# Patient Record
Sex: Female | Born: 1975 | Race: Black or African American | Hispanic: No | Marital: Single | Smoking: Current every day smoker
Health system: Southern US, Community
[De-identification: ages and names within clinical notes are randomized; demographics above are authoritative.]

## PROBLEM LIST (undated history)

## (undated) DIAGNOSIS — C801 Malignant (primary) neoplasm, unspecified: Secondary | ICD-10-CM

## (undated) DIAGNOSIS — G479 Sleep disorder, unspecified: Secondary | ICD-10-CM

## (undated) DIAGNOSIS — K259 Gastric ulcer, unspecified as acute or chronic, without hemorrhage or perforation: Secondary | ICD-10-CM

## (undated) DIAGNOSIS — Z9289 Personal history of other medical treatment: Secondary | ICD-10-CM

## (undated) DIAGNOSIS — J449 Chronic obstructive pulmonary disease, unspecified: Secondary | ICD-10-CM

## (undated) DIAGNOSIS — R200 Anesthesia of skin: Secondary | ICD-10-CM

## (undated) DIAGNOSIS — F32A Depression, unspecified: Secondary | ICD-10-CM

## (undated) DIAGNOSIS — IMO0001 Reserved for inherently not codable concepts without codable children: Secondary | ICD-10-CM

## (undated) DIAGNOSIS — Z8489 Family history of other specified conditions: Secondary | ICD-10-CM

## (undated) DIAGNOSIS — D649 Anemia, unspecified: Secondary | ICD-10-CM

## (undated) DIAGNOSIS — F329 Major depressive disorder, single episode, unspecified: Secondary | ICD-10-CM

## (undated) DIAGNOSIS — J45909 Unspecified asthma, uncomplicated: Secondary | ICD-10-CM

## (undated) DIAGNOSIS — Z9889 Other specified postprocedural states: Secondary | ICD-10-CM

## (undated) DIAGNOSIS — R112 Nausea with vomiting, unspecified: Secondary | ICD-10-CM

## (undated) DIAGNOSIS — R202 Paresthesia of skin: Secondary | ICD-10-CM

## (undated) HISTORY — PX: DILATION AND CURETTAGE OF UTERUS: SHX78

## (undated) HISTORY — PX: TONSILLECTOMY: SUR1361

## (undated) HISTORY — PX: CERVICAL CONE BIOPSY: SUR198

## (undated) HISTORY — PX: LYMPHADENECTOMY: SHX5960

## (undated) HISTORY — PX: SHOULDER SURGERY: SHX246

---
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
## 1999-05-29 ENCOUNTER — Inpatient Hospital Stay (HOSPITAL_COMMUNITY): Admission: AD | Admit: 1999-05-29 | Discharge: 1999-05-29 | Payer: Self-pay | Admitting: *Deleted

## 1999-09-12 ENCOUNTER — Emergency Department (HOSPITAL_COMMUNITY): Admission: EM | Admit: 1999-09-12 | Discharge: 1999-09-12 | Payer: Self-pay | Admitting: Emergency Medicine

## 1999-09-14 ENCOUNTER — Emergency Department (HOSPITAL_COMMUNITY): Admission: EM | Admit: 1999-09-14 | Discharge: 1999-09-14 | Payer: Self-pay | Admitting: Emergency Medicine

## 1999-09-15 ENCOUNTER — Encounter: Payer: Self-pay | Admitting: Emergency Medicine

## 1999-09-28 ENCOUNTER — Emergency Department (HOSPITAL_COMMUNITY): Admission: EM | Admit: 1999-09-28 | Discharge: 1999-09-28 | Payer: Self-pay | Admitting: Emergency Medicine

## 2000-06-07 ENCOUNTER — Emergency Department (HOSPITAL_COMMUNITY): Admission: EM | Admit: 2000-06-07 | Discharge: 2000-06-07 | Payer: Self-pay | Admitting: Emergency Medicine

## 2005-07-30 ENCOUNTER — Emergency Department (HOSPITAL_COMMUNITY): Admission: EM | Admit: 2005-07-30 | Discharge: 2005-07-31 | Payer: Self-pay | Admitting: Emergency Medicine

## 2007-07-04 HISTORY — PX: CHOLECYSTECTOMY: SHX55

## 2011-07-11 DIAGNOSIS — J45909 Unspecified asthma, uncomplicated: Secondary | ICD-10-CM | POA: Diagnosis not present

## 2011-07-11 DIAGNOSIS — R1110 Vomiting, unspecified: Secondary | ICD-10-CM | POA: Diagnosis not present

## 2012-04-26 DIAGNOSIS — F17200 Nicotine dependence, unspecified, uncomplicated: Secondary | ICD-10-CM | POA: Diagnosis not present

## 2012-04-26 DIAGNOSIS — R51 Headache: Secondary | ICD-10-CM | POA: Diagnosis not present

## 2012-06-27 DIAGNOSIS — M546 Pain in thoracic spine: Secondary | ICD-10-CM | POA: Diagnosis not present

## 2012-06-27 DIAGNOSIS — J45909 Unspecified asthma, uncomplicated: Secondary | ICD-10-CM | POA: Diagnosis not present

## 2012-06-27 DIAGNOSIS — M549 Dorsalgia, unspecified: Secondary | ICD-10-CM | POA: Diagnosis not present

## 2012-07-15 DIAGNOSIS — J45909 Unspecified asthma, uncomplicated: Secondary | ICD-10-CM | POA: Diagnosis not present

## 2012-07-15 DIAGNOSIS — F17200 Nicotine dependence, unspecified, uncomplicated: Secondary | ICD-10-CM | POA: Diagnosis not present

## 2012-07-15 DIAGNOSIS — J441 Chronic obstructive pulmonary disease with (acute) exacerbation: Secondary | ICD-10-CM | POA: Diagnosis not present

## 2012-09-26 DIAGNOSIS — K5730 Diverticulosis of large intestine without perforation or abscess without bleeding: Secondary | ICD-10-CM | POA: Diagnosis not present

## 2012-09-26 DIAGNOSIS — K5732 Diverticulitis of large intestine without perforation or abscess without bleeding: Secondary | ICD-10-CM | POA: Diagnosis not present

## 2012-09-26 DIAGNOSIS — J45909 Unspecified asthma, uncomplicated: Secondary | ICD-10-CM | POA: Diagnosis not present

## 2012-10-01 DIAGNOSIS — D649 Anemia, unspecified: Secondary | ICD-10-CM | POA: Diagnosis not present

## 2012-10-01 DIAGNOSIS — Z888 Allergy status to other drugs, medicaments and biological substances status: Secondary | ICD-10-CM | POA: Diagnosis not present

## 2012-10-01 DIAGNOSIS — J45909 Unspecified asthma, uncomplicated: Secondary | ICD-10-CM | POA: Diagnosis not present

## 2012-10-01 DIAGNOSIS — R112 Nausea with vomiting, unspecified: Secondary | ICD-10-CM | POA: Diagnosis not present

## 2012-10-01 DIAGNOSIS — R143 Flatulence: Secondary | ICD-10-CM | POA: Diagnosis not present

## 2012-10-01 DIAGNOSIS — Z8541 Personal history of malignant neoplasm of cervix uteri: Secondary | ICD-10-CM | POA: Diagnosis not present

## 2012-10-01 DIAGNOSIS — F17200 Nicotine dependence, unspecified, uncomplicated: Secondary | ICD-10-CM | POA: Diagnosis not present

## 2012-10-01 DIAGNOSIS — N83209 Unspecified ovarian cyst, unspecified side: Secondary | ICD-10-CM | POA: Diagnosis not present

## 2012-10-01 DIAGNOSIS — R141 Gas pain: Secondary | ICD-10-CM | POA: Diagnosis not present

## 2012-10-01 DIAGNOSIS — K5730 Diverticulosis of large intestine without perforation or abscess without bleeding: Secondary | ICD-10-CM | POA: Diagnosis not present

## 2012-12-05 DIAGNOSIS — Z0489 Encounter for examination and observation for other specified reasons: Secondary | ICD-10-CM | POA: Diagnosis not present

## 2012-12-05 DIAGNOSIS — J45909 Unspecified asthma, uncomplicated: Secondary | ICD-10-CM | POA: Diagnosis not present

## 2012-12-05 DIAGNOSIS — S139XXA Sprain of joints and ligaments of unspecified parts of neck, initial encounter: Secondary | ICD-10-CM | POA: Diagnosis not present

## 2012-12-05 DIAGNOSIS — M542 Cervicalgia: Secondary | ICD-10-CM | POA: Diagnosis not present

## 2012-12-06 DIAGNOSIS — Z0489 Encounter for examination and observation for other specified reasons: Secondary | ICD-10-CM | POA: Diagnosis not present

## 2012-12-16 DIAGNOSIS — M79609 Pain in unspecified limb: Secondary | ICD-10-CM | POA: Diagnosis not present

## 2012-12-16 DIAGNOSIS — F17200 Nicotine dependence, unspecified, uncomplicated: Secondary | ICD-10-CM | POA: Diagnosis not present

## 2012-12-16 DIAGNOSIS — Z79899 Other long term (current) drug therapy: Secondary | ICD-10-CM | POA: Diagnosis not present

## 2012-12-16 DIAGNOSIS — R0609 Other forms of dyspnea: Secondary | ICD-10-CM | POA: Diagnosis not present

## 2012-12-16 DIAGNOSIS — R062 Wheezing: Secondary | ICD-10-CM | POA: Diagnosis not present

## 2012-12-16 DIAGNOSIS — T7411XA Adult physical abuse, confirmed, initial encounter: Secondary | ICD-10-CM | POA: Diagnosis not present

## 2012-12-16 DIAGNOSIS — R0989 Other specified symptoms and signs involving the circulatory and respiratory systems: Secondary | ICD-10-CM | POA: Diagnosis not present

## 2012-12-16 DIAGNOSIS — Z8541 Personal history of malignant neoplasm of cervix uteri: Secondary | ICD-10-CM | POA: Diagnosis not present

## 2012-12-16 DIAGNOSIS — M25539 Pain in unspecified wrist: Secondary | ICD-10-CM | POA: Diagnosis not present

## 2012-12-16 DIAGNOSIS — J45901 Unspecified asthma with (acute) exacerbation: Secondary | ICD-10-CM | POA: Diagnosis not present

## 2012-12-24 DIAGNOSIS — J45909 Unspecified asthma, uncomplicated: Secondary | ICD-10-CM | POA: Diagnosis not present

## 2012-12-24 DIAGNOSIS — D509 Iron deficiency anemia, unspecified: Secondary | ICD-10-CM | POA: Diagnosis not present

## 2012-12-24 DIAGNOSIS — D649 Anemia, unspecified: Secondary | ICD-10-CM | POA: Diagnosis not present

## 2013-02-26 ENCOUNTER — Emergency Department (HOSPITAL_COMMUNITY)
Admission: EM | Admit: 2013-02-26 | Discharge: 2013-02-26 | Disposition: A | Discharge status: Home / Self Care | Payer: Medicare Other | Attending: Emergency Medicine | Admitting: Emergency Medicine

## 2013-02-26 ENCOUNTER — Encounter (HOSPITAL_COMMUNITY): Payer: Self-pay | Admitting: Emergency Medicine

## 2013-02-26 DIAGNOSIS — F17200 Nicotine dependence, unspecified, uncomplicated: Secondary | ICD-10-CM | POA: Insufficient documentation

## 2013-02-26 DIAGNOSIS — R209 Unspecified disturbances of skin sensation: Secondary | ICD-10-CM | POA: Diagnosis not present

## 2013-02-26 DIAGNOSIS — L819 Disorder of pigmentation, unspecified: Secondary | ICD-10-CM | POA: Insufficient documentation

## 2013-02-26 DIAGNOSIS — Z79899 Other long term (current) drug therapy: Secondary | ICD-10-CM | POA: Insufficient documentation

## 2013-02-26 DIAGNOSIS — J45909 Unspecified asthma, uncomplicated: Secondary | ICD-10-CM | POA: Insufficient documentation

## 2013-02-26 DIAGNOSIS — M79609 Pain in unspecified limb: Secondary | ICD-10-CM | POA: Insufficient documentation

## 2013-02-26 DIAGNOSIS — Z880 Allergy status to penicillin: Secondary | ICD-10-CM | POA: Insufficient documentation

## 2013-02-26 DIAGNOSIS — M79606 Pain in leg, unspecified: Secondary | ICD-10-CM

## 2013-02-26 HISTORY — DX: Personal history of other medical treatment: Z92.89

## 2013-02-26 HISTORY — DX: Malignant (primary) neoplasm, unspecified: C80.1

## 2013-02-26 HISTORY — DX: Chronic obstructive pulmonary disease, unspecified: J44.9

## 2013-02-26 HISTORY — DX: Anemia, unspecified: D64.9

## 2013-02-26 HISTORY — DX: Unspecified asthma, uncomplicated: J45.909

## 2013-02-26 HISTORY — DX: Gastric ulcer, unspecified as acute or chronic, without hemorrhage or perforation: K25.9

## 2013-02-26 LAB — CBC WITH DIFFERENTIAL/PLATELET
Eosinophils Absolute: 0.2 10*3/uL (ref 0.0–0.7)
Eosinophils Relative: 3 % (ref 0–5)
HCT: 28.6 % — ABNORMAL LOW (ref 36.0–46.0)
Hemoglobin: 8.5 g/dL — ABNORMAL LOW (ref 12.0–15.0)
Lymphs Abs: 2.7 10*3/uL (ref 0.7–4.0)
MCHC: 29.7 g/dL — ABNORMAL LOW (ref 30.0–36.0)
Monocytes Relative: 8 % (ref 3–12)
Neutro Abs: 3.3 10*3/uL (ref 1.7–7.7)

## 2013-02-26 LAB — BASIC METABOLIC PANEL
BUN: 7 mg/dL (ref 6–23)
CO2: 25 mEq/L (ref 19–32)
Calcium: 8.6 mg/dL (ref 8.4–10.5)
GFR calc Af Amer: >90 mL/min (ref >90)
Glucose, Bld: 94 mg/dL (ref 70–99)

## 2013-02-26 MED ORDER — OXYCODONE HCL 5 MG PO TABS: 5.0000 mg | ORAL_TABLET | ORAL | Status: DC | PRN

## 2013-02-26 MED ORDER — OXYCODONE HCL 5 MG PO TABS
5.0000 mg | ORAL_TABLET | Freq: Once | ORAL | Status: AC
  Administered 2013-02-26: 5 mg via ORAL
  Filled 2013-02-26: qty 1

## 2013-02-26 NOTE — ED Notes (Signed)
PA @ bedside.

## 2013-02-26 NOTE — ED Notes (Signed)
Pt c/o L upper leg pain and bruising x 2 weeks with numbness and tingling. Pt denies injury to extremity. Pt states R thigh now has discolored area. A & O, NAD

## 2013-02-26 NOTE — ED Provider Notes (Signed)
CSN: [id number]     Arrival date & time 02/26/13  2019 History   First MD Initiated Contact with Patient 02/26/13 2117     Chief Complaint  Patient presents with  . Leg Pain   (Consider location/radiation/quality/duration/timing/severity/associated sxs/prior Treatment) The history is provided by medical records and the patient.   Pt presents to the ED for left thigh skin darkening and intermittent, bilateral LE paresthesias x 2 days.  Pt denies any injury, trauma, or falls.  Pt states she noticed the color change when putting on her pants and thinks it is now spreading to her right leg.  Notes only her left anterior thigh is somewhat TTP.  No LE swelling, states leg actually looks "smaller" than normal.  No difficulty weight bearing or ambulating.  No rashes, fevers, sweats, or chills.  No back pain or loss of bowel/bladder function.  Denies any chest pain, SOB, N/V/D.  Pt states she has a hx of anemia requiring blood transfusion.  Past Medical History  Diagnosis Date  . Asthma   . Cancer     cervical  . Anemia   . History of blood transfusion   . Heart valve disorder   . COPD (chronic obstructive pulmonary disease)   . Gastric ulcer     x 5   Past Surgical History  Procedure Laterality Date  . Appendectomy    . Cholecystectomy    . Tonsillectomy    . Cervical cone biopsy      x 6  . Cesarean section      x 2  . Shoulder surgery Left    No family history on file. History  Substance Use Topics  . Smoking status: Current Every Day Smoker  . Smokeless tobacco: Not on file  . Alcohol Use: Yes     Comment: occasional   OB History   Grav Para Term Preterm Abortions TAB SAB Ect Mult Living                 Review of Systems  Musculoskeletal: Positive for myalgias.  Skin: Positive for color change.  All other systems reviewed and are negative.    Allergies  Acetaminophen; Doxycycline; Erythromycin; Motrin; Other; and Penicillins  Home Medications   Current  Outpatient Rx  Name  Route  Sig  Dispense  Refill  . albuterol (PROVENTIL HFA;VENTOLIN HFA) 108 (90 BASE) MCG/ACT inhaler   Inhalation   Inhale 2 puffs into the lungs every 6 (six) hours as needed for wheezing.         Marland Kitchen albuterol (PROVENTIL) (2.5 MG/3ML) 0.083% nebulizer solution   Nebulization   Take 2.5 mg by nebulization every 6 (six) hours as needed for wheezing.          BP 126/73  Pulse 93  Temp(Src) 98.7 F (37.1 C) (Oral)  Resp 16  Ht 5\' 6"  (1.676 m)  Wt 171 lb 4 oz (77.678 kg)  BMI 27.65 kg/m2  SpO2 99%  LMP 02/22/2013  Physical Exam  Nursing note and vitals reviewed. Constitutional: She is oriented to person, place, and time. She appears well-developed and well-nourished.  HENT:  Head: Normocephalic and atraumatic.  Eyes: Conjunctivae and EOM are normal. Pupils are equal, round, and reactive to light.  Neck: Normal range of motion. Neck supple.  Cardiovascular: Normal rate, regular rhythm, normal heart sounds, intact distal pulses and normal pulses.   Pulmonary/Chest: Effort normal and breath sounds normal.  Musculoskeletal: Normal range of motion.  Hyperpigmentation of left anterior thigh, mild  TTP; strong distal pulses bilaterally; no asymmetry or palpable cord; negative Homan's sign; normal pre-patellar reflexes bilaterally; distal sensation intact  Neurological: She is alert and oriented to person, place, and time. She has normal strength and normal reflexes. She displays no tremor. No cranial nerve deficit or sensory deficit. She displays no seizure activity. Gait normal.  CN grossly intact, ambulating appropriately, normal prepatellar reflexes bilaterally, no focal deficits appreciated  Skin: Skin is warm and dry. No rash noted.  No rashes, lesions, petechia, or abscesses  Psychiatric: She has a normal mood and affect. Her speech is normal.    ED Course  Procedures (including critical care time) Labs Review Labs Reviewed  CBC WITH DIFFERENTIAL -  Abnormal; Notable for the following:    Hemoglobin 8.5 (*)    HCT 28.6 (*)    MCV 65.0 (*)    MCH 19.3 (*)    MCHC 29.7 (*)    RDW 19.9 (*)    All other components within normal limits  BASIC METABOLIC PANEL - Abnormal; Notable for the following:    Potassium 3.3 (*)    All other components within normal limits   Imaging Review No results found.  MDM   1. Hyperpigmentation of skin   2. Leg pain, anterior, unspecified laterality    Labs as above, H/H stable.  No back pain, LE neurovascularly intact, reflexes normal.  Doubt acute fx or dislocations.  Sx unlikely for DVT but will schedule for OP doppler in the morning as it is unavailable at this hour.  Rx oxycodone.  Pt does not have current PCP, will [facility] if no improvement in the next few days.  Given strict return precautions should sx change or worsen including numbness, gait disturbance, loss of bowel/bladder function, etc-- pt acknowledged understanding and agreed with plan.  Discussed with Dr. Blinda Leatherwood who agrees with plan.  Garlon Hatchet, PA-C 02/27/13 0006

## 2013-02-28 NOTE — ED Provider Notes (Signed)
Medical screening examination/treatment/procedure(s) were performed by non-physician practitioner and as supervising physician I was immediately available for consultation/collaboration.    Gilda Crease, MD 02/28/13 [phone]

## 2013-04-13 ENCOUNTER — Emergency Department (HOSPITAL_COMMUNITY)
Admission: EM | Admit: 2013-04-13 | Discharge: 2013-04-13 | Disposition: A | Discharge status: Home / Self Care | Payer: Medicare Other | Attending: Emergency Medicine | Admitting: Emergency Medicine

## 2013-04-13 ENCOUNTER — Emergency Department (HOSPITAL_COMMUNITY): Payer: Medicare Other

## 2013-04-13 ENCOUNTER — Encounter (HOSPITAL_COMMUNITY): Payer: Self-pay | Admitting: Emergency Medicine

## 2013-04-13 DIAGNOSIS — Z8719 Personal history of other diseases of the digestive system: Secondary | ICD-10-CM | POA: Insufficient documentation

## 2013-04-13 DIAGNOSIS — J4489 Other specified chronic obstructive pulmonary disease: Secondary | ICD-10-CM | POA: Insufficient documentation

## 2013-04-13 DIAGNOSIS — F17200 Nicotine dependence, unspecified, uncomplicated: Secondary | ICD-10-CM | POA: Diagnosis not present

## 2013-04-13 DIAGNOSIS — Z79899 Other long term (current) drug therapy: Secondary | ICD-10-CM | POA: Diagnosis not present

## 2013-04-13 DIAGNOSIS — Z880 Allergy status to penicillin: Secondary | ICD-10-CM | POA: Diagnosis not present

## 2013-04-13 DIAGNOSIS — G479 Sleep disorder, unspecified: Secondary | ICD-10-CM | POA: Insufficient documentation

## 2013-04-13 DIAGNOSIS — Z8541 Personal history of malignant neoplasm of cervix uteri: Secondary | ICD-10-CM | POA: Insufficient documentation

## 2013-04-13 DIAGNOSIS — Z862 Personal history of diseases of the blood and blood-forming organs and certain disorders involving the immune mechanism: Secondary | ICD-10-CM | POA: Diagnosis not present

## 2013-04-13 DIAGNOSIS — R5381 Other malaise: Secondary | ICD-10-CM | POA: Insufficient documentation

## 2013-04-13 DIAGNOSIS — J449 Chronic obstructive pulmonary disease, unspecified: Secondary | ICD-10-CM | POA: Insufficient documentation

## 2013-04-13 DIAGNOSIS — M542 Cervicalgia: Secondary | ICD-10-CM | POA: Insufficient documentation

## 2013-04-13 DIAGNOSIS — R202 Paresthesia of skin: Secondary | ICD-10-CM

## 2013-04-13 DIAGNOSIS — Z8679 Personal history of other diseases of the circulatory system: Secondary | ICD-10-CM | POA: Diagnosis not present

## 2013-04-13 DIAGNOSIS — R209 Unspecified disturbances of skin sensation: Secondary | ICD-10-CM | POA: Diagnosis not present

## 2013-04-13 DIAGNOSIS — R918 Other nonspecific abnormal finding of lung field: Secondary | ICD-10-CM | POA: Diagnosis not present

## 2013-04-13 LAB — CBC WITH DIFFERENTIAL/PLATELET
Basophils Absolute: 0 10*3/uL (ref 0.0–0.1)
Eosinophils Relative: 2 % (ref 0–5)
MCH: 19.3 pg — ABNORMAL LOW (ref 26.0–34.0)
Monocytes Relative: 9 % (ref 3–12)
Neutrophils Relative %: 49 % (ref 43–77)
Platelets: 138 10*3/uL — ABNORMAL LOW (ref 150–400)
WBC: 6.8 10*3/uL (ref 4.0–10.5)

## 2013-04-13 LAB — BASIC METABOLIC PANEL
CO2: 22 mEq/L (ref 19–32)
Calcium: 7.9 mg/dL — ABNORMAL LOW (ref 8.4–10.5)
Chloride: 105 mEq/L (ref 96–112)
Creatinine, Ser: 0.62 mg/dL (ref 0.50–1.10)

## 2013-04-13 MED ORDER — OXYCODONE HCL 5 MG PO TABS
5.0000 mg | ORAL_TABLET | Freq: Once | ORAL | Status: AC
  Administered 2013-04-13: 5 mg via ORAL
  Filled 2013-04-13: qty 1

## 2013-04-13 MED ORDER — OXYCODONE HCL 5 MG PO TABS: 5.0000 mg | ORAL_TABLET | ORAL | Status: DC | PRN

## 2013-04-13 NOTE — ED Notes (Addendum)
Pt c/o pain in left wrist and thumb area.  No known injury no swelling present.  + strong radial pulse.

## 2013-04-13 NOTE — ED Notes (Signed)
C/o L arm pain, no known injury, onset ~ 1 week ago, radiates down arm, reports fingers are tingling and fingers turn blue when the pain comes, pain is constant, but fluctuates. Radial ulnar and interphalangeal pusles present, hands equally cool bilaterally, mentions neck pain when she turns neck, some intermitent nausea.

## 2013-04-13 NOTE — Progress Notes (Signed)
Orthopedic Tech Progress Note Patient Details:  Melissa Alvarez 1975-12-29 [id number]  Ortho Devices Type of Ortho Device: Arm sling;Velcro wrist splint Ortho Device/Splint Location: lue Ortho Device/Splint Interventions: Application   Keil Pickering 04/13/2013, 10:52 PM

## 2013-04-13 NOTE — ED Provider Notes (Signed)
CSN: [id number]     Arrival date & time 04/13/13  1937 History  This chart was scribed for Rhea Bleacher, PA, working with Geoffery Lyons, MD by Blanchard Kelch, ED Scribe. This patient was seen in room TR10C/TR10C and the patient's care was started at 7:58 PM.    Chief Complaint  Patient presents with  . Arm Pain    Patient is a 37 y.o. female presenting with arm pain. The history is provided by the patient. No language interpreter was used.  Arm Pain Pertinent negatives include no chest pain, no headaches and no shortness of breath.     HPI Comments: Melissa Alvarez is a 37 y.o. female who presents to the Emergency Department complaining of gradual onset, waxing and waning left arm pain that began about a week ago. She states the pain started in her upper left arm but is now in the lower left arm and hand. She complains of mild neck pain and numbness and tingling in her left hand. She states that her fingers turn blue during episodes of pain. She has been using Tylenol, Motrin and Advil for the pain without relief. She has been unable to sleep due to the pain. She denies any injury to the affected area. She has a pertinent past medical history of five gastric ulcers. She denies a history of blood clots.   She is also complaining of spreading left thigh darkening with pain. She states that she gets intermittent weakness and numbness in her left leg when she walks. Her husband states she has difficulty lifting up or feeling her leg occasionally. These symptoms first began about a month ago. She was seen in the ED on 8/27 for the same symptoms but was discharged. She was also seen in an ED in [place] but was told the discoloration was just a bruise.  Pt denies having a PCP currently.  Past Medical History  Diagnosis Date  . Asthma   . Cancer     cervical  . Anemia   . History of blood transfusion   . Heart valve disorder   . COPD (chronic obstructive pulmonary disease)   . Gastric ulcer      x 5   Past Surgical History  Procedure Laterality Date  . Appendectomy    . Cholecystectomy    . Tonsillectomy    . Cervical cone biopsy      x 6  . Cesarean section      x 2  . Shoulder surgery Left    No family history on file. History  Substance Use Topics  . Smoking status: Current Every Day Smoker  . Smokeless tobacco: Not on file  . Alcohol Use: Yes     Comment: occasional   OB History   Grav Para Term Preterm Abortions TAB SAB Ect Mult Living                 Review of Systems  Constitutional: Negative for fever.  HENT: Negative for congestion, dental problem, rhinorrhea and sinus pressure.   Eyes: Negative for photophobia, discharge, redness and visual disturbance.  Respiratory: Negative for shortness of breath.   Cardiovascular: Negative for chest pain.  Gastrointestinal: Negative for nausea and vomiting.  Musculoskeletal: Positive for myalgias (left arm pain) and neck pain. Negative for gait problem and neck stiffness.  Skin: Negative for rash.  Neurological: Positive for weakness (left leg) and numbness (left hand, left leg). Negative for syncope, speech difficulty, light-headedness and headaches.  Psychiatric/Behavioral: Positive  for sleep disturbance. Negative for confusion.    Allergies  Acetaminophen; Doxycycline; Erythromycin; Motrin; Other; and Penicillins  Home Medications   Current Outpatient Rx  Name  Route  Sig  Dispense  Refill  . albuterol (PROVENTIL HFA;VENTOLIN HFA) 108 (90 BASE) MCG/ACT inhaler   Inhalation   Inhale 2 puffs into the lungs every 6 (six) hours as needed for wheezing.         Marland Kitchen albuterol (PROVENTIL) (2.5 MG/3ML) 0.083% nebulizer solution   Nebulization   Take 2.5 mg by nebulization every 6 (six) hours as needed for wheezing.         Marland Kitchen oxyCODONE (OXY IR/ROXICODONE) 5 MG immediate release tablet   Oral   Take 1 tablet (5 mg total) by mouth every 4 (four) hours as needed for pain.   10 tablet   0    Triage Vitals:  BP 131/64  Pulse 81  Temp(Src) 98 F (36.7 C) (Oral)  Resp 18  Ht 5\' 6"  (1.676 m)  Wt 170 lb (77.111 kg)  BMI 27.45 kg/m2  SpO2 98%  LMP 03/17/2013  Physical Exam  Nursing note and vitals reviewed. Constitutional: She is oriented to person, place, and time. She appears well-developed and well-nourished.  HENT:  Head: Normocephalic and atraumatic.  Right Ear: Tympanic membrane, external ear and ear canal normal.  Left Ear: Tympanic membrane, external ear and ear canal normal.  Nose: Nose normal.  Mouth/Throat: Uvula is midline, oropharynx is clear and moist and mucous membranes are normal.  Eyes: Conjunctivae, EOM and lids are normal. Pupils are equal, round, and reactive to light. Right eye exhibits no nystagmus. Left eye exhibits no nystagmus.  Neck: Normal range of motion. Neck supple.  There is a right cervical lymph node that is firm, mobile. Patient states this has been present for 2 weeks.  Cardiovascular: Normal rate and regular rhythm.   Pulmonary/Chest: Effort normal and breath sounds normal.  Abdominal: Soft. There is no tenderness.  Musculoskeletal:       Cervical back: She exhibits tenderness. She exhibits normal range of motion and no bony tenderness.  Positive Tinel's sign of left wrist. Patient very tender with any movement of left hand, especially thenar eminence. Her left hand and forearm are cool to the touch when compared to the right, however 2+ radial pulse noted in left wrist with normal capillary refill. No swelling. Hyperesthesia of left forearm. Patient complains of generalized tenderness of left shoulder. Slightly weak grip strength.  Lymphadenopathy:    She has cervical adenopathy.  Neurological: She is alert and oriented to person, place, and time. She has normal strength and normal reflexes. No cranial nerve deficit or sensory deficit. She displays a negative Romberg sign. Coordination and gait normal. GCS eye subscore is 4. GCS verbal subscore is 5. GCS  motor subscore is 6.  Skin: Skin is warm and dry.  Poorly defined area on anterior lateral left thigh of mild hyperpigmentation.  Psychiatric: She has a normal mood and affect.    ED Course  Procedures (including critical care time)  DIAGNOSTIC STUDIES: Oxygen Saturation is 98% on room air, normal by my interpretation.    COORDINATION OF CARE: 8:01 PM -Will consult with Dr. Judd Lien for treatment plan. Will order CBC, BMP and Cervical spine MRI without contrast. Patient requested for wrist to be splinted and a sling for the arm to decrease pain from motion. Patient verbalizes understanding and agrees with treatment plan.  Labs Review Labs Reviewed  CBC WITH DIFFERENTIAL -  Abnormal; Notable for the following:    Hemoglobin 9.3 (*)    HCT 30.3 (*)    MCV 63.0 (*)    MCH 19.3 (*)    RDW 19.5 (*)    Platelets 138 (*)    All other components within normal limits  BASIC METABOLIC PANEL - Abnormal; Notable for the following:    Potassium 3.3 (*)    Glucose, Bld 102 (*)    Calcium 7.9 (*)    All other components within normal limits   Imaging Review Dg Chest 2 View  04/13/2013   CLINICAL DATA:  Gradual onset of waxing and waning left arm pain that began 1 week ago, beginning in upper arm, But now in lower arm and hand. Mild neck pain. No chest complaints.  EXAM: CHEST  2 VIEW  COMPARISON:  Chest x-ray from [facility] 07/13/2007.  FINDINGS: The heart size and mediastinal contours are within normal limits. Both lungs are clear. The visualized skeletal structures are unremarkable. Prior cholecystectomy with right upper quadrant clips.  IMPRESSION: No active cardiopulmonary disease. No change from priors.   Electronically Signed   By: Davonna Belling M.D.   On: 04/13/2013 22:28    EKG Interpretation   None      Patient discussed with Dr. Judd Lien.   Basic labs and chest x-ray ordered. Unfortunately MRI cervical spine unable to be obtained tonight.  Patient seen by Dr. Judd Lien.  Will give pain medicine for home and orthopedic followup. Patient counseled to return with worsening symptoms, worsening weakness, inability to walk, severe neck pain, or she has any other concerns. Patient verbalizes understanding and agrees with plan.  Patient counseled on use of narcotic pain medications. Counseled not to combine these medications with others containing tylenol. Urged not to drink alcohol, drive, or perform any other activities that requires focus while taking these medications. The patient verbalizes understanding and agrees with the plan.     MDM   1. Left leg paresthesias   2. Paresthesias in left hand    Patient with unusual constellation of symptoms. Left leg numbness has been intermittent for several months. Over the past week she has developed left arm paresthesias and possible vasomotor symptoms. Thoracic outlet syndrome is a possibility. Chest x-ray does not show any rib deformities. Cervical radiculopathy is also on the differential. If it was possible, I would've obtained cervical spine MRI tonight to rule out cord etiology. However this is unable to be obtained and I do not feel that this needs to be done emergently. My suspicion for central cord etiology requiring emergent intervention is very very low. However this would need to be entertained especially since patient has lower extremity symptoms as well. At this point, I feel the patient can be discharged home with orthopedic followup. Referral given. Patient given sling and splint for pain control. Appropriate return instructions given.  Primary care referral also given. Uncertain significance of right cervical lymph node. This would ideally be followed by the patient's primary care physician. CBC with normal WBC count.  I personally performed the services described in this documentation, which was scribed in my presence. The recorded information has been reviewed and is accurate.    Renne Crigler,  PA-C 04/13/13 2330

## 2013-04-16 NOTE — ED Provider Notes (Signed)
Medical screening examination/treatment/procedure(s) were conducted as a shared visit with non-physician practitioner(s) and myself.  I personally evaluated the patient during the encounter.  Patient is a 37 year old female with past medical history significant for anemia and COPD. She presents today with complaints of intermittent pain in her left arm that started about a week ago. Radiates into her left arm and hand. She is also complaining of pain in her neck and pain in her left thigh. She reports there is a swollen knot on the right side of her neck that when pushed reproduces her pain. She denies any urinary complaints. She denies any weakness or numbness. Patient recently relocated here from [place]. She tells me she had this complaints evaluated while she was there however no cause was found.  On exam the patient is afebrile vitals are stable. She is awake alert and appropriate. Heart is regular rate and rhythm lungs are clear. The left upper extremity appears grossly normal without deformity. She is tenderness to palpation over the left wrist, however distal pulses are palpable capillary refill is brisk. Strength is 5 out of 5 bilaterally. She is noted to have a 1 cm cervical lymph node that is tender to palpation.  Complaints and physical examination are difficult to attribute to any one specific disease process. We will treat with anti-inflammatory and pain medications she is not improving in the next several days we have recommended followup with orthopedics to discuss further testing. I am uncertain if she has possibly a pinched nerve in her neck or other process, however nothing appears emergent. She will be discharged to home with the above followup as needed.    Geoffery Lyons, MD 04/16/13 [phone]

## 2013-05-14 ENCOUNTER — Encounter (HOSPITAL_COMMUNITY): Payer: Self-pay | Admitting: Emergency Medicine

## 2013-05-14 ENCOUNTER — Emergency Department (HOSPITAL_COMMUNITY): Payer: Medicare Other

## 2013-05-14 ENCOUNTER — Emergency Department (HOSPITAL_COMMUNITY)
Admission: EM | Admit: 2013-05-14 | Discharge: 2013-05-14 | Disposition: A | Discharge status: Home / Self Care | Payer: Medicare Other | Attending: Emergency Medicine | Admitting: Emergency Medicine

## 2013-05-14 DIAGNOSIS — M79609 Pain in unspecified limb: Secondary | ICD-10-CM | POA: Diagnosis not present

## 2013-05-14 DIAGNOSIS — R0609 Other forms of dyspnea: Secondary | ICD-10-CM | POA: Insufficient documentation

## 2013-05-14 DIAGNOSIS — F17200 Nicotine dependence, unspecified, uncomplicated: Secondary | ICD-10-CM | POA: Diagnosis not present

## 2013-05-14 DIAGNOSIS — R079 Chest pain, unspecified: Secondary | ICD-10-CM | POA: Insufficient documentation

## 2013-05-14 DIAGNOSIS — R5381 Other malaise: Secondary | ICD-10-CM | POA: Insufficient documentation

## 2013-05-14 DIAGNOSIS — R531 Weakness: Secondary | ICD-10-CM

## 2013-05-14 DIAGNOSIS — J45909 Unspecified asthma, uncomplicated: Secondary | ICD-10-CM | POA: Insufficient documentation

## 2013-05-14 DIAGNOSIS — R209 Unspecified disturbances of skin sensation: Secondary | ICD-10-CM | POA: Diagnosis not present

## 2013-05-14 DIAGNOSIS — I38 Endocarditis, valve unspecified: Secondary | ICD-10-CM | POA: Diagnosis not present

## 2013-05-14 DIAGNOSIS — R0989 Other specified symptoms and signs involving the circulatory and respiratory systems: Secondary | ICD-10-CM | POA: Insufficient documentation

## 2013-05-14 LAB — URINALYSIS, ROUTINE W REFLEX MICROSCOPIC
Glucose, UA: NEGATIVE mg/dL
Protein, ur: NEGATIVE mg/dL
pH: 6.5 (ref 5.0–8.0)

## 2013-05-14 LAB — CBC WITH DIFFERENTIAL/PLATELET
Basophils Absolute: 0 10*3/uL (ref 0.0–0.1)
Basophils Relative: 0 % (ref 0–1)
Eosinophils Relative: 3 % (ref 0–5)
Hemoglobin: 8.1 g/dL — ABNORMAL LOW (ref 12.0–15.0)
Lymphocytes Relative: 36 % (ref 12–46)
MCHC: 30.2 g/dL (ref 30.0–36.0)
Neutro Abs: 4.3 10*3/uL (ref 1.7–7.7)
Neutrophils Relative %: 53 % (ref 43–77)
RBC: 4.19 MIL/uL (ref 3.87–5.11)
RDW: 20.6 % — ABNORMAL HIGH (ref 11.5–15.5)

## 2013-05-14 LAB — COMPREHENSIVE METABOLIC PANEL
Albumin: 3.1 g/dL — ABNORMAL LOW (ref 3.5–5.2)
BUN: 9 mg/dL (ref 6–23)
CO2: 22 mEq/L (ref 19–32)
Chloride: 105 mEq/L (ref 96–112)
Glucose, Bld: 70 mg/dL (ref 70–99)
Potassium: 3.5 mEq/L (ref 3.5–5.1)
Total Bilirubin: 0.2 mg/dL — ABNORMAL LOW (ref 0.3–1.2)

## 2013-05-14 LAB — URINE MICROSCOPIC-ADD ON

## 2013-05-14 LAB — POCT I-STAT TROPONIN I: Troponin i, poc: 0 ng/mL (ref 0.00–0.08)

## 2013-05-14 MED ORDER — ALBUTEROL SULFATE (5 MG/ML) 0.5% IN NEBU
2.5000 mg | INHALATION_SOLUTION | RESPIRATORY_TRACT | Status: DC
  Administered 2013-05-14: 2.5 mg via RESPIRATORY_TRACT
  Filled 2013-05-14: qty 0.5

## 2013-05-14 MED ORDER — IPRATROPIUM BROMIDE 0.02 % IN SOLN
0.5000 mg | RESPIRATORY_TRACT | Status: DC
  Administered 2013-05-14: 0.5 mg via RESPIRATORY_TRACT
  Filled 2013-05-14: qty 2.5

## 2013-05-14 MED ORDER — PREDNISONE 20 MG PO TABS
60.0000 mg | ORAL_TABLET | Freq: Once | ORAL | Status: DC
  Filled 2013-05-14: qty 3

## 2013-05-14 MED ORDER — MORPHINE SULFATE 4 MG/ML IJ SOLN: 4.0000 mg | Freq: Once | INTRAMUSCULAR | Status: DC

## 2013-05-14 MED ORDER — OXYCODONE HCL 5 MG PO TABS: 5.0000 mg | ORAL_TABLET | Freq: Four times a day (QID) | ORAL | Status: DC | PRN

## 2013-05-14 MED ORDER — OXYCODONE HCL 5 MG PO TABS
5.0000 mg | ORAL_TABLET | Freq: Once | ORAL | Status: AC
  Administered 2013-05-14: 5 mg via ORAL
  Filled 2013-05-14: qty 1

## 2013-05-14 NOTE — ED Provider Notes (Signed)
CSN: [id number]     Arrival date & time 05/14/13  1545 History   First MD Initiated Contact with Patient 05/14/13 1855     Chief Complaint  Patient presents with  . numbness lt side of body 2 months    (Consider location/radiation/quality/duration/timing/severity/associated sxs/prior Treatment) HPI  37 year old female here with left-sided weakness and numbness for the last 2 months. She states that her symptoms came on gradually about 2 months ago and got worse since then. She was seen in the ED about a month ago and was told to have an MRI outpatient which she could not get. She also notes left thigh pain that radiates upward into her left groin. She also notes exertional dyspnea and chest pain for the last 2 months. She describes her chest pain is left-sided chest pressure worsening with exertion and alleviated with rest. She has a history of asthma but does not have an inhaler at home. She had a fever of 102, two days ago.  She also notes heavy periods lasting 4-7 days every 4 weeks requiring 20 tampons and pads daily.   Past Medical History  Diagnosis Date  . Asthma   . Cancer     cervical  . Anemia   . History of blood transfusion   . Heart valve disorder   . COPD (chronic obstructive pulmonary disease)   . Gastric ulcer     x 5   Past Surgical History  Procedure Laterality Date  . Appendectomy    . Cholecystectomy    . Tonsillectomy    . Cervical cone biopsy      x 6  . Cesarean section      x 2  . Shoulder surgery Left    No family history on file. History  Substance Use Topics  . Smoking status: Current Every Day Smoker  . Smokeless tobacco: Not on file  . Alcohol Use: Yes     Comment: occasional   OB History   Grav Para Term Preterm Abortions TAB SAB Ect Mult Living                 Review of Systems  Constitutional: Positive for fever and chills. Negative for appetite change.  HENT: Negative for congestion and sore throat.   Respiratory: Positive for  cough and shortness of breath.   Cardiovascular: Positive for chest pain.  Gastrointestinal: Negative for abdominal pain and diarrhea.  Genitourinary: Negative for dysuria.  Musculoskeletal: Negative for back pain.  Neurological: Positive for dizziness, weakness and numbness. Negative for speech difficulty and headaches.  All other systems reviewed and are negative.    Allergies  Acetaminophen; Doxycycline; Erythromycin; Motrin; Other; and Penicillins  Home Medications   Current Outpatient Rx  Name  Route  Sig  Dispense  Refill  . acetaminophen (TYLENOL) 325 MG tablet   Oral   Take 325 mg by mouth every 6 (six) hours as needed for pain.         Marland Kitchen albuterol (PROVENTIL HFA;VENTOLIN HFA) 108 (90 BASE) MCG/ACT inhaler   Inhalation   Inhale 2 puffs into the lungs every 6 (six) hours as needed for wheezing.         Marland Kitchen albuterol (PROVENTIL) (2.5 MG/3ML) 0.083% nebulizer solution   Nebulization   Take 2.5 mg by nebulization every 6 (six) hours as needed for wheezing.         . diphenhydrAMINE (SOMINEX) 25 MG tablet   Oral   Take 25 mg by mouth at bedtime as  needed for allergies or sleep.         Marland Kitchen ibuprofen (ADVIL,MOTRIN) 200 MG tablet   Oral   Take 400 mg by mouth every 6 (six) hours as needed for pain.         Marland Kitchen oxyCODONE (OXY IR/ROXICODONE) 5 MG immediate release tablet   Oral   Take 1 tablet (5 mg total) by mouth every 4 (four) hours as needed for pain.   10 tablet   0    BP 116/70  Pulse 79  Temp(Src) 98.8 F (37.1 C) (Oral)  Resp 18  Wt 178 lb 11.2 oz (81.058 kg)  SpO2 99%  LMP 05/14/2013 Physical Exam  Constitutional: She is oriented to person, place, and time. She appears well-developed and well-nourished. No distress.  HENT:  Head: Normocephalic and atraumatic.  Eyes: EOM are normal. Pupils are equal, round, and reactive to light.  Neck: Neck supple.  Cardiovascular: Normal rate, regular rhythm and normal heart sounds.   No murmur  heard. Pulmonary/Chest: Effort normal and breath sounds normal.  Abdominal: Soft. Bowel sounds are normal. There is no tenderness. There is no guarding.  Musculoskeletal: She exhibits no edema.  Neurological: She is alert and oriented to person, place, and time.  Strength 5/5 and sesnation intact on RU &RL extremity, 4/5 strength on L upper and lower extremity Different sensation on L upper adn lower extremity compared to the R.   Skin: Skin is warm and dry. She is not diaphoretic.  Psychiatric: She has a normal mood and affect.    ED Course  Procedures (including critical care time) Labs Review Labs Reviewed  CBC WITH DIFFERENTIAL - Abnormal; Notable for the following:    Hemoglobin 8.1 (*)    HCT 26.8 (*)    MCV 64.0 (*)    MCH 19.3 (*)    RDW 20.6 (*)    All other components within normal limits  COMPREHENSIVE METABOLIC PANEL - Abnormal; Notable for the following:    Calcium 8.2 (*)    Albumin 3.1 (*)    Total Bilirubin 0.2 (*)    All other components within normal limits  URINALYSIS, ROUTINE W REFLEX MICROSCOPIC - Abnormal; Notable for the following:    Hgb urine dipstick LARGE (*)    All other components within normal limits  URINE MICROSCOPIC-ADD ON - Abnormal; Notable for the following:    Squamous Epithelial / LPF FEW (*)    All other components within normal limits  PREGNANCY, URINE   Imaging Review No results found.  EKG Interpretation   None       MDM  No diagnosis found.  49 y//o female with multiple complaints here with 2 months of L sided weakness and numbness. Also notes dyspnea, chest pain, and L thigh pain.   With weakness on L side on neuro exam will CT head to eval for CVA, f/u with PCP for MRI head Duoneb, will Rx albuterol MDI and 4 days of pred if improves with Tx Troponin neg X 1, rules out cardiac chest pain given the chronicity of the pain.  Dyspnea most consistent with asthma exacerbation.   Will sign out to following provider who will  follow up on troponin, CT head, and dyspnea.   Murtis Sink, MD [facility] Resident, PGY-2 05/14/2013, 8:14 PM         Elenora Gamma, MD 05/14/13 2014

## 2013-05-14 NOTE — ED Notes (Signed)
PA at bedside.

## 2013-05-14 NOTE — ED Notes (Signed)
The pt cannot void at present 

## 2013-05-14 NOTE — ED Provider Notes (Signed)
Medical screening examination/treatment/procedure(s) were performed by non-physician practitioner and as supervising physician I was immediately available for consultation/collaboration.  EKG Interpretation   None         Joya Gaskins, MD 05/14/13 2353

## 2013-05-14 NOTE — ED Notes (Signed)
Pt lungs clear after breathing tx. Pt reports relief.

## 2013-05-14 NOTE — ED Provider Notes (Signed)
Patient care assumed from Dr. Elwin Mocha and Dr. Kevin Fenton at shift change with CT pending. Patient presenting for 2 months of mild L sided numbness. Labs today have been unremarkable. Plan, per Dr. Gwendolyn Grant, is to d/c patient with outpatient follow up and neurology referral if CT negative.  CT head negative for intracranial abnormalities today. Have reviewed results with the patient who verbalizes understanding. Have discussed outpatient follow up with neurology and PCP to which patient verbalizes understanding and is agreeable to plan. She is hemodynamically stable and appropriate for d/c with no unaddressed concerns.   Filed Vitals:   05/14/13 1549 05/14/13 1840 05/14/13 2045 05/14/13 2100  BP: 113/80 116/70 109/61 113/58  Pulse: 85 79 83 82  Temp: 98.4 F (36.9 C) 98.8 F (37.1 C)    TempSrc: Oral Oral    Resp: 20 18    Weight: 178 lb 11.2 oz (81.058 kg)     SpO2: 100% 99% 99% 99%    Results for orders placed during the hospital encounter of 05/14/13  CBC WITH DIFFERENTIAL      Result Value Range   WBC 8.0  4.0 - 10.5 K/uL   RBC 4.19  3.87 - 5.11 MIL/uL   Hemoglobin 8.1 (*) 12.0 - 15.0 g/dL   HCT 40.9 (*) 81.1 - 91.4 %   MCV 64.0 (*) 78.0 - 100.0 fL   MCH 19.3 (*) 26.0 - 34.0 pg   MCHC 30.2  30.0 - 36.0 g/dL   RDW 78.2 (*) 95.6 - 21.3 %   Platelets 255  150 - 400 K/uL   Neutrophils Relative % 53  43 - 77 %   Lymphocytes Relative 36  12 - 46 %   Monocytes Relative 8  3 - 12 %   Eosinophils Relative 3  0 - 5 %   Basophils Relative 0  0 - 1 %   Neutro Abs 4.3  1.7 - 7.7 K/uL   Lymphs Abs 2.9  0.7 - 4.0 K/uL   Monocytes Absolute 0.6  0.1 - 1.0 K/uL   Eosinophils Absolute 0.2  0.0 - 0.7 K/uL   Basophils Absolute 0.0  0.0 - 0.1 K/uL   RBC Morphology TARGET CELLS    COMPREHENSIVE METABOLIC PANEL      Result Value Range   Sodium 137  135 - 145 mEq/L   Potassium 3.5  3.5 - 5.1 mEq/L   Chloride 105  96 - 112 mEq/L   CO2 22  19 - 32 mEq/L   Glucose, Bld 70  70 - 99 mg/dL    BUN 9  6 - 23 mg/dL   Creatinine, Ser 0.86  0.50 - 1.10 mg/dL   Calcium 8.2 (*) 8.4 - 10.5 mg/dL   Total Protein 6.3  6.0 - 8.3 g/dL   Albumin 3.1 (*) 3.5 - 5.2 g/dL   AST 17  0 - 37 U/L   ALT 13  0 - 35 U/L   Alkaline Phosphatase 53  39 - 117 U/L   Total Bilirubin 0.2 (*) 0.3 - 1.2 mg/dL   GFR calc non Af Amer >90  >90 mL/min   GFR calc Af Amer >90  >90 mL/min  URINALYSIS, ROUTINE W REFLEX MICROSCOPIC      Result Value Range   Color, Urine YELLOW  YELLOW   APPearance CLEAR  CLEAR   Specific Gravity, Urine 1.021  1.005 - 1.030   pH 6.5  5.0 - 8.0   Glucose, UA NEGATIVE  NEGATIVE mg/dL  Hgb urine dipstick LARGE (*) NEGATIVE   Bilirubin Urine NEGATIVE  NEGATIVE   Ketones, ur NEGATIVE  NEGATIVE mg/dL   Protein, ur NEGATIVE  NEGATIVE mg/dL   Urobilinogen, UA 0.2  0.0 - 1.0 mg/dL   Nitrite NEGATIVE  NEGATIVE   Leukocytes, UA NEGATIVE  NEGATIVE  PREGNANCY, URINE      Result Value Range   Preg Test, Ur NEGATIVE  NEGATIVE  URINE MICROSCOPIC-ADD ON      Result Value Range   Squamous Epithelial / LPF FEW (*) RARE   RBC / HPF TOO NUMEROUS TO COUNT  <3 RBC/hpf   Ct Head Wo Contrast  05/14/2013   CLINICAL DATA:  Weakness.  Left arm pain.  EXAM: CT HEAD WITHOUT CONTRAST  TECHNIQUE: Contiguous axial images were obtained from the base of the skull through the vertex without intravenous contrast.  COMPARISON:  None.  FINDINGS: No acute cortical infarct, hemorrhage, or mass lesion is present. The ventricles are of normal size. No significant extra-axial fluid collection is evident. The paranasal sinuses and mastoid air cells are clear. The osseous skull is intact.  IMPRESSION: Negative CT of the head.   Electronically Signed   By: Gennette Pac M.D.   On: 05/14/2013 20:33      Antony Madura, PA-C 05/14/13 2129

## 2013-05-14 NOTE — ED Notes (Signed)
The pt is c/o  Numbness on the entire lt side of her body for 2 months.  She has a cold.  She was seen here one month ago for the same and she refused admission.  lmp now

## 2013-05-15 NOTE — ED Provider Notes (Signed)
I saw and evaluated the patient, reviewed the resident's note and I agree with the findings and plan.  EKG Interpretation   None       Patient here with 2 months of constant numbness in RUE and RLE. Patient also with some chest pain, SOB, wheezing. Wheezing in lungs, no respiratory distress. 4+/5 in RUE and RLE. Given albuterol, will check CXR and Head CT.   Dagmar Hait, MD 05/15/13 [phone]

## 2013-05-21 ENCOUNTER — Ambulatory Visit: Payer: Medicare Other | Admitting: Neurology

## 2013-05-21 ENCOUNTER — Encounter: Payer: Self-pay | Admitting: Neurology

## 2013-06-11 ENCOUNTER — Ambulatory Visit: Payer: Medicare Other | Admitting: Internal Medicine

## 2013-09-22 ENCOUNTER — Emergency Department (HOSPITAL_COMMUNITY)
Admission: EM | Admit: 2013-09-22 | Discharge: 2013-09-23 | Disposition: A | Discharge status: Home / Self Care | Payer: Medicare Other | Attending: Emergency Medicine | Admitting: Emergency Medicine

## 2013-09-22 ENCOUNTER — Encounter (HOSPITAL_COMMUNITY): Payer: Self-pay | Admitting: Emergency Medicine

## 2013-09-22 ENCOUNTER — Emergency Department (HOSPITAL_COMMUNITY): Payer: Medicare Other

## 2013-09-22 DIAGNOSIS — F17200 Nicotine dependence, unspecified, uncomplicated: Secondary | ICD-10-CM | POA: Insufficient documentation

## 2013-09-22 DIAGNOSIS — R059 Cough, unspecified: Secondary | ICD-10-CM | POA: Diagnosis not present

## 2013-09-22 DIAGNOSIS — Z79899 Other long term (current) drug therapy: Secondary | ICD-10-CM | POA: Diagnosis not present

## 2013-09-22 DIAGNOSIS — R0602 Shortness of breath: Secondary | ICD-10-CM | POA: Diagnosis not present

## 2013-09-22 DIAGNOSIS — R05 Cough: Secondary | ICD-10-CM | POA: Diagnosis not present

## 2013-09-22 DIAGNOSIS — Z862 Personal history of diseases of the blood and blood-forming organs and certain disorders involving the immune mechanism: Secondary | ICD-10-CM | POA: Diagnosis not present

## 2013-09-22 DIAGNOSIS — J45901 Unspecified asthma with (acute) exacerbation: Principal | ICD-10-CM

## 2013-09-22 DIAGNOSIS — Z8541 Personal history of malignant neoplasm of cervix uteri: Secondary | ICD-10-CM | POA: Insufficient documentation

## 2013-09-22 DIAGNOSIS — Z880 Allergy status to penicillin: Secondary | ICD-10-CM | POA: Insufficient documentation

## 2013-09-22 DIAGNOSIS — J441 Chronic obstructive pulmonary disease with (acute) exacerbation: Secondary | ICD-10-CM | POA: Diagnosis not present

## 2013-09-22 DIAGNOSIS — J449 Chronic obstructive pulmonary disease, unspecified: Secondary | ICD-10-CM | POA: Diagnosis not present

## 2013-09-22 DIAGNOSIS — R062 Wheezing: Secondary | ICD-10-CM | POA: Diagnosis not present

## 2013-09-22 DIAGNOSIS — Z8719 Personal history of other diseases of the digestive system: Secondary | ICD-10-CM | POA: Diagnosis not present

## 2013-09-22 DIAGNOSIS — Z8679 Personal history of other diseases of the circulatory system: Secondary | ICD-10-CM | POA: Insufficient documentation

## 2013-09-22 LAB — CBC
HEMATOCRIT: 31.1 % — AB (ref 36.0–46.0)
Hemoglobin: 9.3 g/dL — ABNORMAL LOW (ref 12.0–15.0)
MCH: 19.1 pg — ABNORMAL LOW (ref 26.0–34.0)
MCHC: 29.9 g/dL — ABNORMAL LOW (ref 30.0–36.0)
MCV: 64 fL — AB (ref 78.0–100.0)
Platelets: 227 10*3/uL (ref 150–400)
RBC: 4.86 MIL/uL (ref 3.87–5.11)
RDW: 19.8 % — ABNORMAL HIGH (ref 11.5–15.5)
WBC: 8 10*3/uL (ref 4.0–10.5)

## 2013-09-22 LAB — BASIC METABOLIC PANEL
BUN: 6 mg/dL (ref 6–23)
CO2: 21 meq/L (ref 19–32)
CREATININE: 0.63 mg/dL (ref 0.50–1.10)
Calcium: 9.3 mg/dL (ref 8.4–10.5)
Chloride: 101 mEq/L (ref 96–112)
GFR calc non Af Amer: >90 mL/min (ref >90)
Glucose, Bld: 89 mg/dL (ref 70–99)
POTASSIUM: 3.5 meq/L — AB (ref 3.7–5.3)
Sodium: 138 mEq/L (ref 137–147)

## 2013-09-22 LAB — I-STAT TROPONIN, ED: TROPONIN I, POC: 0 ng/mL (ref 0.00–0.08)

## 2013-09-22 MED ORDER — DIPHENHYDRAMINE HCL 50 MG/ML IJ SOLN
12.5000 mg | Freq: Once | INTRAMUSCULAR | Status: AC
  Administered 2013-09-22: 12.5 mg via INTRAVENOUS
  Filled 2013-09-22: qty 1

## 2013-09-22 MED ORDER — ALBUTEROL (5 MG/ML) CONTINUOUS INHALATION SOLN
INHALATION_SOLUTION | RESPIRATORY_TRACT | Status: AC
  Administered 2013-09-22: 21:00:00 15 mg/h via RESPIRATORY_TRACT
  Filled 2013-09-22: qty 20

## 2013-09-22 MED ORDER — ALBUTEROL SULFATE (2.5 MG/3ML) 0.083% IN NEBU
5.0000 mg | INHALATION_SOLUTION | Freq: Once | RESPIRATORY_TRACT | Status: AC
Start: 2013-09-22 — End: 2013-09-22
  Administered 2013-09-22: 5 mg via RESPIRATORY_TRACT
  Filled 2013-09-22: qty 6

## 2013-09-22 MED ORDER — FENTANYL CITRATE 0.05 MG/ML IJ SOLN
50.0000 ug | Freq: Once | INTRAMUSCULAR | Status: AC
  Administered 2013-09-22: 50 ug via INTRAVENOUS
  Filled 2013-09-22: qty 2

## 2013-09-22 MED ORDER — ALBUTEROL (5 MG/ML) CONTINUOUS INHALATION SOLN
15.0000 mg/h | INHALATION_SOLUTION | RESPIRATORY_TRACT | Status: DC
  Administered 2013-09-22: 15 mg/h via RESPIRATORY_TRACT

## 2013-09-22 MED ORDER — ALBUTEROL SULFATE HFA 108 (90 BASE) MCG/ACT IN AERS
2.0000 | INHALATION_SPRAY | Freq: Once | RESPIRATORY_TRACT | Status: AC
  Administered 2013-09-22: 2 via RESPIRATORY_TRACT
  Filled 2013-09-22: qty 6.7

## 2013-09-22 MED ORDER — METHYLPREDNISOLONE SODIUM SUCC 125 MG IJ SOLR
125.0000 mg | Freq: Once | INTRAMUSCULAR | Status: AC
  Administered 2013-09-22: 125 mg via INTRAVENOUS
  Filled 2013-09-22: qty 2

## 2013-09-22 MED ORDER — ALBUTEROL SULFATE HFA 108 (90 BASE) MCG/ACT IN AERS: 2.0000 | INHALATION_SPRAY | Freq: Once | RESPIRATORY_TRACT | Status: DC

## 2013-09-22 NOTE — Discharge Instructions (Signed)
Chronic Obstructive Pulmonary Disease  Chronic obstructive pulmonary disease (COPD) is a common lung condition in which airflow from the lungs is limited. COPD is a general term that can be used to describe many different lung problems that limit airflow, including both chronic bronchitis and emphysema.  If you have COPD, your lung function will probably never return to normal, but there are measures you can take to improve lung function and make yourself feel better.   CAUSES   · Smoking (common).    · Exposure to secondhand smoke.    · Genetic problems.  · Chronic inflammatory lung diseases or recurrent infections.  SYMPTOMS   · Shortness of breath, especially with physical activity.    · Deep, persistent (chronic) cough with a large amount of thick mucus.    · Wheezing.    · Rapid breaths (tachypnea).    · Gray or bluish discoloration (cyanosis) of the skin, especially in fingers, toes, or lips.    · Fatigue.    · Weight loss.    · Frequent infections or episodes when breathing symptoms become much worse (exacerbations).    · Chest tightness.  DIAGNOSIS   Your healthcare provider will take a medical history and perform a physical examination to make the initial diagnosis.  Additional tests for COPD may include:   · Lung (pulmonary) function tests.  · Chest X-ray.  · CT scan.  · Blood tests.  TREATMENT   Treatment available to help you feel better when you have COPD include:   · Inhaler and nebulizer medicines. These help manage the symptoms of COPD and make your breathing more comfortable  · Supplemental oxygen. Supplemental oxygen is only helpful if you have a low oxygen level in your blood.    · Exercise and physical activity. These are beneficial for nearly all people with COPD. Some people may also benefit from a pulmonary rehabilitation program.  HOME CARE INSTRUCTIONS   · Take all medicines (inhaled or pills) as directed by your health care provider.  · Only take over-the-counter or prescription medicines  for pain, fever, or discomfort as directed by your health care provider.    · Avoid over-the-counter medicines or cough syrups that dry up your airway (such as antihistamines) and slow down the elimination of secretions unless instructed otherwise by your healthcare provider.    · If you are a smoker, the most important thing that you can do is stop smoking. Continuing to smoke will cause further lung damage and breathing trouble. Ask your health care provider for help with quitting smoking. He or she can direct you to community resources or hospitals that provide support.  · Avoid exposure to irritants such as smoke, chemicals, and fumes that aggravate your breathing.  · Use oxygen therapy and pulmonary rehabilitation if directed by your health care provider. If you require home oxygen therapy, ask your healthcare provider whether you should purchase a pulse oximeter to measure your oxygen level at home.    · Avoid contact with individuals who have a contagious illness.  · Avoid extreme temperature and humidity changes.  · Eat healthy foods. Eating smaller, more frequent meals and resting before meals may help you maintain your strength.  · Stay active, but balance activity with periods of rest. Exercise and physical activity will help you maintain your ability to do things you want to do.  · Preventing infection and hospitalization is very important when you have COPD. Make sure to receive all the vaccines your health care provider recommends, especially the pneumococcal and influenza vaccines. Ask your healthcare provider whether you   need a pneumonia vaccine.  · Learn and use relaxation techniques to manage stress.  · Learn and use controlled breathing techniques as directed by your health care provider. Controlled breathing techniques include:    · Pursed lip breathing. Start by breathing in (inhaling) through your nose for 1 second. Then, purse your lips as if you were going to whistle and breathe out (exhale)  through the pursed lips for 2 seconds.    · Diaphragmatic breathing. Start by putting one hand on your abdomen just above your waist. Inhale slowly through your nose. The hand on your abdomen should move out. Then purse your lips and exhale slowly. You should be able to feel the hand on your abdomen moving in as you exhale.    · Learn and use controlled coughing to clear mucus from your lungs. Controlled coughing is a series of short, progressive coughs. The steps of controlled coughing are:    1. Lean your head slightly forward.    2. Breathe in deeply using diaphragmatic breathing.    3. Try to hold your breath for 3 seconds.    4. Keep your mouth slightly open while coughing twice.    5. Spit any mucus out into a tissue.    6. Rest and repeat the steps once or twice as needed.  SEEK MEDICAL CARE IF:   · You are coughing up more mucus than usual.    · There is a change in the color or thickness of your mucus.    · Your breathing is more labored than usual.    · Your breathing is faster than usual.    SEEK IMMEDIATE MEDICAL CARE IF:   · You have shortness of breath while you are resting.    · You have shortness of breath that prevents you from:  · Being able to talk.    · Performing your usual physical activities.    · You have chest pain lasting longer than 5 minutes.    · Your skin color is more cyanotic than usual.  · You measure low oxygen saturations for longer than 5 minutes with a pulse oximeter.  MAKE SURE YOU:   · Understand these instructions.  · Will watch your condition.  · Will get help right away if you are not doing well or get worse.  Document Released: 03/29/2005 Document Revised: 04/09/2013 Document Reviewed: 02/13/2013  [employer]® Patient Information ©2014 [employer].

## 2013-09-22 NOTE — ED Provider Notes (Signed)
CSN: [id number]     Arrival date & time 09/22/13  1856 History   First MD Initiated Contact with Patient 09/22/13 2129     Chief Complaint  Patient presents with  . Shortness of Breath     HPI Pt states she is having shortness of breath that started a couple of days ago Pt states it has progressively gotten worse patient has long history of COPD and takes chronic albuterol but is out of both her inhaler and her nebulizer at home.  Patient can take IV steroids Solu-Medrol but cannot take by mouth steroids.  Patient is new the area.  Past Medical History  Diagnosis Date  . Asthma   . Cancer     cervical  . Anemia   . History of blood transfusion   . Heart valve disorder   . COPD (chronic obstructive pulmonary disease)   . Gastric ulcer     x 5   Past Surgical History  Procedure Laterality Date  . Appendectomy    . Cholecystectomy    . Tonsillectomy    . Cervical cone biopsy      x 6  . Cesarean section      x 2  . Shoulder surgery Left    Family History  Problem Relation Age of Onset  . Cancer Mother   . Hypertension Mother   . Diabetes Mother   . CAD Mother   . Diabetes Other   . Hypertension Other   . Cancer Other   . CAD Other    History  Substance Use Topics  . Smoking status: Current Every Day Smoker  . Smokeless tobacco: Not on file  . Alcohol Use: Yes     Comment: occasional   OB History   Grav Para Term Preterm Abortions TAB SAB Ect Mult Living                 Review of Systems  All other systems reviewed and are negative  Allergies  Acetaminophen; Doxycycline; Erythromycin; Motrin; Other; and Penicillins  Home Medications   Current Outpatient Rx  Name  Route  Sig  Dispense  Refill  . albuterol (PROVENTIL HFA;VENTOLIN HFA) 108 (90 BASE) MCG/ACT inhaler   Inhalation   Inhale 2 puffs into the lungs every 6 (six) hours as needed for wheezing.         Marland Kitchen albuterol (PROVENTIL) (2.5 MG/3ML) 0.083% nebulizer solution   Nebulization   Take 2.5  mg by nebulization every 6 (six) hours as needed for wheezing.         Marland Kitchen albuterol (PROVENTIL HFA;VENTOLIN HFA) 108 (90 BASE) MCG/ACT inhaler   Inhalation   Inhale 2 puffs into the lungs once.   1 Inhaler   1    BP 134/101  Pulse 97  Temp(Src) 98.5 F (36.9 C) (Oral)  Resp 24  Ht 5\' 6"  (1.676 m)  Wt 180 lb 6 oz (81.818 kg)  BMI 29.13 kg/m2  SpO2 100%  LMP 09/21/2013 Physical Exam  Nursing note and vitals reviewed. Constitutional: She is oriented to person, place, and time. She appears well-developed and well-nourished. No distress.  HENT:  Head: Normocephalic and atraumatic.  Eyes: Pupils are equal, round, and reactive to light.  Neck: Normal range of motion.  Cardiovascular: Normal rate and intact distal pulses.   Pulmonary/Chest: No accessory muscle usage. Not tachypneic. She is in respiratory distress (Mild). She has wheezes.  Abdominal: Normal appearance. She exhibits no distension.  Musculoskeletal: Normal range of motion.  Neurological: She is alert and oriented to person, place, and time. No cranial nerve deficit.  Skin: Skin is warm and dry. No rash noted.  Psychiatric: She has a normal mood and affect. Her behavior is normal.    ED Course  Procedures (including critical care time)  Medications  albuterol (PROVENTIL,VENTOLIN) solution continuous neb (15 mg/hr Nebulization New Bag/Given 09/22/13 2116)  albuterol (PROVENTIL HFA;VENTOLIN HFA) 108 (90 BASE) MCG/ACT inhaler 2 puff (not administered)  albuterol (PROVENTIL) (2.5 MG/3ML) 0.083% nebulizer solution 5 mg (5 mg Nebulization Given 09/22/13 2106)  diphenhydrAMINE (BENADRYL) injection 12.5 mg (12.5 mg Intravenous Given 09/22/13 2212)  methylPREDNISolone sodium succinate (SOLU-MEDROL) 125 mg/2 mL injection 125 mg (125 mg Intravenous Given 09/22/13 2209)  fentaNYL (SUBLIMAZE) injection 50 mcg (50 mcg Intravenous Given 09/22/13 2215)   After treatment in the ED the patient feels back to baseline and wants to go  home. Labs Review Labs Reviewed  CBC - Abnormal; Notable for the following:    Hemoglobin 9.3 (*)    HCT 31.1 (*)    MCV 64.0 (*)    MCH 19.1 (*)    MCHC 29.9 (*)    RDW 19.8 (*)    All other components within normal limits  BASIC METABOLIC PANEL - Abnormal; Notable for the following:    Potassium 3.5 (*)    All other components within normal limits  I-STAT TROPOININ, ED   Imaging Review Dg Chest 2 View  09/22/2013   CLINICAL DATA:  Shortness of breath and cough.  EXAM: CHEST  2 VIEW  COMPARISON:  04/13/2013  FINDINGS: The cardiomediastinal silhouette is within normal limits. Mild eventration of the right hemidiaphragm is unchanged. The lungs are well inflated and clear. No pleural effusion or pneumothorax is seen. No acute osseous abnormality is identified.  IMPRESSION: No active cardiopulmonary disease.   Electronically Signed   By: Logan Bores   On: 09/22/2013 21:29      MDM   Final diagnoses:  COPD (chronic obstructive pulmonary disease)  Wheezing        Dot Lanes, MD 09/22/13 2317

## 2013-09-22 NOTE — ED Notes (Signed)
Pt states she is having shortness of breath that started a couple of days ago  Pt states it has progressively gotten worse  Pt states she is allergic to clinical steroids and has been intubated many times for same

## 2013-09-22 NOTE — Progress Notes (Signed)
   CARE MANAGEMENT ED NOTE 09/22/2013  Patient:  REHAM, SLABAUGH   Account Number:  [id number]  Date Initiated:  09/22/2013  Documentation initiated by:  Livia Snellen  Subjective/Objective Assessment:   Patient presents to Ed with shortness of breath     Subjective/Objective Assessment Detail:   Patient has pmhx of COPD and asthma.  Has been intubated in the past.     Action/Plan:   Action/Plan Detail:   Anticipated DC Date:       Status Recommendation to Physician:   Result of Recommendation:    Other ED [place]  Other  PCP issues    Choice offered to / List presented to:            Status of service:  Completed, signed off  ED Comments:   ED Comments Detail:  Patient reports she doesnot have a pcp.  Patient has [employer] and has applied for Medicaid.  [facility] provided patient with a list of physicians who accept Medicare insurance within a five mile radius of patient's zip code.  Patient thankful for esources.  No further EDCM needs at this time.

## 2014-04-29 ENCOUNTER — Inpatient Hospital Stay (HOSPITAL_COMMUNITY)
Admission: AD | Admit: 2014-04-29 | Discharge: 2014-04-30 | Disposition: A | Discharge status: Home / Self Care | Payer: Medicare Other | Source: Ambulatory Visit | Attending: Obstetrics & Gynecology | Admitting: Obstetrics & Gynecology

## 2014-04-29 DIAGNOSIS — N939 Abnormal uterine and vaginal bleeding, unspecified: Secondary | ICD-10-CM | POA: Insufficient documentation

## 2014-04-29 DIAGNOSIS — D259 Leiomyoma of uterus, unspecified: Secondary | ICD-10-CM

## 2014-04-29 DIAGNOSIS — R102 Pelvic and perineal pain: Secondary | ICD-10-CM

## 2014-04-29 DIAGNOSIS — D509 Iron deficiency anemia, unspecified: Secondary | ICD-10-CM | POA: Insufficient documentation

## 2014-04-29 DIAGNOSIS — R52 Pain, unspecified: Secondary | ICD-10-CM | POA: Diagnosis not present

## 2014-04-29 NOTE — MAU Note (Signed)
Pt c/o lower abdominal pain that started around 10pm. States it felt like "gas bubbles" Tried to have a bowel movement but couldn't have one. States that pain starts in her upper stomach l;ike a "rubber band wrapping around her and then eases up." States she then started having some vaginal bleeding. Has a history of cervical cancer-diagnosed 4 years ago-noncompliant with medications. LMP: 04/20/2014 was only 3 days

## 2014-04-30 ENCOUNTER — Encounter (HOSPITAL_COMMUNITY): Payer: Self-pay

## 2014-04-30 ENCOUNTER — Inpatient Hospital Stay (HOSPITAL_COMMUNITY): Payer: Medicare Other

## 2014-04-30 DIAGNOSIS — N939 Abnormal uterine and vaginal bleeding, unspecified: Secondary | ICD-10-CM | POA: Diagnosis not present

## 2014-04-30 DIAGNOSIS — Z8541 Personal history of malignant neoplasm of cervix uteri: Secondary | ICD-10-CM | POA: Diagnosis not present

## 2014-04-30 DIAGNOSIS — R102 Pelvic and perineal pain: Secondary | ICD-10-CM | POA: Diagnosis not present

## 2014-04-30 DIAGNOSIS — D509 Iron deficiency anemia, unspecified: Secondary | ICD-10-CM | POA: Diagnosis not present

## 2014-04-30 DIAGNOSIS — D259 Leiomyoma of uterus, unspecified: Secondary | ICD-10-CM | POA: Diagnosis not present

## 2014-04-30 LAB — COMPREHENSIVE METABOLIC PANEL
ALK PHOS: 56 U/L (ref 39–117)
ALT: 16 U/L (ref 0–35)
AST: 21 U/L (ref 0–37)
Albumin: 3.9 g/dL (ref 3.5–5.2)
Anion gap: 13 (ref 5–15)
BILIRUBIN TOTAL: 0.3 mg/dL (ref 0.3–1.2)
BUN: 5 mg/dL — AB (ref 6–23)
CHLORIDE: 100 meq/L (ref 96–112)
CO2: 23 mEq/L (ref 19–32)
Calcium: 9.3 mg/dL (ref 8.4–10.5)
Creatinine, Ser: 0.7 mg/dL (ref 0.50–1.10)
GFR calc Af Amer: >90 mL/min (ref >90)
GFR calc non Af Amer: >90 mL/min (ref >90)
Glucose, Bld: 99 mg/dL (ref 70–99)
POTASSIUM: 3.4 meq/L — AB (ref 3.7–5.3)
Sodium: 136 mEq/L — ABNORMAL LOW (ref 137–147)
TOTAL PROTEIN: 7.5 g/dL (ref 6.0–8.3)

## 2014-04-30 LAB — URINALYSIS, ROUTINE W REFLEX MICROSCOPIC
Bilirubin Urine: NEGATIVE
GLUCOSE, UA: NEGATIVE mg/dL
Ketones, ur: NEGATIVE mg/dL
LEUKOCYTES UA: NEGATIVE
Nitrite: NEGATIVE
Protein, ur: NEGATIVE mg/dL
SPECIFIC GRAVITY, URINE: 1.015 (ref 1.005–1.030)
UROBILINOGEN UA: 0.2 mg/dL (ref 0.0–1.0)
pH: 7.5 (ref 5.0–8.0)

## 2014-04-30 LAB — HIV ANTIBODY (ROUTINE TESTING W REFLEX): HIV 1&2 Ab, 4th Generation: NONREACTIVE

## 2014-04-30 LAB — CBC
HEMATOCRIT: 28.3 % — AB (ref 36.0–46.0)
Hemoglobin: 8.1 g/dL — ABNORMAL LOW (ref 12.0–15.0)
MCH: 18 pg — ABNORMAL LOW (ref 26.0–34.0)
MCHC: 28.6 g/dL — ABNORMAL LOW (ref 30.0–36.0)
MCV: 62.7 fL — ABNORMAL LOW (ref 78.0–100.0)
Platelets: 251 10*3/uL (ref 150–400)
RBC: 4.51 MIL/uL (ref 3.87–5.11)
RDW: 19.8 % — ABNORMAL HIGH (ref 11.5–15.5)
WBC: 17.6 10*3/uL — AB (ref 4.0–10.5)

## 2014-04-30 LAB — POCT PREGNANCY, URINE: PREG TEST UR: NEGATIVE

## 2014-04-30 LAB — WET PREP, GENITAL
Trich, Wet Prep: NONE SEEN
Yeast Wet Prep HPF POC: NONE SEEN

## 2014-04-30 LAB — GC/CHLAMYDIA PROBE AMP
CT PROBE, AMP APTIMA: NEGATIVE
GC PROBE AMP APTIMA: NEGATIVE

## 2014-04-30 LAB — URINE MICROSCOPIC-ADD ON

## 2014-04-30 MED ORDER — HYDROMORPHONE HCL 1 MG/ML IJ SOLN
1.0000 mg | INTRAMUSCULAR | Status: AC
  Administered 2014-04-30: 1 mg via INTRAVENOUS
  Filled 2014-04-30: qty 1

## 2014-04-30 MED ORDER — OXYCODONE-ACETAMINOPHEN 5-325 MG PO TABS
2.0000 | ORAL_TABLET | ORAL | Status: AC
  Administered 2014-04-30: 2 via ORAL
  Filled 2014-04-30: qty 2

## 2014-04-30 MED ORDER — FERROUS SULFATE 325 (65 FE) MG PO TABS: 325.0000 mg | ORAL_TABLET | Freq: Two times a day (BID) | ORAL | Status: DC

## 2014-04-30 MED ORDER — OXYCODONE-ACETAMINOPHEN 5-325 MG PO TABS: 1.0000 | ORAL_TABLET | Freq: Four times a day (QID) | ORAL | Status: DC | PRN

## 2014-04-30 NOTE — MAU Provider Note (Signed)
Chief Complaint: Abdominal Pain   None    SUBJECTIVE HPI: Melissa Alvarez is a 38 y.o. G2P2000 who presents to maternity admissions via EMS reporting severe abdominal pain and vaginal bleeding.  She reports onset of sharp pain in mid abdomen "like a gas pain" and she tried to have a bowel movement but could not.  She showered, then the pain became more severe causing her to vomit.  She describes the pain as sharp and cramping pain in her lower abdomen mostly, but also radiating to her right lower abdomen and around to her upper abdomen as well.  She has hx of cholecystectomy and appendectomy.  She also has hx of cervical cancer with cone biopsy x 6.  She reports her periods are usually regular but last month she had bleeding three different times and today's bleeding is not at the expected time.  The bleeding is moderate, and she has worn 1 pad since bleeding started a couple of hours ago.  She does not have a regular Gyn provider since moving back to [place].  She denies vaginal itching/burning, urinary symptoms, h/a, dizziness, or fever/chills.     Past Medical History  Diagnosis Date  . Asthma   . Cancer     cervical  . Anemia   . History of blood transfusion   . Heart valve disorder   . COPD (chronic obstructive pulmonary disease)   . Gastric ulcer     x 5   Past Surgical History  Procedure Laterality Date  . Appendectomy    . Cholecystectomy    . Tonsillectomy    . Cervical cone biopsy      x 6  . Cesarean section      x 2  . Shoulder surgery Left    History   Social History  . Marital Status: Single    Spouse Name: N/A    Number of Children: N/A  . Years of Education: N/A   Occupational History  . Not on file.   Social History Main Topics  . Smoking status: Current Every Day Smoker  . Smokeless tobacco: Not on file  . Alcohol Use: Yes     Comment: occasional  . Drug Use: Yes    Special: Marijuana  . Sexual Activity: Not on file   Other Topics Concern  .  Not on file   Social History Narrative  . No narrative on file   No current facility-administered medications on file prior to encounter.   Current Outpatient Prescriptions on File Prior to Encounter  Medication Sig Dispense Refill  . albuterol (PROVENTIL HFA;VENTOLIN HFA) 108 (90 BASE) MCG/ACT inhaler Inhale 2 puffs into the lungs every 6 (six) hours as needed for wheezing.      Marland Kitchen albuterol (PROVENTIL HFA;VENTOLIN HFA) 108 (90 BASE) MCG/ACT inhaler Inhale 2 puffs into the lungs once.  1 Inhaler  1  . albuterol (PROVENTIL) (2.5 MG/3ML) 0.083% nebulizer solution Take 2.5 mg by nebulization every 6 (six) hours as needed for wheezing.       Allergies  Allergen Reactions  . Acetaminophen     With codeine   . Doxycycline   . Erythromycin   . Motrin [Ibuprofen]   . Other Hives    Clinical steroids except for solu medrol  . Penicillins     ROS: Pertinent items in HPI  OBJECTIVE Blood pressure 141/76, pulse 101, temperature 99.4 F (37.4 C), temperature source Oral, resp. rate 18, last menstrual period 04/20/2014, SpO2 100.00%. GENERAL: Well-developed, well-nourished  female in no acute distress.  HEENT: Normocephalic HEART: normal rate RESP: normal effort ABDOMEN: Soft, non-tender EXTREMITIES: Nontender, no edema NEURO: Alert and oriented Pelvic exam: Cervix pink, visually closed, without lesion, moderate amount dark red bleeding with some pink frothy discharge, vaginal walls and external genitalia normal Bimanual exam: Cervix 0/long/high, firm, anterior, neg CMT, uterus tender, nonenlarged, significant right adnexal tenderness with guarding, difficult to palpate for enlargement or mass related to pt guarding  LAB RESULTS Results for orders placed during the hospital encounter of 04/29/14 (from the past 24 hour(s))  URINALYSIS, ROUTINE W REFLEX MICROSCOPIC     Status: Abnormal   Collection Time    04/30/14 12:09 AM      Result Value Ref Range   Color, Urine YELLOW  YELLOW    APPearance CLEAR  CLEAR   Specific Gravity, Urine 1.015  1.005 - 1.030   pH 7.5  5.0 - 8.0   Glucose, UA NEGATIVE  NEGATIVE mg/dL   Hgb urine dipstick MODERATE (*) NEGATIVE   Bilirubin Urine NEGATIVE  NEGATIVE   Ketones, ur NEGATIVE  NEGATIVE mg/dL   Protein, ur NEGATIVE  NEGATIVE mg/dL   Urobilinogen, UA 0.2  0.0 - 1.0 mg/dL   Nitrite NEGATIVE  NEGATIVE   Leukocytes, UA NEGATIVE  NEGATIVE  URINE MICROSCOPIC-ADD ON     Status: Abnormal   Collection Time    04/30/14 12:09 AM      Result Value Ref Range   Squamous Epithelial / LPF RARE  RARE   WBC, UA 0-2  <3 WBC/hpf   RBC / HPF 7-10  <3 RBC/hpf   Bacteria, UA FEW (*) RARE  POCT PREGNANCY, URINE     Status: None   Collection Time    04/30/14 12:22 AM      Result Value Ref Range   Preg Test, Ur NEGATIVE  NEGATIVE  CBC     Status: Abnormal   Collection Time    04/30/14 12:37 AM      Result Value Ref Range   WBC 17.6 (*) 4.0 - 10.5 K/uL   RBC 4.51  3.87 - 5.11 MIL/uL   Hemoglobin 8.1 (*) 12.0 - 15.0 g/dL   HCT 28.3 (*) 36.0 - 46.0 %   MCV 62.7 (*) 78.0 - 100.0 fL   MCH 18.0 (*) 26.0 - 34.0 pg   MCHC 28.6 (*) 30.0 - 36.0 g/dL   RDW 19.8 (*) 11.5 - 15.5 %   Platelets 251  150 - 400 K/uL  COMPREHENSIVE METABOLIC PANEL     Status: Abnormal   Collection Time    04/30/14 12:37 AM      Result Value Ref Range   Sodium 136 (*) 137 - 147 mEq/L   Potassium 3.4 (*) 3.7 - 5.3 mEq/L   Chloride 100  96 - 112 mEq/L   CO2 23  19 - 32 mEq/L   Glucose, Bld 99  70 - 99 mg/dL   BUN 5 (*) 6 - 23 mg/dL   Creatinine, Ser 0.70  0.50 - 1.10 mg/dL   Calcium 9.3  8.4 - 10.5 mg/dL   Total Protein 7.5  6.0 - 8.3 g/dL   Albumin 3.9  3.5 - 5.2 g/dL   AST 21  0 - 37 U/L   ALT 16  0 - 35 U/L   Alkaline Phosphatase 56  39 - 117 U/L   Total Bilirubin 0.3  0.3 - 1.2 mg/dL   GFR calc non Af Amer >90  >90 mL/min  GFR calc Af Amer >90  >90 mL/min   Anion gap 13  5 - 15  WET PREP, GENITAL     Status: Abnormal   Collection Time    04/30/14 12:55 AM       Result Value Ref Range   Yeast Wet Prep HPF POC NONE SEEN  NONE SEEN   Trich, Wet Prep NONE SEEN  NONE SEEN   Clue Cells Wet Prep HPF POC FEW (*) NONE SEEN   WBC, Wet Prep HPF POC FEW (*) NONE SEEN    IMAGING No results found.  ASSESSMENT 1. Adnexal pain   2. Abnormal vaginal bleeding   3. Uterine leiomyoma, unspecified location   4. Anemia, iron deficiency     PLAN Dilaudid 1 mg IV prior to pelvic exam/ultrasound Consult Dr Elonda Husky. Reviewed labs/ultrasound findings Discharge home Percocet 5/325, take 1-2 Q 6 hours PRN x 15 tabs, first dose given in MAU prior to D/C Ferrous sulfate 325 mg PO BID Return to MAU or ED if symptoms persist or worsen F/U with routine Gyn care, pt given list of local providers    Medication List         albuterol (2.5 MG/3ML) 0.083% nebulizer solution  Commonly known as:  PROVENTIL  Take 2.5 mg by nebulization every 6 (six) hours as needed for wheezing.     albuterol 108 (90 BASE) MCG/ACT inhaler  Commonly known as:  PROVENTIL HFA;VENTOLIN HFA  Inhale 2 puffs into the lungs every 6 (six) hours as needed for wheezing.     albuterol 108 (90 BASE) MCG/ACT inhaler  Commonly known as:  PROVENTIL HFA;VENTOLIN HFA  Inhale 2 puffs into the lungs once.     oxyCODONE-acetaminophen 5-325 MG per tablet  Commonly known as:  PERCOCET/ROXICET  Take 1-2 tablets by mouth every 6 (six) hours as needed for severe pain.       Follow-up Information   Follow up with [facility]. (Or Emergency Room as needed for emergencies)    Contact information:   [address] [id number] [place] [address] [phone]      Please follow up. (With Gyn provider of your choice)       Fatima Blank Certified Nurse-Midwife 04/30/2014  2:51 AM

## 2014-04-30 NOTE — Discharge Instructions (Signed)
Uterine Fibroid A uterine fibroid is a growth (tumor) that occurs in your uterus. This type of tumor is not cancerous and does not spread out of the uterus. You can have one or many fibroids. Fibroids can vary in size, weight, and where they grow in the uterus. Some can become quite large. Most fibroids do not require medical treatment, but some can cause pain or heavy bleeding during and between periods. CAUSES  A fibroid is the result of a single uterine cell that keeps growing (unregulated), which is different than most cells in the human body. Most cells have a control mechanism that keeps them from reproducing without control.  SIGNS AND SYMPTOMS   Bleeding.  Pelvic pain and pressure.  Bladder problems due to the size of the fibroid.  Infertility and miscarriages depending on the size and location of the fibroid. DIAGNOSIS  Uterine fibroids are diagnosed through a physical exam. Your health care provider may feel the lumpy tumors during a pelvic exam. Ultrasonography may be done to get information regarding size, location, and number of tumors.  TREATMENT   Your health care provider may recommend watchful waiting. This involves getting the fibroid checked by your health care provider to see if it grows or shrinks.   Hormone treatment or an intrauterine device (IUD) may be prescribed.   Surgery may be needed to remove the fibroids (myomectomy) or the uterus (hysterectomy). This depends on your situation. When fibroids interfere with fertility and a woman wants to become pregnant, a health care provider may recommend having the fibroids removed.  [place] care depends on how you were treated. In general:   Keep all follow-up appointments with your health care provider.   Only take over-the-counter or prescription medicines as directed by your health care provider. If you were prescribed a hormone treatment, take the hormone medicines exactly as directed. Do not  take aspirin. It can cause bleeding.   Talk to your health care provider about taking iron pills.  If your periods are troublesome but not so heavy, lie down with your feet raised slightly above your heart. Place cold packs on your lower abdomen.   If your periods are heavy, write down the number of pads or tampons you use per month. Bring this information to your health care provider.   Include green vegetables in your diet.  SEEK IMMEDIATE MEDICAL CARE IF:  You have pelvic pain or cramps not controlled with medicines.   You have a sudden increase in pelvic pain.   You have an increase in bleeding between and during periods.   You have excessive periods and soak tampons or pads in a half hour or less.  You feel lightheaded or have fainting episodes. Document Released: 06/16/2000 Document Revised: 04/09/2013 Document Reviewed: 01/16/2013 [facility] Patient Information 2015 [place], [place]. This information is not intended to replace advice given to you by your health care provider. Make sure you discuss any questions you have with your health care provider.  Iron Deficiency Anemia Anemia is a condition in which there are less red blood cells or hemoglobin in the blood than normal. Hemoglobin is the part of red blood cells that carries oxygen. Iron deficiency anemia is anemia caused by too little iron. It is the most common type of anemia. It may leave you tired and short of breath. CAUSES   Lack of iron in the diet.  Poor absorption of iron, as seen with intestinal disorders.  Intestinal bleeding.  Heavy periods.  SIGNS AND SYMPTOMS  Mild anemia may not be noticeable. Symptoms may include:  Fatigue.  Headache.  Pale skin.  Weakness.  Tiredness.  Shortness of breath.  Dizziness.  Cold hands and feet.  Fast or irregular heartbeat. DIAGNOSIS  Diagnosis requires a thorough evaluation and physical exam by your health care provider. Blood tests are generally used  to confirm iron deficiency anemia. Additional tests may be done to find the underlying cause of your anemia. These may include:  Testing for blood in the stool (fecal occult blood test).  A procedure to see inside the colon and rectum (colonoscopy).  A procedure to see inside the esophagus and stomach (endoscopy). TREATMENT  Iron deficiency anemia is treated by correcting the cause of the deficiency. Treatment may involve:  Adding iron-rich foods to your diet.  Taking iron supplements. Pregnant or breastfeeding women need to take extra iron because their normal diet usually does not provide the required amount.  Taking vitamins. Vitamin C improves the absorption of iron. Your health care provider may recommend that you take your iron tablets with a glass of orange juice or vitamin C supplement.  Medicines to make heavy menstrual flow lighter.  Surgery. HOME CARE INSTRUCTIONS   Take iron as directed by your health care provider.  If you cannot tolerate taking iron supplements by mouth, talk to your health care provider about taking them through a vein (intravenously) or an injection into a muscle.  For the best iron absorption, iron supplements should be taken on an empty stomach. If you cannot tolerate them on an empty stomach, you may need to take them with food.  Do not drink milk or take antacids at the same time as your iron supplements. Milk and antacids may interfere with the absorption of iron.  Iron supplements can cause constipation. Make sure to include fiber in your diet to prevent constipation. A stool softener may also be recommended.  Take vitamins as directed by your health care provider.  Eat a diet rich in iron. Foods high in iron include liver, lean beef, whole-grain bread, eggs, dried fruit, and dark green leafy vegetables. SEEK IMMEDIATE MEDICAL CARE IF:   You faint. If this happens, do not drive. Call your local emergency services (911 in [place] if no other  help is available.  You have chest pain.  You feel nauseous or vomit.  You have severe or increased shortness of breath with activity.  You feel weak.  You have a rapid heartbeat.  You have unexplained sweating.  You become light-headed when getting up from a chair or bed. MAKE SURE YOU:   Understand these instructions.  Will watch your condition.  Will get help right away if you are not doing well or get worse. Document Released: 06/16/2000 Document Revised: 06/24/2013 Document Reviewed: 02/24/2013 [facility] Patient Information 2015 [place], [place]. This information is not intended to replace advice given to you by your health care provider. Make sure you discuss any questions you have with your health care provider.  OB/GYN Providers [facility]  [phone]     Phone: [place]                      [facility]  @[place]  [place]     Phone: [phone]  Phone: [phone]         Gershon Mussel  [place]     Phone: [phone]  Phone: [facility] [place]                hone: [phone]  Phone: [phone]         [facility] [address]      Phone: [phone]  Phone: [phone]         [place] [facility]                Phone: [phone]  [place])          Phone: [phone] [facility]   Phone: [phone]

## 2014-05-04 ENCOUNTER — Encounter (HOSPITAL_COMMUNITY): Payer: Self-pay

## 2014-06-24 ENCOUNTER — Emergency Department (HOSPITAL_COMMUNITY): Payer: Medicare Other

## 2014-06-24 ENCOUNTER — Emergency Department (HOSPITAL_COMMUNITY)
Admission: EM | Admit: 2014-06-24 | Discharge: 2014-06-25 | Disposition: A | Discharge status: Home / Self Care | Payer: Medicare Other | Attending: Emergency Medicine | Admitting: Emergency Medicine

## 2014-06-24 ENCOUNTER — Encounter (HOSPITAL_COMMUNITY): Payer: Self-pay | Admitting: *Deleted

## 2014-06-24 DIAGNOSIS — Z8719 Personal history of other diseases of the digestive system: Secondary | ICD-10-CM | POA: Insufficient documentation

## 2014-06-24 DIAGNOSIS — D500 Iron deficiency anemia secondary to blood loss (chronic): Secondary | ICD-10-CM | POA: Insufficient documentation

## 2014-06-24 DIAGNOSIS — R0602 Shortness of breath: Secondary | ICD-10-CM | POA: Diagnosis not present

## 2014-06-24 DIAGNOSIS — Z8541 Personal history of malignant neoplasm of cervix uteri: Secondary | ICD-10-CM | POA: Insufficient documentation

## 2014-06-24 DIAGNOSIS — Z79899 Other long term (current) drug therapy: Secondary | ICD-10-CM | POA: Insufficient documentation

## 2014-06-24 DIAGNOSIS — Z3202 Encounter for pregnancy test, result negative: Secondary | ICD-10-CM | POA: Diagnosis not present

## 2014-06-24 DIAGNOSIS — D259 Leiomyoma of uterus, unspecified: Secondary | ICD-10-CM | POA: Insufficient documentation

## 2014-06-24 DIAGNOSIS — J4541 Moderate persistent asthma with (acute) exacerbation: Secondary | ICD-10-CM | POA: Diagnosis not present

## 2014-06-24 DIAGNOSIS — Z720 Tobacco use: Secondary | ICD-10-CM | POA: Diagnosis not present

## 2014-06-24 DIAGNOSIS — Z880 Allergy status to penicillin: Secondary | ICD-10-CM | POA: Diagnosis not present

## 2014-06-24 DIAGNOSIS — R05 Cough: Secondary | ICD-10-CM | POA: Diagnosis not present

## 2014-06-24 DIAGNOSIS — Z9049 Acquired absence of other specified parts of digestive tract: Secondary | ICD-10-CM | POA: Insufficient documentation

## 2014-06-24 DIAGNOSIS — J441 Chronic obstructive pulmonary disease with (acute) exacerbation: Secondary | ICD-10-CM | POA: Insufficient documentation

## 2014-06-24 DIAGNOSIS — Z8679 Personal history of other diseases of the circulatory system: Secondary | ICD-10-CM | POA: Diagnosis not present

## 2014-06-24 DIAGNOSIS — D6489 Other specified anemias: Secondary | ICD-10-CM | POA: Diagnosis not present

## 2014-06-24 DIAGNOSIS — R1084 Generalized abdominal pain: Secondary | ICD-10-CM

## 2014-06-24 LAB — COMPREHENSIVE METABOLIC PANEL
ALT: 14 U/L (ref 0–35)
AST: 22 U/L (ref 0–37)
Albumin: 3.9 g/dL (ref 3.5–5.2)
Alkaline Phosphatase: 51 U/L (ref 39–117)
Anion gap: 8 (ref 5–15)
BUN: <5 mg/dL — ABNORMAL LOW (ref 6–23)
CO2: 25 mmol/L (ref 19–32)
Calcium: 9.7 mg/dL (ref 8.4–10.5)
Chloride: 105 mEq/L (ref 96–112)
Creatinine, Ser: 0.74 mg/dL (ref 0.50–1.10)
GFR calc Af Amer: >90 mL/min (ref >90)
GFR calc non Af Amer: >90 mL/min (ref >90)
Glucose, Bld: 94 mg/dL (ref 70–99)
Potassium: 3.3 mmol/L — ABNORMAL LOW (ref 3.5–5.1)
Sodium: 138 mmol/L (ref 135–145)
Total Bilirubin: 0.3 mg/dL (ref 0.3–1.2)
Total Protein: 7 g/dL (ref 6.0–8.3)

## 2014-06-24 LAB — CBC WITH DIFFERENTIAL/PLATELET
Basophils Absolute: 0 10*3/uL (ref 0.0–0.1)
Basophils Relative: 0 % (ref 0–1)
Eosinophils Absolute: 0.2 10*3/uL (ref 0.0–0.7)
Eosinophils Relative: 2 % (ref 0–5)
HCT: 28.6 % — ABNORMAL LOW (ref 36.0–46.0)
Hemoglobin: 8 g/dL — ABNORMAL LOW (ref 12.0–15.0)
Lymphocytes Relative: 39 % (ref 12–46)
Lymphs Abs: 3.4 10*3/uL (ref 0.7–4.0)
MCH: 17.4 pg — ABNORMAL LOW (ref 26.0–34.0)
MCHC: 28 g/dL — ABNORMAL LOW (ref 30.0–36.0)
MCV: 62.2 fL — ABNORMAL LOW (ref 78.0–100.0)
Monocytes Absolute: 0.7 10*3/uL (ref 0.1–1.0)
Monocytes Relative: 8 % (ref 3–12)
Neutro Abs: 4.4 10*3/uL (ref 1.7–7.7)
Neutrophils Relative %: 51 % (ref 43–77)
Platelets: 242 10*3/uL (ref 150–400)
RBC: 4.6 MIL/uL (ref 3.87–5.11)
RDW: 20 % — ABNORMAL HIGH (ref 11.5–15.5)
WBC: 8.7 10*3/uL (ref 4.0–10.5)

## 2014-06-24 LAB — LIPASE, BLOOD: Lipase: 38 U/L (ref 11–59)

## 2014-06-24 MED ORDER — SODIUM CHLORIDE 0.9 % IV BOLUS (SEPSIS)
1000.0000 mL | Freq: Once | INTRAVENOUS | Status: AC
  Administered 2014-06-24: 1000 mL via INTRAVENOUS

## 2014-06-24 MED ORDER — ALBUTEROL (5 MG/ML) CONTINUOUS INHALATION SOLN
15.0000 mg/h | INHALATION_SOLUTION | RESPIRATORY_TRACT | Status: AC
  Administered 2014-06-24: 15 mg/h via RESPIRATORY_TRACT
  Filled 2014-06-24: qty 20

## 2014-06-24 MED ORDER — IPRATROPIUM BROMIDE 0.02 % IN SOLN
0.5000 mg | Freq: Once | RESPIRATORY_TRACT | Status: AC
  Administered 2014-06-24: 0.5 mg via RESPIRATORY_TRACT
  Filled 2014-06-24: qty 2.5

## 2014-06-24 MED ORDER — METHYLPREDNISOLONE SODIUM SUCC 125 MG IJ SOLR
125.0000 mg | Freq: Once | INTRAMUSCULAR | Status: AC
  Administered 2014-06-24: 125 mg via INTRAVENOUS
  Filled 2014-06-24: qty 2

## 2014-06-24 MED ORDER — MORPHINE SULFATE 4 MG/ML IJ SOLN
6.0000 mg | Freq: Once | INTRAMUSCULAR | Status: AC
  Administered 2014-06-24: 6 mg via INTRAVENOUS
  Filled 2014-06-24: qty 2

## 2014-06-24 MED ORDER — ONDANSETRON HCL 4 MG/2ML IJ SOLN
4.0000 mg | Freq: Once | INTRAMUSCULAR | Status: AC
  Administered 2014-06-24: 4 mg via INTRAVENOUS
  Filled 2014-06-24: qty 2

## 2014-06-24 MED ORDER — IPRATROPIUM-ALBUTEROL 0.5-2.5 (3) MG/3ML IN SOLN
3.0000 mL | Freq: Once | RESPIRATORY_TRACT | Status: AC
  Administered 2014-06-24: 3 mL via RESPIRATORY_TRACT
  Filled 2014-06-24: qty 3

## 2014-06-24 NOTE — ED Provider Notes (Signed)
CSN: [id number]     Arrival date & time 06/24/14  2201 History   First MD Initiated Contact with Patient 06/24/14 2217     Chief Complaint  Patient presents with  . Asthma  . Cough  . Abdominal Pain     (Consider location/radiation/quality/duration/timing/severity/associated sxs/prior Treatment) HPI Patient presents to the emergency department with wheezing and shortness of breath over the last 2 weeks.  She is also complaining of generalized abdominal pain over the last month.  Patient states that she was diagnosed at [facility] with fibroids of her uterus.  Patient states the pain continues.  Patient states that she also has had increasing shortness of breath over the last few weeks.  She is a smoker of August 2 packs a day with COPD and asthma.  The patient denies chest pain, nausea, vomiting, diarrhea, weakness, dizziness, headache, blurred vision, back pain, neck pain, fever, runny nose, sore throat, lightheadedness, polyuria, polydipsia, hematemesis, or syncope.  The patient states that nothing seems to make her condition better.  Palpation of her abdomen makes the condition worsens.  Activity makes her wheezing and shortness of breath, worse Past Medical History  Diagnosis Date  . Asthma   . Cancer     cervical  . Anemia   . History of blood transfusion   . Heart valve disorder   . COPD (chronic obstructive pulmonary disease)   . Gastric ulcer     x 5   Past Surgical History  Procedure Laterality Date  . Appendectomy    . Cholecystectomy    . Tonsillectomy    . Cervical cone biopsy      x 6  . Cesarean section      x 2  . Shoulder surgery Left    Family History  Problem Relation Age of Onset  . Cancer Mother   . Hypertension Mother   . Diabetes Mother   . CAD Mother   . Diabetes Other   . Hypertension Other   . Cancer Other   . CAD Other    History  Substance Use Topics  . Smoking status: Current Every Day Smoker  . Smokeless tobacco: Not on file  .  Alcohol Use: Yes     Comment: occasional   OB History    Gravida Para Term Preterm AB TAB SAB Ectopic Multiple Living   2 2 2             Review of Systems All other systems negative except as documented in the HPI. All pertinent positives and negatives as reviewed in the HPI.   Allergies  Acetaminophen; Other; Penicillins; Doxycycline; Erythromycin; and Motrin  Home Medications   Prior to Admission medications   Medication Sig Start Date End Date Taking? Authorizing Provider  albuterol (PROVENTIL HFA;VENTOLIN HFA) 108 (90 BASE) MCG/ACT inhaler Inhale 2 puffs into the lungs once. 09/22/13   Dot Lanes, MD  albuterol (PROVENTIL) (2.5 MG/3ML) 0.083% nebulizer solution Take 2.5 mg by nebulization every 6 (six) hours as needed for wheezing.    Historical Provider, MD  ferrous sulfate (FERROUSUL) 325 (65 FE) MG tablet Take 1 tablet (325 mg total) by mouth 2 (two) times daily. 04/30/14   Kathie Dike Leftwich-Kirby, CNM  oxyCODONE-acetaminophen (PERCOCET/ROXICET) 5-325 MG per tablet Take 1-2 tablets by mouth every 6 (six) hours as needed for severe pain. 04/30/14   Lattie Haw A Leftwich-Kirby, CNM   BP 137/73 mmHg  Pulse 88  Temp(Src) 99.1 F (37.3 C)  Resp 15  SpO2 100%  Physical Exam  Constitutional: She is oriented to person, place, and time. She appears well-developed and well-nourished. No distress.  HENT:  Head: Normocephalic and atraumatic.  Mouth/Throat: Oropharynx is clear and moist.  Eyes: Pupils are equal, round, and reactive to light.  Neck: Normal range of motion. Neck supple.  Cardiovascular: Normal rate, regular rhythm and normal heart sounds.  Exam reveals no gallop and no friction rub.   No murmur heard. Pulmonary/Chest: Tachypnea noted. She has no decreased breath sounds. She has wheezes. She has no rhonchi. She has no rales.  Abdominal: Soft. Normal appearance and bowel sounds are normal. She exhibits no distension. There is generalized tenderness. There is no rigidity, no  rebound, no guarding and no CVA tenderness. No hernia.  Neurological: She is alert and oriented to person, place, and time. She exhibits normal muscle tone. Coordination normal.  Skin: Skin is warm and dry. No rash noted. No erythema.  Psychiatric: She has a normal mood and affect. Her behavior is normal.  Nursing note and vitals reviewed.   ED Course  Procedures (including critical care time) Labs Review Labs Reviewed  COMPREHENSIVE METABOLIC PANEL - Abnormal; Notable for the following:    Potassium 3.3 (*)    BUN <5 (*)    All other components within normal limits  CBC WITH DIFFERENTIAL - Abnormal; Notable for the following:    Hemoglobin 8.0 (*)    HCT 28.6 (*)    MCV 62.2 (*)    MCH 17.4 (*)    MCHC 28.0 (*)    RDW 20.0 (*)    All other components within normal limits  LIPASE, BLOOD  URINALYSIS, ROUTINE W REFLEX MICROSCOPIC  PREGNANCY, URINE    Imaging Review Dg Chest 2 View  06/25/2014   CLINICAL DATA:  Cough, shortness of breath, abdominal pain  EXAM: CHEST  2 VIEW  COMPARISON:  09/22/2013  FINDINGS: The heart size and mediastinal contours are within normal limits. Lungs are hypoaerated with crowding of the bronchovascular markings. No focal pulmonary opacity. Minimal central bronchial wall thickening may be seen with reactive airways disease or bronchitis. The visualized skeletal structures are unremarkable.  IMPRESSION: Mild central bronchial wall thickening which may be seen with reactive airways disease or bronchitis.   Electronically Signed   By: Conchita Paris M.D.   On: 06/25/2014 00:16   Patient will be followed along by Dr. Sharol Given.  She is awaiting CT scan results and will need reevaluation once these results are complete. The patient may require admission to the hospital.   MDM   Final diagnoses:  COPD exacerbation  Iron deficiency anemia due to chronic blood loss  Uterine leiomyoma, unspecified location  Generalized abdominal pain  Asthma, moderate  persistent, with acute exacerbation  Tobacco abuse       Brent General, PA-C 06/29/14 6644  Kalman Drape, MD 07/02/14 [phone]

## 2014-06-24 NOTE — ED Notes (Signed)
Pt requesting pain medication. [place], [place] notified.

## 2014-06-24 NOTE — ED Notes (Signed)
Pt in c/o increased shortness of breath, states she has a history of asthma and this feels the same, her machine for breathing treatments is broken, pt has been intubated multiple times for her asthma, also c/o generalized abd pain, [facility]

## 2014-06-25 ENCOUNTER — Emergency Department (HOSPITAL_COMMUNITY): Payer: Medicare Other

## 2014-06-25 ENCOUNTER — Encounter (HOSPITAL_COMMUNITY): Payer: Self-pay | Admitting: Radiology

## 2014-06-25 DIAGNOSIS — J441 Chronic obstructive pulmonary disease with (acute) exacerbation: Secondary | ICD-10-CM | POA: Diagnosis not present

## 2014-06-25 LAB — URINALYSIS, ROUTINE W REFLEX MICROSCOPIC
Bilirubin Urine: NEGATIVE
Glucose, UA: NEGATIVE mg/dL
Hgb urine dipstick: NEGATIVE
Ketones, ur: NEGATIVE mg/dL
Nitrite: NEGATIVE
Protein, ur: NEGATIVE mg/dL
Specific Gravity, Urine: 1.019 (ref 1.005–1.030)
Urobilinogen, UA: 1 mg/dL (ref 0.0–1.0)
pH: 7 (ref 5.0–8.0)

## 2014-06-25 LAB — URINE MICROSCOPIC-ADD ON

## 2014-06-25 LAB — PREGNANCY, URINE: Preg Test, Ur: NEGATIVE

## 2014-06-25 MED ORDER — IOHEXOL 300 MG/ML  SOLN
100.0000 mL | Freq: Once | INTRAMUSCULAR | Status: AC | PRN
  Administered 2014-06-25: 100 mL via INTRAVENOUS

## 2014-06-25 MED ORDER — ALBUTEROL SULFATE HFA 108 (90 BASE) MCG/ACT IN AERS: 2.0000 | INHALATION_SPRAY | Freq: Once | RESPIRATORY_TRACT | Status: DC

## 2014-06-25 MED ORDER — ONDANSETRON HCL 4 MG/2ML IJ SOLN
INTRAMUSCULAR | Status: AC
  Filled 2014-06-25: qty 2

## 2014-06-25 MED ORDER — ONDANSETRON 8 MG PO TBDP: 8.0000 mg | ORAL_TABLET | Freq: Three times a day (TID) | ORAL | Status: DC | PRN

## 2014-06-25 MED ORDER — BENZONATATE 100 MG PO CAPS: 100.0000 mg | ORAL_CAPSULE | Freq: Three times a day (TID) | ORAL | Status: DC

## 2014-06-25 MED ORDER — PREDNISONE 20 MG PO TABS: 60.0000 mg | ORAL_TABLET | Freq: Every day | ORAL | Status: DC

## 2014-06-25 MED ORDER — ONDANSETRON HCL 4 MG/2ML IJ SOLN
4.0000 mg | Freq: Once | INTRAMUSCULAR | Status: AC
  Administered 2014-06-25: 4 mg via INTRAVENOUS

## 2014-06-25 MED ORDER — HYDROMORPHONE HCL 1 MG/ML IJ SOLN
1.0000 mg | Freq: Once | INTRAMUSCULAR | Status: AC
  Administered 2014-06-25: 1 mg via INTRAVENOUS

## 2014-06-25 MED ORDER — OXYCODONE-ACETAMINOPHEN 5-325 MG PO TABS: 2.0000 | ORAL_TABLET | ORAL | Status: DC | PRN

## 2014-06-25 MED ORDER — METHYLPREDNISOLONE SODIUM SUCC 125 MG IJ SOLR
125.0000 mg | Freq: Once | INTRAMUSCULAR | Status: AC
  Administered 2014-06-25: 125 mg via INTRAVENOUS
  Filled 2014-06-25: qty 2

## 2014-06-25 MED ORDER — GUAIFENESIN 100 MG/5ML PO LIQD: 100.0000 mg | ORAL | Status: DC | PRN

## 2014-06-25 MED ORDER — ALBUTEROL SULFATE (2.5 MG/3ML) 0.083% IN NEBU: 2.5000 mg | INHALATION_SOLUTION | Freq: Four times a day (QID) | RESPIRATORY_TRACT | Status: DC | PRN

## 2014-06-25 MED ORDER — ALBUTEROL SULFATE HFA 108 (90 BASE) MCG/ACT IN AERS
1.0000 | INHALATION_SPRAY | RESPIRATORY_TRACT | Status: DC | PRN
  Administered 2014-06-25: 1 via RESPIRATORY_TRACT
  Filled 2014-06-25: qty 6.7

## 2014-06-25 MED ORDER — HYDROMORPHONE HCL 1 MG/ML IJ SOLN
INTRAMUSCULAR | Status: AC
  Filled 2014-06-25: qty 1

## 2014-06-25 NOTE — Discharge Instructions (Signed)
Take medications as prescribed.  [facility] and with your primary care doctor.  STOP SMOKING!!!   Anemia, Nonspecific Anemia is a condition in which the concentration of red blood cells or hemoglobin in the blood is below normal. Hemoglobin is a substance in red blood cells that carries oxygen to the tissues of the body. Anemia results in not enough oxygen reaching these tissues.  CAUSES  Common causes of anemia include:   Excessive bleeding. Bleeding may be internal or external. This includes excessive bleeding from periods (in women) or from the intestine.   Poor nutrition.   Chronic kidney, thyroid, and liver disease.  Bone marrow disorders that decrease red blood cell production.  Cancer and treatments for cancer.  HIV, AIDS, and their treatments.  Spleen problems that increase red blood cell destruction.  Blood disorders.  Excess destruction of red blood cells due to infection, medicines, and autoimmune disorders. SIGNS AND SYMPTOMS   Minor weakness.   Dizziness.   Headache.  Palpitations.   Shortness of breath, especially with exercise.   Paleness.  Cold sensitivity.  Indigestion.  Nausea.  Difficulty sleeping.  Difficulty concentrating. Symptoms may occur suddenly or they may develop slowly.  DIAGNOSIS  Additional blood tests are often needed. These help your health care provider determine the best treatment. Your health care provider will check your stool for blood and look for other causes of blood loss.  TREATMENT  Treatment varies depending on the cause of the anemia. Treatment can include:   Supplements of iron, vitamin B76, or folic acid.   Hormone medicines.   A blood transfusion. This may be needed if blood loss is severe.   Hospitalization. This may be needed if there is significant continual blood loss.   Dietary changes.  Spleen removal. HOME CARE INSTRUCTIONS Keep all follow-up appointments. It often  takes many weeks to correct anemia, and having your health care provider check on your condition and your response to treatment is very important. SEEK IMMEDIATE MEDICAL CARE IF:   You develop extreme weakness, shortness of breath, or chest pain.   You become dizzy or have trouble concentrating.  You develop heavy vaginal bleeding.   You develop a rash.   You have bloody or black, tarry stools.   You faint.   You vomit up blood.   You vomit repeatedly.   You have abdominal pain.  You have a fever or persistent symptoms for more than 2-3 days.   You have a fever and your symptoms suddenly get worse.   You are dehydrated.  MAKE SURE YOU:  Understand these instructions.  Will watch your condition.  Will get help right away if you are not doing well or get worse. Document Released: 07/27/2004 Document Revised: 02/19/2013 Document Reviewed: 12/13/2012 [facility] Patient Information 2015 [place], [place]. This information is not intended to replace advice given to you by your health care provider. Make sure you discuss any questions you have with your health care provider.  Bronchospasm A bronchospasm is a spasm or tightening of the airways going into the lungs. During a bronchospasm breathing becomes more difficult because the airways get smaller. When this happens there can be coughing, a whistling sound when breathing (wheezing), and difficulty breathing. Bronchospasm is often associated with asthma, but not all patients who experience a bronchospasm have asthma. CAUSES  A bronchospasm is caused by inflammation or irritation of the airways. The inflammation or irritation may be triggered by:   Allergies (such as to animals,  pollen, food, or mold). Allergens that cause bronchospasm may cause wheezing immediately after exposure or many hours later.   Infection. Viral infections are believed to be the most common cause of bronchospasm.   Exercise.   Irritants (such  as pollution, cigarette smoke, strong odors, aerosol sprays, and paint fumes).   Weather changes. Winds increase molds and pollens in the air. Rain refreshes the air by washing irritants out. Cold air may cause inflammation.   Stress and emotional upset.  SIGNS AND SYMPTOMS   Wheezing.   Excessive nighttime coughing.   Frequent or severe coughing with a simple cold.   Chest tightness.   Shortness of breath.  DIAGNOSIS  Bronchospasm is usually diagnosed through a history and physical exam. Tests, such as chest X-rays, are sometimes done to look for other conditions. TREATMENT   Inhaled medicines can be given to open up your airways and help you breathe. The medicines can be given using either an inhaler or a nebulizer machine.  Corticosteroid medicines may be given for severe bronchospasm, usually when it is associated with asthma. HOME CARE INSTRUCTIONS   Always have a plan prepared for seeking medical care. Know when to call your health care provider and local emergency services (911 in the [place] Know where you can access local emergency care.  Only take medicines as directed by your health care provider.  If you were prescribed an inhaler or nebulizer machine, ask your health care provider to explain how to use it correctly. Always use a spacer with your inhaler if you were given one.  It is necessary to remain calm during an attack. Try to relax and breathe more slowly.  Control your home environment in the following ways:   Change your heating and air conditioning filter at least once a month.   Limit your use of fireplaces and wood stoves.  Do not smoke and do not allow smoking in your home.   Avoid exposure to perfumes and fragrances.   Get rid of pests (such as roaches and mice) and their droppings.   Throw away plants if you see mold on them.   Keep your house clean and dust free.   Replace carpet with wood, tile, or vinyl flooring. Carpet can  trap dander and dust.   Use allergy-proof pillows, mattress covers, and box spring covers.   Wash bed sheets and blankets every week in hot water and dry them in a dryer.   Use blankets that are made of polyester or cotton.   Wash hands frequently. SEEK MEDICAL CARE IF:   You have muscle aches.   You have chest pain.   The sputum changes from clear or white to yellow, green, gray, or bloody.   The sputum you cough up gets thicker.   There are problems that may be related to the medicine you are given, such as a rash, itching, swelling, or trouble breathing.  SEEK IMMEDIATE MEDICAL CARE IF:   You have worsening wheezing and coughing even after taking your prescribed medicines.   You have increased difficulty breathing.   You develop severe chest pain. MAKE SURE YOU:   Understand these instructions.  Will watch your condition.  Will get help right away if you are not doing well or get worse. Document Released: 06/22/2003 Document Revised: 06/24/2013 Document Reviewed: 12/09/2012 [facility] Patient Information 2015 [place], [place]. This information is not intended to replace advice given to you by your health care provider. Make sure you discuss any questions you have  with your health care provider.  Fibroids Fibroids are lumps (tumors) that can occur any place in a woman's body. These lumps are not cancerous. Fibroids vary in size, weight, and where they grow. HOME CARE  Do not take aspirin.  Write down the number of pads or tampons you use during your period. Tell your doctor. This can help determine the best treatment for you. GET HELP RIGHT AWAY IF:  You have pain in your lower belly (abdomen) that is not helped with medicine.  You have cramps that are not helped with medicine.  You have more bleeding between or during your period.  You feel lightheaded or pass out (faint).  Your lower belly pain gets worse. MAKE SURE YOU:  Understand these  instructions.  Will watch your condition.  Will get help right away if you are not doing well or get worse. Document Released: 07/22/2010 Document Revised: 09/11/2011 Document Reviewed: 07/22/2010 [facility] Patient Information 2015 [place], [place]. This information is not intended to replace advice given to you by your health care provider. Make sure you discuss any questions you have with your health care provider.  Smoking Cessation, Tips for Success If you are ready to quit smoking, congratulations! You have chosen to help yourself be healthier. Cigarettes bring nicotine, tar, carbon monoxide, and other irritants into your body. Your lungs, heart, and blood vessels will be able to work better without these poisons. There are many different ways to quit smoking. Nicotine gum, nicotine patches, a nicotine inhaler, or nicotine nasal spray can help with physical craving. Hypnosis, support groups, and medicines help break the habit of smoking. WHAT THINGS CAN I DO TO MAKE QUITTING EASIER?  Here are some tips to help you quit for good:  Pick a date when you will quit smoking completely. Tell all of your friends and family about your plan to quit on that date.  Do not try to slowly cut down on the number of cigarettes you are smoking. Pick a quit date and quit smoking completely starting on that day.  Throw away all cigarettes.   Clean and remove all ashtrays from your home, work, and car.  On a card, write down your reasons for quitting. Carry the card with you and read it when you get the urge to smoke.  Cleanse your body of nicotine. Drink enough water and fluids to keep your urine clear or pale yellow. Do this after quitting to flush the nicotine from your body.  Learn to predict your moods. Do not let a bad situation be your excuse to have a cigarette. Some situations in your life might tempt you into wanting a cigarette.  Never have "just one" cigarette. It leads to wanting another and  another. Remind yourself of your decision to quit.  Change habits associated with smoking. If you smoked while driving or when feeling stressed, try other activities to replace smoking. Stand up when drinking your coffee. Brush your teeth after eating. Sit in a different chair when you read the paper. Avoid alcohol while trying to quit, and try to drink fewer caffeinated beverages. Alcohol and caffeine may urge you to smoke.  Avoid foods and drinks that can trigger a desire to smoke, such as sugary or spicy foods and alcohol.  Ask people who smoke not to smoke around you.  Have something planned to do right after eating or having a cup of coffee. For example, plan to take a walk or exercise.  Try a relaxation exercise to calm  you down and decrease your stress. Remember, you may be tense and nervous for the first 2 weeks after you quit, but this will pass.  Find new activities to keep your hands busy. Play with a pen, coin, or rubber band. Doodle or draw things on paper.  Brush your teeth right after eating. This will help cut down on the craving for the taste of tobacco after meals. You can also try mouthwash.   Use oral substitutes in place of cigarettes. Try using lemon drops, carrots, cinnamon sticks, or chewing gum. Keep them handy so they are available when you have the urge to smoke.  When you have the urge to smoke, try deep breathing.  Designate your home as a nonsmoking area.  If you are a heavy smoker, ask your health care provider about a prescription for nicotine chewing gum. It can ease your withdrawal from nicotine.  Reward yourself. Set aside the cigarette money you save and buy yourself something nice.  Look for support from others. Join a support group or smoking cessation program. Ask someone at home or at work to help you with your plan to quit smoking.  Always ask yourself, "Do I need this cigarette or is this just a reflex?" Tell yourself, "Today, I choose not to  smoke," or "I do not want to smoke." You are reminding yourself of your decision to quit.  Do not replace cigarette smoking with electronic cigarettes (commonly called e-cigarettes). The safety of e-cigarettes is unknown, and some may contain harmful chemicals.  If you relapse, do not give up! Plan ahead and think about what you will do the next time you get the urge to smoke. HOW WILL I FEEL WHEN I QUIT SMOKING? You may have symptoms of withdrawal because your body is used to nicotine (the addictive substance in cigarettes). You may crave cigarettes, be irritable, feel very hungry, cough often, get headaches, or have difficulty concentrating. The withdrawal symptoms are only temporary. They are strongest when you first quit but will go away within 10-14 days. When withdrawal symptoms occur, stay in control. Think about your reasons for quitting. Remind yourself that these are signs that your body is healing and getting used to being without cigarettes. Remember that withdrawal symptoms are easier to treat than the major diseases that smoking can cause.  Even after the withdrawal is over, expect periodic urges to smoke. However, these cravings are generally short lived and will go away whether you smoke or not. Do not smoke! WHAT RESOURCES ARE AVAILABLE TO HELP ME QUIT SMOKING? Your health care provider can direct you to community resources or hospitals for support, which may include:  Group support.  Education.  Hypnosis.  Therapy. Document Released: 03/17/2004 Document Revised: 11/03/2013 Document Reviewed: 12/05/2012 [facility] Patient Information 2015 [place], [place]. This information is not intended to replace advice given to you by your health care provider. Make sure you discuss any questions you have with your health care provider.

## 2014-07-10 ENCOUNTER — Encounter (HOSPITAL_COMMUNITY): Payer: Self-pay | Admitting: *Deleted

## 2014-07-10 ENCOUNTER — Emergency Department (HOSPITAL_COMMUNITY)
Admission: EM | Admit: 2014-07-10 | Discharge: 2014-07-10 | Disposition: A | Discharge status: Home / Self Care | Payer: Medicare Other | Attending: Emergency Medicine | Admitting: Emergency Medicine

## 2014-07-10 ENCOUNTER — Emergency Department (HOSPITAL_COMMUNITY): Payer: Medicare Other

## 2014-07-10 DIAGNOSIS — Z79899 Other long term (current) drug therapy: Secondary | ICD-10-CM | POA: Diagnosis not present

## 2014-07-10 DIAGNOSIS — Z880 Allergy status to penicillin: Secondary | ICD-10-CM | POA: Insufficient documentation

## 2014-07-10 DIAGNOSIS — S63654A Sprain of metacarpophalangeal joint of right ring finger, initial encounter: Secondary | ICD-10-CM | POA: Insufficient documentation

## 2014-07-10 DIAGNOSIS — R062 Wheezing: Secondary | ICD-10-CM | POA: Diagnosis not present

## 2014-07-10 DIAGNOSIS — Y929 Unspecified place or not applicable: Secondary | ICD-10-CM | POA: Diagnosis not present

## 2014-07-10 DIAGNOSIS — M7989 Other specified soft tissue disorders: Secondary | ICD-10-CM | POA: Diagnosis not present

## 2014-07-10 DIAGNOSIS — Y9389 Activity, other specified: Secondary | ICD-10-CM | POA: Diagnosis not present

## 2014-07-10 DIAGNOSIS — R0602 Shortness of breath: Secondary | ICD-10-CM | POA: Diagnosis not present

## 2014-07-10 DIAGNOSIS — Z8541 Personal history of malignant neoplasm of cervix uteri: Secondary | ICD-10-CM | POA: Diagnosis not present

## 2014-07-10 DIAGNOSIS — S6991XA Unspecified injury of right wrist, hand and finger(s), initial encounter: Secondary | ICD-10-CM | POA: Diagnosis present

## 2014-07-10 DIAGNOSIS — J449 Chronic obstructive pulmonary disease, unspecified: Secondary | ICD-10-CM | POA: Diagnosis not present

## 2014-07-10 DIAGNOSIS — Y998 Other external cause status: Secondary | ICD-10-CM | POA: Insufficient documentation

## 2014-07-10 DIAGNOSIS — D649 Anemia, unspecified: Secondary | ICD-10-CM | POA: Diagnosis not present

## 2014-07-10 DIAGNOSIS — Z8719 Personal history of other diseases of the digestive system: Secondary | ICD-10-CM | POA: Insufficient documentation

## 2014-07-10 DIAGNOSIS — S63614A Unspecified sprain of right ring finger, initial encounter: Secondary | ICD-10-CM | POA: Diagnosis not present

## 2014-07-10 DIAGNOSIS — H9192 Unspecified hearing loss, left ear: Secondary | ICD-10-CM | POA: Insufficient documentation

## 2014-07-10 DIAGNOSIS — Z7952 Long term (current) use of systemic steroids: Secondary | ICD-10-CM | POA: Diagnosis not present

## 2014-07-10 DIAGNOSIS — T1490XA Injury, unspecified, initial encounter: Secondary | ICD-10-CM

## 2014-07-10 DIAGNOSIS — X58XXXA Exposure to other specified factors, initial encounter: Secondary | ICD-10-CM | POA: Diagnosis not present

## 2014-07-10 DIAGNOSIS — Z720 Tobacco use: Secondary | ICD-10-CM | POA: Insufficient documentation

## 2014-07-10 LAB — COMPREHENSIVE METABOLIC PANEL
ALT: 18 U/L (ref 0–35)
AST: 27 U/L (ref 0–37)
Albumin: 3.9 g/dL (ref 3.5–5.2)
Alkaline Phosphatase: 55 U/L (ref 39–117)
Anion gap: 7 (ref 5–15)
BILIRUBIN TOTAL: 0.6 mg/dL (ref 0.3–1.2)
BUN: <5 mg/dL — ABNORMAL LOW (ref 6–23)
CO2: 26 mmol/L (ref 19–32)
CREATININE: 0.82 mg/dL (ref 0.50–1.10)
Calcium: 9.3 mg/dL (ref 8.4–10.5)
Chloride: 106 mEq/L (ref 96–112)
GFR, EST NON AFRICAN AMERICAN: 89 mL/min — AB (ref >90)
GLUCOSE: 99 mg/dL (ref 70–99)
Potassium: 3.4 mmol/L — ABNORMAL LOW (ref 3.5–5.1)
Sodium: 139 mmol/L (ref 135–145)
TOTAL PROTEIN: 7.1 g/dL (ref 6.0–8.3)

## 2014-07-10 LAB — I-STAT CG4 LACTIC ACID, ED: Lactic Acid, Venous: 1.03 mmol/L (ref 0.5–2.2)

## 2014-07-10 MED ORDER — HYDROCODONE-ACETAMINOPHEN 5-325 MG PO TABS: 2.0000 | ORAL_TABLET | ORAL | Status: DC | PRN

## 2014-07-10 MED ORDER — OXYCODONE-ACETAMINOPHEN 5-325 MG PO TABS
1.0000 | ORAL_TABLET | Freq: Once | ORAL | Status: AC
  Administered 2014-07-10: 1 via ORAL
  Filled 2014-07-10: qty 1

## 2014-07-10 NOTE — Progress Notes (Signed)
Orthopedic Tech Progress Note Patient Details:  Melissa Alvarez 06/29/1976 [id number] Applied static aluminum/foam finger splint to Rt. 4th finger.  Movement, sensation intact before and after application.  Capillary refill less than 2 seconds before and after application. Ortho Devices Type of Ortho Device: Finger splint Ortho Device/Splint Location: Rt. 4th finger. Ortho Device/Splint Interventions: Application   Darrol Poke 07/10/2014, 11:15 PM

## 2014-07-10 NOTE — ED Provider Notes (Signed)
CSN: [id number]     Arrival date & time 07/10/14  1752 History   First MD Initiated Contact with Patient 07/10/14 2142     Chief Complaint  Patient presents with  . Hand Pain  . Cough  . Otalgia     (Consider location/radiation/quality/duration/timing/severity/associated sxs/prior Treatment) HPI   39 year old female with history of cervical cancer, asthma, anemia, COPD presents with multiple complaints.  Patient states that for the past 2 weeks she has had hearing loss to her left ear. States initially she had a pop and then from that point on she hasn't been able to hear. She has tried to use earwax removal, occasional use Q-tip but without any improvement. She does not normally use Q-tip. She denies any drainage, ringing in ear, or ear pain. Patient also mentioned that 2 days ago she was trying to pulled her niece away from her mom and injured her R ring finger in the process.  She reports acute onset of sharp pain to her finger and noticed that her R finger was bent medially at the joint.  She was able to pop it back but since then she continues to endorse sharp shooting pain along with swelling to finger, with tingling sensation.  She is RHD.  She took some left over pain medication with some relief.  No other injury.    Past Medical History  Diagnosis Date  . Asthma   . Cancer     cervical  . Anemia   . History of blood transfusion   . Heart valve disorder   . COPD (chronic obstructive pulmonary disease)   . Gastric ulcer     x 5   Past Surgical History  Procedure Laterality Date  . Appendectomy    . Cholecystectomy    . Tonsillectomy    . Cervical cone biopsy      x 6  . Cesarean section      x 2  . Shoulder surgery Left    Family History  Problem Relation Age of Onset  . Cancer Mother   . Hypertension Mother   . Diabetes Mother   . CAD Mother   . Diabetes Other   . Hypertension Other   . Cancer Other   . CAD Other    History  Substance Use Topics  . Smoking  status: Current Every Day Smoker  . Smokeless tobacco: Not on file  . Alcohol Use: Yes     Comment: occasional   OB History    Gravida Para Term Preterm AB TAB SAB Ectopic Multiple Living   2 2 2             Review of Systems  Constitutional: Negative for fever.  HENT: Positive for hearing loss.   Musculoskeletal: Positive for joint swelling.  All other systems reviewed and are negative.     Allergies  Acetaminophen; Other; Penicillins; Doxycycline; Erythromycin; and Motrin  Home Medications   Prior to Admission medications   Medication Sig Start Date End Date Taking? Authorizing Provider  albuterol (PROVENTIL HFA;VENTOLIN HFA) 108 (90 BASE) MCG/ACT inhaler Inhale 2 puffs into the lungs once. 06/25/14   Kalman Drape, MD  albuterol (PROVENTIL) (2.5 MG/3ML) 0.083% nebulizer solution Take 3 mLs (2.5 mg total) by nebulization every 6 (six) hours as needed for wheezing. 06/25/14   Kalman Drape, MD  benzonatate (TESSALON) 100 MG capsule Take 1 capsule (100 mg total) by mouth every 8 (eight) hours. 06/25/14   Kalman Drape, MD  ferrous  sulfate (FERROUSUL) 325 (65 FE) MG tablet Take 1 tablet (325 mg total) by mouth 2 (two) times daily. 04/30/14   Lisa A Leftwich-Kirby, CNM  guaiFENesin (ROBITUSSIN) 100 MG/5ML liquid Take 5-10 mLs (100-200 mg total) by mouth every 4 (four) hours as needed for cough. 06/25/14   Kalman Drape, MD  ondansetron (ZOFRAN ODT) 8 MG disintegrating tablet Take 1 tablet (8 mg total) by mouth every 8 (eight) hours as needed for nausea or vomiting. 06/25/14   Kalman Drape, MD  oxyCODONE-acetaminophen (PERCOCET/ROXICET) 5-325 MG per tablet Take 2 tablets by mouth every 4 (four) hours as needed for severe pain. 06/25/14   Kalman Drape, MD  predniSONE (DELTASONE) 20 MG tablet Take 3 tablets (60 mg total) by mouth daily. 06/25/14   Kalman Drape, MD   BP 136/71 mmHg  Pulse 102  Temp(Src) 98 F (36.7 C) (Oral)  Resp 20  SpO2 100%  LMP 05/22/2014 Physical Exam   Constitutional: She is oriented to person, place, and time. She appears well-developed and well-nourished. No distress.  HENT:  Head: Atraumatic.  R ear: cerumen impaction noted, normal ear canal, unable to visualize TM, no tenderness to ear lobe  L ear: normal TM without perforation, erythema, inflammation of ear canal or pain to ear lob.    No TMJ  Eyes: Conjunctivae are normal.  Neck: Normal range of motion. Neck supple.  Cardiovascular: Normal rate and regular rhythm.   Pulmonary/Chest: Effort normal. She has no wheezes.  Abdominal: Soft. There is no tenderness.  Musculoskeletal: She exhibits tenderness (R ring finger: tenderness along finger most significant to PIP with moderate edema but without gross deformity, or signs of infection.  brisk cap refill.  Decreased  finger flexion/extension 2/2 pain.).  Lymphadenopathy:    She has no cervical adenopathy.  Neurological: She is alert and oriented to person, place, and time.  Skin: No rash noted.  Psychiatric: She has a normal mood and affect.  Nursing note and vitals reviewed.   ED Course  Procedures (including critical care time)  10:02 PM  patient complaining of left  Ear hearing loss. On examination, normal TM, no evidence of perforation and no evidence of cerumen impaction. ENT referral given for outpatient follow-up.   Patient also injured her right ring finger. X-ray without evidence of acute fracture or dislocation. Likely a finger sprain. Finger splint provided for support and will prescribe a short course of pain medication to use as needed. She is neurovascularly intact.  Labs Review Labs Reviewed  COMPREHENSIVE METABOLIC PANEL - Abnormal; Notable for the following:    Potassium 3.4 (*)    BUN <5 (*)    GFR calc non Af Amer 89 (*)    All other components within normal limits  I-STAT CG4 LACTIC ACID, ED    Imaging Review Dg Chest 2 View  07/10/2014   CLINICAL DATA:  Wheezing, shortness of breath x2 weeks  EXAM:  CHEST  2 VIEW  COMPARISON:  06/24/2014  FINDINGS: Lungs are clear.  No pleural effusion or pneumothorax.  The heart is normal in size.  Mild degenerative changes of the visualized thoracolumbar spine.  IMPRESSION: Normal chest radiographs.   Electronically Signed   By: Julian Hy M.D.   On: 07/10/2014 19:47   Dg Finger Ring Right  07/10/2014   CLINICAL DATA:  Twisting injury while wrestling 2 days prior  EXAM: RIGHT FOURTH FINGER 2+V  COMPARISON:  None.  FINDINGS: Frontal, oblique, and lateral views were obtained. There  is no apparent fracture or dislocation. There is swelling in the PIP joint region. Joint spaces appear intact. No erosive change.  IMPRESSION: Soft tissue swelling. Appreciable fracture or dislocation. No arthropathy.   Electronically Signed   By: Lowella Grip M.D.   On: 07/10/2014 19:48     EKG Interpretation None      MDM   Final diagnoses:  Sprain of right ring finger, initial encounter  Hearing loss in left ear    BP 145/88 mmHg  Pulse 87  Temp(Src) 98 F (36.7 C) (Oral)  Resp 20  SpO2 100%  LMP 05/22/2014      Domenic Moras, PA-C 07/10/14 2206  Johnna Acosta, MD 07/11/14 [id number]

## 2014-07-10 NOTE — ED Notes (Signed)
Ortho Tech paged to reapply finger splint

## 2014-07-10 NOTE — Discharge Instructions (Signed)
You have a sprain in your right ring finger.  Wear finger splint for protection.  Take pain medication as needed for pain. If you are unable to straighten your finger or bend your finger in 1 week, follow up with hand specialist (Dr. Fredna Dow) for further evaluation to ensure there are no tendon injury.  Follow up with ENT doctor for further evaluation of your hearing loss.  Finger Sprain A finger sprain happens when the bands of tissue that hold the finger bones together (ligaments) stretch too much and tear. HOME CARE  Keep your injured finger raised (elevated) when possible.  Put ice on the injured area, twice a day, for 2 to 3 days.  Put ice in a plastic bag.  Place a towel between your skin and the bag.  Leave the ice on for 15 minutes.  Only take medicine as told by your doctor.  Do not wear rings on the injured finger.  Protect your finger until pain and stiffness go away (usually 3 to 4 weeks).  Do not get your cast or splint to get wet. Cover your cast or splint with a plastic bag when you shower or bathe. Do not swim.  Your doctor may suggest special exercises for you to do. These exercises will help keep or stop stiffness from happening. GET HELP RIGHT AWAY IF:  Your cast or splint gets damaged.  Your pain gets worse, not better. MAKE SURE YOU:  Understand these instructions.  Will watch your condition.  Will get help right away if you are not doing well or get worse. Document Released: 07/22/2010 Document Revised: 09/11/2011 Document Reviewed: 02/20/2011 [facility] Patient Information 2015 [place], [place]. This information is not intended to replace advice given to you by your health care provider. Make sure you discuss any questions you have with your health care provider.

## 2014-07-10 NOTE — ED Notes (Addendum)
Pt in c/o cough and congestion for the last month, left earache, and injury to right middle finger on Wednesday, swelling noted, pt reports intermittent fever at home with chills and body aches

## 2014-07-18 ENCOUNTER — Encounter (HOSPITAL_COMMUNITY): Payer: Self-pay | Admitting: Emergency Medicine

## 2014-07-18 ENCOUNTER — Emergency Department (HOSPITAL_COMMUNITY)
Admission: EM | Admit: 2014-07-18 | Discharge: 2014-07-19 | Disposition: A | Discharge status: Home / Self Care | Payer: Medicare Other | Attending: Emergency Medicine | Admitting: Emergency Medicine

## 2014-07-18 ENCOUNTER — Emergency Department (HOSPITAL_COMMUNITY): Payer: Medicare Other

## 2014-07-18 DIAGNOSIS — Z720 Tobacco use: Secondary | ICD-10-CM | POA: Diagnosis not present

## 2014-07-18 DIAGNOSIS — Z3202 Encounter for pregnancy test, result negative: Secondary | ICD-10-CM | POA: Insufficient documentation

## 2014-07-18 DIAGNOSIS — Z8679 Personal history of other diseases of the circulatory system: Secondary | ICD-10-CM | POA: Insufficient documentation

## 2014-07-18 DIAGNOSIS — R102 Pelvic and perineal pain: Secondary | ICD-10-CM | POA: Diagnosis not present

## 2014-07-18 DIAGNOSIS — D508 Other iron deficiency anemias: Secondary | ICD-10-CM | POA: Diagnosis not present

## 2014-07-18 DIAGNOSIS — Z9049 Acquired absence of other specified parts of digestive tract: Secondary | ICD-10-CM | POA: Insufficient documentation

## 2014-07-18 DIAGNOSIS — Z79899 Other long term (current) drug therapy: Secondary | ICD-10-CM | POA: Diagnosis not present

## 2014-07-18 DIAGNOSIS — J449 Chronic obstructive pulmonary disease, unspecified: Secondary | ICD-10-CM | POA: Insufficient documentation

## 2014-07-18 DIAGNOSIS — Z880 Allergy status to penicillin: Secondary | ICD-10-CM | POA: Insufficient documentation

## 2014-07-18 DIAGNOSIS — D649 Anemia, unspecified: Secondary | ICD-10-CM | POA: Insufficient documentation

## 2014-07-18 DIAGNOSIS — D259 Leiomyoma of uterus, unspecified: Secondary | ICD-10-CM | POA: Diagnosis not present

## 2014-07-18 DIAGNOSIS — Z8719 Personal history of other diseases of the digestive system: Secondary | ICD-10-CM | POA: Insufficient documentation

## 2014-07-18 DIAGNOSIS — N858 Other specified noninflammatory disorders of uterus: Secondary | ICD-10-CM | POA: Diagnosis not present

## 2014-07-18 DIAGNOSIS — Z8541 Personal history of malignant neoplasm of cervix uteri: Secondary | ICD-10-CM | POA: Diagnosis not present

## 2014-07-18 DIAGNOSIS — M79644 Pain in right finger(s): Secondary | ICD-10-CM | POA: Insufficient documentation

## 2014-07-18 DIAGNOSIS — R1032 Left lower quadrant pain: Secondary | ICD-10-CM | POA: Diagnosis present

## 2014-07-18 DIAGNOSIS — S63614A Unspecified sprain of right ring finger, initial encounter: Secondary | ICD-10-CM | POA: Diagnosis not present

## 2014-07-18 LAB — CBC WITH DIFFERENTIAL/PLATELET
Basophils Absolute: 0 10*3/uL (ref 0.0–0.1)
Basophils Relative: 0 % (ref 0–1)
EOS PCT: 2 % (ref 0–5)
Eosinophils Absolute: 0.2 10*3/uL (ref 0.0–0.7)
HCT: 27.8 % — ABNORMAL LOW (ref 36.0–46.0)
Hemoglobin: 7.7 g/dL — ABNORMAL LOW (ref 12.0–15.0)
Lymphocytes Relative: 29 % (ref 12–46)
Lymphs Abs: 2.2 10*3/uL (ref 0.7–4.0)
MCH: 17.7 pg — ABNORMAL LOW (ref 26.0–34.0)
MCHC: 27.7 g/dL — ABNORMAL LOW (ref 30.0–36.0)
MCV: 63.9 fL — ABNORMAL LOW (ref 78.0–100.0)
MONO ABS: 0.5 10*3/uL (ref 0.1–1.0)
MONOS PCT: 7 % (ref 3–12)
Neutro Abs: 4.8 10*3/uL (ref 1.7–7.7)
Neutrophils Relative %: 62 % (ref 43–77)
Platelets: 318 10*3/uL (ref 150–400)
RBC: 4.35 MIL/uL (ref 3.87–5.11)
RDW: 20.3 % — AB (ref 11.5–15.5)
WBC: 7.7 10*3/uL (ref 4.0–10.5)

## 2014-07-18 LAB — WET PREP, GENITAL
Clue Cells Wet Prep HPF POC: NONE SEEN
TRICH WET PREP: NONE SEEN
Yeast Wet Prep HPF POC: NONE SEEN

## 2014-07-18 LAB — COMPREHENSIVE METABOLIC PANEL
ALT: 19 U/L (ref 0–35)
ANION GAP: 1 — AB (ref 5–15)
AST: 25 U/L (ref 0–37)
Albumin: 3.9 g/dL (ref 3.5–5.2)
Alkaline Phosphatase: 57 U/L (ref 39–117)
BUN: 5 mg/dL — ABNORMAL LOW (ref 6–23)
CALCIUM: 8.5 mg/dL (ref 8.4–10.5)
CO2: 24 mmol/L (ref 19–32)
Chloride: 109 mEq/L (ref 96–112)
Creatinine, Ser: 0.65 mg/dL (ref 0.50–1.10)
GFR calc Af Amer: >90 mL/min (ref >90)
GFR calc non Af Amer: >90 mL/min (ref >90)
Glucose, Bld: 116 mg/dL — ABNORMAL HIGH (ref 70–99)
Potassium: 3.8 mmol/L (ref 3.5–5.1)
SODIUM: 134 mmol/L — AB (ref 135–145)
Total Bilirubin: 0.4 mg/dL (ref 0.3–1.2)
Total Protein: 6.9 g/dL (ref 6.0–8.3)

## 2014-07-18 LAB — POC URINE PREG, ED: PREG TEST UR: NEGATIVE

## 2014-07-18 LAB — URINALYSIS, ROUTINE W REFLEX MICROSCOPIC
Bilirubin Urine: NEGATIVE
Glucose, UA: NEGATIVE mg/dL
Hgb urine dipstick: NEGATIVE
Ketones, ur: NEGATIVE mg/dL
LEUKOCYTES UA: NEGATIVE
NITRITE: NEGATIVE
PROTEIN: NEGATIVE mg/dL
Specific Gravity, Urine: 1.022 (ref 1.005–1.030)
UROBILINOGEN UA: 0.2 mg/dL (ref 0.0–1.0)
pH: 6 (ref 5.0–8.0)

## 2014-07-18 LAB — LIPASE, BLOOD: Lipase: 26 U/L (ref 11–59)

## 2014-07-18 MED ORDER — HYDROMORPHONE HCL 1 MG/ML IJ SOLN
1.0000 mg | Freq: Once | INTRAMUSCULAR | Status: AC
  Administered 2014-07-18: 1 mg via INTRAVENOUS
  Filled 2014-07-18: qty 1

## 2014-07-18 MED ORDER — ONDANSETRON HCL 4 MG/2ML IJ SOLN
4.0000 mg | Freq: Once | INTRAMUSCULAR | Status: AC
  Administered 2014-07-18: 4 mg via INTRAVENOUS
  Filled 2014-07-18: qty 2

## 2014-07-18 NOTE — ED Notes (Addendum)
Pt from home right ring finger pain in which she was seen for on 07/10/14 for same.  She is also c/o left lower abdominal pain since yesterday.

## 2014-07-18 NOTE — ED Notes (Signed)
Pt called from lobby with no response. 

## 2014-07-18 NOTE — ED Provider Notes (Addendum)
CSN: [id number]     Arrival date & time 07/18/14  1850 History   First MD Initiated Contact with Patient 07/18/14 2107     Chief Complaint  Patient presents with  . Finger Injury  . Abdominal Pain     (Consider location/radiation/quality/duration/timing/severity/associated sxs/prior Treatment) Patient is a 39 y.o. female presenting with abdominal pain.  Abdominal Pain  Complains of left lower quadrant pain onset 3 days ago, constant nonradiating sharp nothing makes symptoms better or worse. Patient had similar pain in December 2015. She was seen at women's [facility] admissions unit. Referred for outpatient follow-up after discussion with gynecologist . She treated herself with Percocet tonight without adequate pain relief. Patient also reports pain at right ring finger Past Medical History  Diagnosis Date  . Asthma   . Cancer     cervical  . Anemia   . History of blood transfusion   . Heart valve disorder   . COPD (chronic obstructive pulmonary disease)   . Gastric ulcer     x 5   history of present illness continued. She also complains of pain at right ring finger as result of an injury which occurred January 4. She was seen in the emergency department January 8 determined to have no fracture. She was splinted and referred for follow-up with orthopedics. She has not yet obtained follow-up. Last menstrual period One week ago. Heavier than normal. No vaginal discharge no urinary symptoms. Past Surgical History  Procedure Laterality Date  . Appendectomy    . Cholecystectomy    . Tonsillectomy    . Cervical cone biopsy      x 6  . Cesarean section      x 2  . Shoulder surgery Left    Family History  Problem Relation Age of Onset  . Cancer Mother   . Hypertension Mother   . Diabetes Mother   . CAD Mother   . Diabetes Other   . Hypertension Other   . Cancer Other   . CAD Other    History  Substance Use Topics  . Smoking status: Current Every Day Smoker  .  Smokeless tobacco: Not on file  . Alcohol Use: Yes     Comment: occasional   OB History    Gravida Para Term Preterm AB TAB SAB Ectopic Multiple Living   2 2 2             Review of Systems  Gastrointestinal: Positive for abdominal pain.  Genitourinary: Positive for menstrual problem.  Musculoskeletal:       Right ring finger swollen and painful      Allergies  Acetaminophen; Other; Penicillins; Doxycycline; Erythromycin; and Motrin  Home Medications   Prior to Admission medications   Medication Sig Start Date End Date Taking? Authorizing Provider  albuterol (PROVENTIL HFA;VENTOLIN HFA) 108 (90 BASE) MCG/ACT inhaler Inhale 2 puffs into the lungs once. 06/25/14  Yes Kalman Drape, MD  albuterol (PROVENTIL) (2.5 MG/3ML) 0.083% nebulizer solution Take 3 mLs (2.5 mg total) by nebulization every 6 (six) hours as needed for wheezing. 06/25/14  Yes Kalman Drape, MD  benzonatate (TESSALON) 100 MG capsule Take 1 capsule (100 mg total) by mouth every 8 (eight) hours. Patient not taking: Reported on 07/18/2014 06/25/14   Kalman Drape, MD  ferrous sulfate (FERROUSUL) 325 (65 FE) MG tablet Take 1 tablet (325 mg total) by mouth 2 (two) times daily. Patient not taking: Reported on 07/18/2014 04/30/14   Elvera Maria, CNM  guaiFENesin (ROBITUSSIN) 100 MG/5ML liquid Take 5-10 mLs (100-200 mg total) by mouth every 4 (four) hours as needed for cough. Patient not taking: Reported on 07/18/2014 06/25/14   Kalman Drape, MD  HYDROcodone-acetaminophen (NORCO/VICODIN) 5-325 MG per tablet Take 2 tablets by mouth every 4 (four) hours as needed for severe pain. Patient not taking: Reported on 07/18/2014 07/10/14   Domenic Moras, PA-C  ondansetron (ZOFRAN ODT) 8 MG disintegrating tablet Take 1 tablet (8 mg total) by mouth every 8 (eight) hours as needed for nausea or vomiting. Patient not taking: Reported on 07/18/2014 06/25/14   Kalman Drape, MD  predniSONE (DELTASONE) 20 MG tablet Take 3 tablets (60 mg total)  by mouth daily. Patient not taking: Reported on 07/18/2014 06/25/14   Kalman Drape, MD   LMP 05/17/2014 Physical Exam  Constitutional: She appears well-developed and well-nourished. She appears distressed.  Appears mildly uncomfortable  HENT:  Head: Normocephalic and atraumatic.  Eyes: Conjunctivae are normal. Pupils are equal, round, and reactive to light.  Neck: Neck supple. No tracheal deviation present. No thyromegaly present.  Cardiovascular: Normal rate and regular rhythm.   No murmur heard. Pulmonary/Chest: Effort normal and breath sounds normal.  Abdominal: Soft. Bowel sounds are normal. She exhibits no distension. There is tenderness.  Obese, mildly tender left lower quadrant  Genitourinary:  No external lesion. No discharge. No cervical motion tenderness. Cervical os closed. Left adnexaland tenderness.  Musculoskeletal: Normal range of motion. She exhibits no edema or tenderness.  Neurological: She is alert. Coordination normal.  Skin: Skin is warm and dry. No rash noted.  Psychiatric: She has a normal mood and affect.  Nursing note and vitals reviewed.   ED Course  Procedures (including critical care time) Labs Review Labs Reviewed  CBC WITH DIFFERENTIAL - Abnormal; Notable for the following:    Hemoglobin 7.7 (*)    HCT 27.8 (*)    MCV 63.9 (*)    MCH 17.7 (*)    MCHC 27.7 (*)    RDW 20.3 (*)    All other components within normal limits  COMPREHENSIVE METABOLIC PANEL - Abnormal; Notable for the following:    Sodium 134 (*)    Glucose, Bld 116 (*)    BUN 5 (*)    Anion gap 1 (*)    All other components within normal limits  LIPASE, BLOOD  URINALYSIS, ROUTINE W REFLEX MICROSCOPIC    Imaging Review No results found.   EKG Interpretation None      splint was placed on patient's ring finger right hand by the orthopedic technician. Splint is comfortable for patient. At 11 PM she reports that her pain was transiently improved after treatment with intravenous  hydromorphone. She is requesting more pain medicine. Additional hydromorphone ordered . 12:15 AM pain somewhat worsened after pelvic ultrasound. Nurse reports the patient vomited one time after receiving second dose of hydromorphone. Additional hydromorphone and intravenous Zofran ordered. Results for orders placed or performed during the hospital encounter of 07/18/14  Wet prep, genital  Result Value Ref Range   Yeast Wet Prep HPF POC NONE SEEN NONE SEEN   Trich, Wet Prep NONE SEEN NONE SEEN   Clue Cells Wet Prep HPF POC NONE SEEN NONE SEEN   WBC, Wet Prep HPF POC RARE (A) NONE SEEN  CBC with Differential  Result Value Ref Range   WBC 7.7 4.0 - 10.5 K/uL   RBC 4.35 3.87 - 5.11 MIL/uL   Hemoglobin 7.7 (L) 12.0 - 15.0 g/dL  HCT 27.8 (L) 36.0 - 46.0 %   MCV 63.9 (L) 78.0 - 100.0 fL   MCH 17.7 (L) 26.0 - 34.0 pg   MCHC 27.7 (L) 30.0 - 36.0 g/dL   RDW 20.3 (H) 11.5 - 15.5 %   Platelets 318 150 - 400 K/uL   Neutrophils Relative % 62 43 - 77 %   Lymphocytes Relative 29 12 - 46 %   Monocytes Relative 7 3 - 12 %   Eosinophils Relative 2 0 - 5 %   Basophils Relative 0 0 - 1 %   Neutro Abs 4.8 1.7 - 7.7 K/uL   Lymphs Abs 2.2 0.7 - 4.0 K/uL   Monocytes Absolute 0.5 0.1 - 1.0 K/uL   Eosinophils Absolute 0.2 0.0 - 0.7 K/uL   Basophils Absolute 0.0 0.0 - 0.1 K/uL   RBC Morphology SCHISTOCYTES PRESENT (2-5/hpf)    Smear Review LARGE PLATELETS PRESENT   Comprehensive metabolic panel  Result Value Ref Range   Sodium 134 (L) 135 - 145 mmol/L   Potassium 3.8 3.5 - 5.1 mmol/L   Chloride 109 96 - 112 mEq/L   CO2 24 19 - 32 mmol/L   Glucose, Bld 116 (H) 70 - 99 mg/dL   BUN 5 (L) 6 - 23 mg/dL   Creatinine, Ser 0.65 0.50 - 1.10 mg/dL   Calcium 8.5 8.4 - 10.5 mg/dL   Total Protein 6.9 6.0 - 8.3 g/dL   Albumin 3.9 3.5 - 5.2 g/dL   AST 25 0 - 37 U/L   ALT 19 0 - 35 U/L   Alkaline Phosphatase 57 39 - 117 U/L   Total Bilirubin 0.4 0.3 - 1.2 mg/dL   GFR calc non Af Amer >90 >90 mL/min   GFR calc  Af Amer >90 >90 mL/min   Anion gap 1 (L) 5 - 15  Lipase, blood  Result Value Ref Range   Lipase 26 11 - 59 U/L  Urinalysis, Routine w reflex microscopic  Result Value Ref Range   Color, Urine YELLOW YELLOW   APPearance CLEAR CLEAR   Specific Gravity, Urine 1.022 1.005 - 1.030   pH 6.0 5.0 - 8.0   Glucose, UA NEGATIVE NEGATIVE mg/dL   Hgb urine dipstick NEGATIVE NEGATIVE   Bilirubin Urine NEGATIVE NEGATIVE   Ketones, ur NEGATIVE NEGATIVE mg/dL   Protein, ur NEGATIVE NEGATIVE mg/dL   Urobilinogen, UA 0.2 0.0 - 1.0 mg/dL   Nitrite NEGATIVE NEGATIVE   Leukocytes, UA NEGATIVE NEGATIVE  POC urine preg, ED (not at [facility])  Result Value Ref Range   Preg Test, Ur NEGATIVE NEGATIVE   Dg Chest 2 View  07/10/2014   CLINICAL DATA:  Wheezing, shortness of breath x2 weeks  EXAM: CHEST  2 VIEW  COMPARISON:  06/24/2014  FINDINGS: Lungs are clear.  No pleural effusion or pneumothorax.  The heart is normal in size.  Mild degenerative changes of the visualized thoracolumbar spine.  IMPRESSION: Normal chest radiographs.   Electronically Signed   By: Julian Hy M.D.   On: 07/10/2014 19:47   Dg Chest 2 View  06/25/2014   CLINICAL DATA:  Cough, shortness of breath, abdominal pain  EXAM: CHEST  2 VIEW  COMPARISON:  09/22/2013  FINDINGS: The heart size and mediastinal contours are within normal limits. Lungs are hypoaerated with crowding of the bronchovascular markings. No focal pulmonary opacity. Minimal central bronchial wall thickening may be seen with reactive airways disease or bronchitis. The visualized skeletal structures are unremarkable.  IMPRESSION: Mild central bronchial  wall thickening which may be seen with reactive airways disease or bronchitis.   Electronically Signed   By: Conchita Paris M.D.   On: 06/25/2014 00:16   Ct Abdomen Pelvis W Contrast  06/25/2014   CLINICAL DATA:  Epigastric abdominal pain. Patient reports a history of an ulcer.  EXAM: CT ABDOMEN AND PELVIS WITH CONTRAST   TECHNIQUE: Multidetector CT imaging of the abdomen and pelvis was performed using the standard protocol following bolus administration of intravenous contrast.  CONTRAST:  17mL OMNIPAQUE IOHEXOL 300 MG/ML  SOLN  COMPARISON:  Pelvic ultrasound 04/30/2014, CT abdomen/ pelvis 06/05/2007  FINDINGS: Minimal curvilinear bilateral lower lobe atelectasis.  Mild central intrahepatic ductal prominence is identified with mild fusiform prominence of the mid common duct measuring 1.1 cm with tapering to the level of the ampulla. No radiopaque filling defect. No pancreatic ductal dilatation. Adrenal glands, kidneys, spleen, and pancreas are normal.  The right ovary is visualized on image 61, with a lobulated soft tissue mass chest anterior to it within the right adnexa, measuring 6.3 x 3.9 cm image 62. The uterus is anteverted and retroflexed. Left ovary is normal. Areas of inhomogeneous masslike myometrial enhancement likely represent underlying fibroids as previously visualized. A probable dominant fundal fibroid is noted measuring 6.5 x 5.4 x 6.9 cm. Small amount of free pelvic fluid is identified. Appendix is surgically absent, not visualized.  No bowel wall thickening or focal segmental dilatation is identified. Within the anterior midline abdominal wall, there is an irregular slightly hyperdense masslike area measuring 1.8 x 2.1 cm image 67. This is present on the prior exam and likely represents scarring although an endometrioma could theoretically appear similar. There is no other CT evidence for endometriosis.  No acute osseus finding.  IMPRESSION: No acute intra-abdominal or pelvic pathology.  Interval increase in size of right adnexal soft tissue mass, most likely a broad ligament fibroid. This does confers a risk of torsion and necrosis given probable thin pedicle attachment to the uterus. This could be further evaluated at nonemergent outpatient pelvic MRI with contrast, in order to define the anatomic area of  origin and exclude an ovarian origin mass.  Fibroid uterus.   Electronically Signed   By: Conchita Paris M.D.   On: 06/25/2014 02:14   Dg Finger Ring Right  07/10/2014   CLINICAL DATA:  Twisting injury while wrestling 2 days prior  EXAM: RIGHT FOURTH FINGER 2+V  COMPARISON:  None.  FINDINGS: Frontal, oblique, and lateral views were obtained. There is no apparent fracture or dislocation. There is swelling in the PIP joint region. Joint spaces appear intact. No erosive change.  IMPRESSION: Soft tissue swelling. Appreciable fracture or dislocation. No arthropathy.   Electronically Signed   By: Lowella Grip M.D.   On: 07/10/2014 19:48    MDM  Spoke with Monna Fam, nurse practitioner [facility]. Patient can be seen at [facility] hospital clinic within the next 2 weeks patient is signed out to Dr.Oni who will reevaluate patient and check ultrasound. Patient's anemia is chronic. I anticipate discharge prescriptions for ferrous sulfate, Percocet. She has arranged follow-up with hand surgeon for finger injury Diagnosis #1pelvic pain #2 microcytic anemia #3 sprain of right ring finger Final diagnoses:  None        Orlie Dakin, MD 07/19/14 0030  Orlie Dakin, MD 07/19/14 [id number]

## 2014-07-19 DIAGNOSIS — D259 Leiomyoma of uterus, unspecified: Secondary | ICD-10-CM | POA: Diagnosis not present

## 2014-07-19 DIAGNOSIS — R102 Pelvic and perineal pain: Secondary | ICD-10-CM | POA: Diagnosis not present

## 2014-07-19 MED ORDER — HYDROMORPHONE HCL 1 MG/ML IJ SOLN
1.0000 mg | Freq: Once | INTRAMUSCULAR | Status: AC
  Administered 2014-07-19: 1 mg via INTRAVENOUS
  Filled 2014-07-19: qty 1

## 2014-07-19 MED ORDER — ONDANSETRON HCL 4 MG/2ML IJ SOLN
4.0000 mg | Freq: Once | INTRAMUSCULAR | Status: AC
  Administered 2014-07-19: 4 mg via INTRAVENOUS
  Filled 2014-07-19: qty 2

## 2014-07-19 MED ORDER — OXYCODONE HCL 5 MG PO TABS: 5.0000 mg | ORAL_TABLET | Freq: Four times a day (QID) | ORAL | Status: DC | PRN

## 2014-07-19 NOTE — Discharge Instructions (Signed)
Pelvic Pain  Take the medication prescribed as needed for pain. Call the women's hospital clinic on Monday, 07/20/2014 schedule the next available appointment. Tell office staff that [place] spoke with Monna Fam about your case and you came to the emergency department tonight . Keep your scheduled appointment with the hand specialist you were previously referred to your last emergency department visit Female pelvic pain can be caused by many different things and start from a variety of places. Pelvic pain refers to pain that is located in the lower half of the abdomen and between your hips. The pain may occur over a short period of time (acute) or may be reoccurring (chronic). The cause of pelvic pain may be related to disorders affecting the female reproductive organs (gynecologic), but it may also be related to the bladder, kidney stones, an intestinal complication, or muscle or skeletal problems. Getting help right away for pelvic pain is important, especially if there has been severe, sharp, or a sudden onset of unusual pain. It is also important to get help right away because some types of pelvic pain can be life threatening.  CAUSES  Below are only some of the causes of pelvic pain. The causes of pelvic pain can be in one of several categories.   Gynecologic.  Pelvic inflammatory disease.  Sexually transmitted infection.  Ovarian cyst or a twisted ovarian ligament (ovarian torsion).  Uterine lining that grows outside the uterus (endometriosis).  Fibroids, cysts, or tumors.  Ovulation.  Pregnancy.  Pregnancy that occurs outside the uterus (ectopic pregnancy).  Miscarriage.  Labor.  Abruption of the placenta or ruptured uterus.  Infection.  Uterine infection (endometritis).  Bladder infection.  Diverticulitis.  Miscarriage related to a uterine infection (septic abortion).  Bladder.  Inflammation of the bladder (cystitis).  Kidney  stone(s).  Gastrointestinal.  Constipation.  Diverticulitis.  Neurologic.  Trauma.  Feeling pelvic pain because of mental or emotional causes (psychosomatic).  Cancers of the bowel or pelvis. EVALUATION  Your caregiver will want to take a careful history of your concerns. This includes recent changes in your health, a careful gynecologic history of your periods (menses), and a sexual history. Obtaining your family history and medical history is also important. Your caregiver may suggest a pelvic exam. A pelvic exam will help identify the location and severity of the pain. It also helps in the evaluation of which organ system may be involved. In order to identify the cause of the pelvic pain and be properly treated, your caregiver may order tests. These tests may include:   A pregnancy test.  Pelvic ultrasonography.  An X-ray exam of the abdomen.  A urinalysis or evaluation of vaginal discharge.  Blood tests. HOME CARE INSTRUCTIONS   Only take over-the-counter or prescription medicines for pain, discomfort, or fever as directed by your caregiver.   Rest as directed by your caregiver.   Eat a balanced diet.   Drink enough fluids to make your urine clear or pale yellow, or as directed.   Avoid sexual intercourse if it causes pain.   Apply warm or cold compresses to the lower abdomen depending on which one helps the pain.   Avoid stressful situations.   Keep a journal of your pelvic pain. Write down when it started, where the pain is located, and if there are things that seem to be associated with the pain, such as food or your menstrual cycle.  Follow up with your caregiver as directed.  SEEK MEDICAL CARE IF:  Your medicine  does not help your pain.  You have abnormal vaginal discharge. SEEK IMMEDIATE MEDICAL CARE IF:   You have heavy bleeding from the vagina.   Your pelvic pain increases.   You feel light-headed or faint.   You have chills.   You  have pain with urination or blood in your urine.   You have uncontrolled diarrhea or vomiting.   You have a fever or persistent symptoms for more than 3 days.  You have a fever and your symptoms suddenly get worse.   You are being physically or sexually abused.  MAKE SURE YOU:  Understand these instructions.  Will watch your condition.  Will get help if you are not doing well or get worse. Document Released: 05/16/2004 Document Revised: 11/03/2013 Document Reviewed: 10/09/2011 [facility] Patient Information 2015 [place], [place]. This information is not intended to replace advice given to you by your health care provider. Make sure you discuss any questions you have with your health care provider.

## 2014-07-20 LAB — GC/CHLAMYDIA PROBE AMP (~~LOC~~) NOT AT ARMC
Chlamydia: NEGATIVE
NEISSERIA GONORRHEA: NEGATIVE

## 2014-07-20 LAB — HIV ANTIBODY (ROUTINE TESTING W REFLEX)
HIV 1/O/2 Abs-Index Value: <1 (ref <1.00)
HIV-1/HIV-2 Ab: NONREACTIVE

## 2014-07-27 LAB — RPR: RPR: NONREACTIVE

## 2014-08-19 ENCOUNTER — Encounter: Payer: Medicare Other | Admitting: Obstetrics & Gynecology

## 2014-08-21 ENCOUNTER — Encounter: Payer: Medicare Other | Admitting: Obstetrics & Gynecology

## 2014-10-02 ENCOUNTER — Encounter: Payer: Medicare Other | Admitting: Obstetrics & Gynecology

## 2014-10-12 ENCOUNTER — Other Ambulatory Visit (HOSPITAL_COMMUNITY)
Admission: RE | Admit: 2014-10-12 | Discharge: 2014-10-12 | Disposition: A | Discharge status: Home / Self Care | Payer: Medicare Other | Source: Ambulatory Visit | Attending: Obstetrics & Gynecology | Admitting: Obstetrics & Gynecology

## 2014-10-12 ENCOUNTER — Encounter: Payer: Self-pay | Admitting: Obstetrics & Gynecology

## 2014-10-12 ENCOUNTER — Ambulatory Visit (INDEPENDENT_AMBULATORY_CARE_PROVIDER_SITE_OTHER): Payer: Medicare Other | Admitting: Obstetrics & Gynecology

## 2014-10-12 VITALS — BP 123/67 | HR 75 | Temp 98.6°F | Ht 66.0 in | Wt 184.6 lb

## 2014-10-12 DIAGNOSIS — Z124 Encounter for screening for malignant neoplasm of cervix: Secondary | ICD-10-CM | POA: Insufficient documentation

## 2014-10-12 DIAGNOSIS — R102 Pelvic and perineal pain: Secondary | ICD-10-CM

## 2014-10-12 DIAGNOSIS — R87810 Cervical high risk human papillomavirus (HPV) DNA test positive: Secondary | ICD-10-CM | POA: Insufficient documentation

## 2014-10-12 DIAGNOSIS — Z1151 Encounter for screening for human papillomavirus (HPV): Secondary | ICD-10-CM

## 2014-10-12 DIAGNOSIS — Z113 Encounter for screening for infections with a predominantly sexual mode of transmission: Secondary | ICD-10-CM | POA: Diagnosis not present

## 2014-10-12 DIAGNOSIS — Z118 Encounter for screening for other infectious and parasitic diseases: Secondary | ICD-10-CM | POA: Diagnosis not present

## 2014-10-12 DIAGNOSIS — N921 Excessive and frequent menstruation with irregular cycle: Secondary | ICD-10-CM

## 2014-10-12 MED ORDER — MEGESTROL ACETATE 20 MG PO TABS: 40.0000 mg | ORAL_TABLET | Freq: Every day | ORAL | Status: DC

## 2014-10-12 MED ORDER — TRAMADOL HCL 50 MG PO TABS: 50.0000 mg | ORAL_TABLET | Freq: Four times a day (QID) | ORAL | Status: DC | PRN

## 2014-10-12 MED ORDER — NAPROXEN 500 MG PO TABS: 500.0000 mg | ORAL_TABLET | Freq: Two times a day (BID) | ORAL | Status: DC

## 2014-10-12 NOTE — Patient Instructions (Addendum)
Uterine Fibroid A uterine fibroid is a growth (tumor) that occurs in your uterus. This type of tumor is not cancerous and does not spread out of the uterus. You can have one or many fibroids. Fibroids can vary in size, weight, and where they grow in the uterus. Some can become quite large. Most fibroids do not require medical treatment, but some can cause pain or heavy bleeding during and between periods. CAUSES  A fibroid is the result of a single uterine cell that keeps growing (unregulated), which is different than most cells in the human body. Most cells have a control mechanism that keeps them from reproducing without control.  SIGNS AND SYMPTOMS   Bleeding.  Pelvic pain and pressure.  Bladder problems due to the size of the fibroid.  Infertility and miscarriages depending on the size and location of the fibroid. DIAGNOSIS  Uterine fibroids are diagnosed through a physical exam. Your health care provider may feel the lumpy tumors during a pelvic exam. Ultrasonography may be done to get information regarding size, location, and number of tumors.  TREATMENT   Your health care provider may recommend watchful waiting. This involves getting the fibroid checked by your health care provider to see if it grows or shrinks.   Hormone treatment or an intrauterine device (IUD) may be prescribed.   Surgery may be needed to remove the fibroids (myomectomy) or the uterus (hysterectomy). This depends on your situation. When fibroids interfere with fertility and a woman wants to become pregnant, a health care provider may recommend having the fibroids removed.  [place] care depends on how you were treated. In general:   Keep all follow-up appointments with your health care provider.   Only take over-the-counter or prescription medicines as directed by your health care provider. If you were prescribed a hormone treatment, take the hormone medicines exactly as directed. Do not  take aspirin. It can cause bleeding.   Talk to your health care provider about taking iron pills.  If your periods are troublesome but not so heavy, lie down with your feet raised slightly above your heart. Place cold packs on your lower abdomen.   If your periods are heavy, write down the number of pads or tampons you use per month. Bring this information to your health care provider.   Include green vegetables in your diet.  SEEK IMMEDIATE MEDICAL CARE IF:  You have pelvic pain or cramps not controlled with medicines.   You have a sudden increase in pelvic pain.   You have an increase in bleeding between and during periods.   You have excessive periods and soak tampons or pads in a half hour or less.  You feel lightheaded or have fainting episodes. Document Released: 06/16/2000 Document Revised: 04/09/2013 Document Reviewed: 01/16/2013 [facility] Patient Information 2015 [place], [place]. This information is not intended to replace advice given to you by your health care provider. Make sure you discuss any questions you have with your health care provider. Hysterectomy Information  A hysterectomy is a surgery in which your uterus is removed. This surgery may be done to treat various medical problems. After the surgery, you will no longer have menstrual periods. The surgery will also make you unable to become pregnant (sterile). The fallopian tubes and ovaries can be removed (bilateral salpingo-oophorectomy) during this surgery as well.  REASONS FOR A HYSTERECTOMY  Persistent, abnormal bleeding.  Lasting (chronic) pelvic pain or infection.  The lining of the uterus (endometrium) starts growing outside  the uterus (endometriosis).  The endometrium starts growing in the muscle of the uterus (adenomyosis).  The uterus falls down into the vagina (pelvic organ prolapse).  Noncancerous growths in the uterus (uterine fibroids) that cause symptoms.  Precancerous cells.  Cervical  cancer or uterine cancer. TYPES OF HYSTERECTOMIES  Supracervical hysterectomy--In this type, the top part of the uterus is removed, but not the cervix.  Total hysterectomy--The uterus and cervix are removed.  Radical hysterectomy--The uterus, the cervix, and the fibrous tissue that holds the uterus in place in the pelvis (parametrium) are removed. WAYS A HYSTERECTOMY CAN BE PERFORMED  Abdominal hysterectomy--A large surgical cut (incision) is made in the abdomen. The uterus is removed through this incision.  Vaginal hysterectomy--An incision is made in the vagina. The uterus is removed through this incision. There are no abdominal incisions.  Conventional laparoscopic hysterectomy--Three or four small incisions are made in the abdomen. A thin, lighted tube with a camera (laparoscope) is inserted into one of the incisions. Other tools are put through the other incisions. The uterus is cut into small pieces. The small pieces are removed through the incisions, or they are removed through the vagina.  Laparoscopically assisted vaginal hysterectomy (LAVH)--Three or four small incisions are made in the abdomen. Part of the surgery is performed laparoscopically and part vaginally. The uterus is removed through the vagina.  Robot-assisted laparoscopic hysterectomy--A laparoscope and other tools are inserted into 3 or 4 small incisions in the abdomen. A computer-controlled device is used to give the surgeon a 3D image and to help control the surgical instruments. This allows for more precise movements of surgical instruments. The uterus is cut into small pieces and removed through the incisions or removed through the vagina. RISKS AND COMPLICATIONS  Possible complications associated with this procedure include:  Bleeding and risk of blood transfusion. Tell your health care provider if you do not want to receive any blood products.  Blood clots in the legs or lung.  Infection.  Injury to  surrounding organs.  Problems or side effects related to anesthesia.  Conversion to an abdominal hysterectomy from one of the other techniques. WHAT TO EXPECT AFTER A HYSTERECTOMY  You will be given pain medicine.  You will need to have someone with you for the first 3-5 days after you go home.  You will need to follow up with your surgeon in 2-4 weeks after surgery to evaluate your progress.  You may have early menopause symptoms such as hot flashes, night sweats, and insomnia.  If you had a hysterectomy for a problem that was not cancer or not a condition that could lead to cancer, then you no longer need Pap tests. However, even if you no longer need a Pap test, a regular exam is a good idea to make sure no other problems are starting. Document Released: 12/13/2000 Document Revised: 04/09/2013 Document Reviewed: 02/24/2013 [facility] Patient Information 2015 [place], [place]. This information is not intended to replace advice given to you by your health care provider. Make sure you discuss any questions you have with your health care provider.

## 2014-10-12 NOTE — Progress Notes (Signed)
Subjective:     Patient ID: Melissa Alvarez, female   DOB: 1976-02-03, 39 y.o.   MRN: [id number]  HPI [id number]. Pt has been referred for eval of abd/pelvoic pain and fibrodi.  She reports that she looks 6 months pregnant and reports that she was told that her fibroids are gorwing.  She was told that she had fibroids in Oct. Pain is a 'sharp stabbing pain' that  Occurs daily someetimes  Some thtime sit is just a dull aching pain. Pt reoplrts that the pain is worse with leaning backwards.  The pain has been worse for the past 3 deays.  Pt reports that nothing makles the pain better.  She was taking Roxicet with min relief of the pain- it dulled the pain.  Pt reports tht the pain is positional.  Pt reprts being sexaully active but, has no pain with intercures.  Pt reports monthly cycles that are heavy- very heavy.  She wears 2 pads and a tampon.  Pt also report sthat she occ has bleeding of 2 days direclty after her cycly.  \  Pt reports a h/o cervical cancer in 2013. She had 9 LEEPs for- pt did not have chemo or radiation. Pt was undergoing IVF treatments. Last PAP was 2 years prev.  Pt is still trying to maintain her fertility at present.  Pt on disability for asthma  Past Medical History  Diagnosis Date  . Asthma   . Cancer     cervical  . Anemia   . History of blood transfusion   . Heart valve disorder   . COPD (chronic obstructive pulmonary disease)   . Gastric ulcer     x 5   Past Surgical History  Procedure Laterality Date  . Appendectomy    . Cholecystectomy    . Tonsillectomy    . Cervical cone biopsy      x 6  . Cesarean section      x 2  . Shoulder surgery Left    Current Outpatient Prescriptions on File Prior to Visit  Medication Sig Dispense Refill  . albuterol (PROVENTIL HFA;VENTOLIN HFA) 108 (90 BASE) MCG/ACT inhaler Inhale 2 puffs into the lungs once. 1 Inhaler 1   No current facility-administered medications on file prior to visit.     Review of Systems      Objective:   Physical Exam BP 123/67 mmHg  Pulse 75  Temp(Src) 98.6 F (37 C) (Oral)  Ht 5\' 6"  (1.676 m)  Wt 184 lb 9.6 oz (83.734 kg)  BMI 29.81 kg/m2  LMP 09/20/2014 Pt appears uncomfortable.   Lungs: some exp wheezes CV: RRR Abd; slightly distended; no rebound or guarding  GU: EGBUS: no lesions Vagina: no blood in vault Cervix: no lesion; no mucopurulent d/c Uterus: enlarged 8-10 weeks sized.  tender on palpation.  Fibroids palpated and tender. Adnexa: no masses; non tender     07/19/2014 CLINICAL DATA: Left adnexal pain and tenderness. Evaluate for torsion.  EXAM: TRANSABDOMINAL AND TRANSVAGINAL ULTRASOUND OF PELVIS  DOPPLER ULTRASOUND OF OVARIES  TECHNIQUE: Both transabdominal and transvaginal ultrasound examinations of the pelvis were performed. Transabdominal technique was performed for global imaging of the pelvis including uterus, ovaries, adnexal regions, and pelvic cul-de-sac.  It was necessary to proceed with endovaginal exam following the transabdominal exam to visualize the uterus, endometrium, and ovaries. Color and duplex Doppler ultrasound was utilized to evaluate blood flow to the ovaries.  COMPARISON: CT 06/25/2014. Pelvic ultrasound 04/30/2014  FINDINGS: Uterus  Measurements: Retroverted  and measuring 7.4 x 4.5 x 6.2 cm, measurements likely underestimating the uterine size. This is not included exophytic uterine fibroids and measures 5.0 x 4.9 x 6.1 cm about the fundus. Additional smaller uterine fibroids are seen.  Endometrium  Thickness: 11 mm. The endometrium remains heterogeneous. No focal abnormality visualized.  Right ovary  Measurements: 2.0 x 1.0 x 1.0 cm. Normal appearance/no adnexal mass.  Left ovary  Measurements: 4.8 x 2.3 x 2.6 cm. Normal appearance/no adnexal mass.  Pulsed Doppler evaluation of both ovaries demonstrates normal low-resistance arterial and venous waveforms.  Other findings  Small  volume of free fluid.  IMPRESSION: 1. Normal appearance of both ovaries without torsion. Normal blood flow. 2. Uterine fibroids, largest at the posterior fundus. Heterogeneous endometrium appear similar to prior exams.      Assessment:     Pelvic pain- thought to be due to uterine fibroids.  Pt is undecided currently about maintaining her fertility.  She says that she would rather get rid of the pain than to maintain her fertility at present.  I have reviewed her optuon sbut, she needs an endomtrial bx first.  We need her records from [place] if possible.  She also needs a f/u visit for an endometrial bx to rule out endo ca.  With her prev h/o 'cervical cancer' we need to eval this prior to any further tx  Plan:     F/u for endometrial biopsy. Megace 40 mg daily Ultram 50mg  q 6 hours.#60 Naprosyn 500mg  bid prn F/u Pap with HR HPV and cx done today

## 2014-10-13 ENCOUNTER — Telehealth: Payer: Self-pay

## 2014-10-13 NOTE — Telephone Encounter (Signed)
Patient called team health line and asked if she could be around her daughter who is pregnant since she is on the megace. Provided call back of [phone]. Called patient and she states someone already answered her question. Told patient that Dr Ihor Dow would like her past medical records from her doctors in [place] as that will be helpful in providing her care. Told patient she would need to come by the office during office hours to fill out a release of information. Patient verbalized understanding and states she can come by tomorrow. Patient had no questions

## 2014-10-13 NOTE — Telephone Encounter (Signed)
Attempted to contact patient. Melissa Alvarez "the person you are calling cannot accept calls at this time." Will attempt again at a later time. Patient will need to come in and sign ROI for records.

## 2014-10-13 NOTE — Telephone Encounter (Signed)
-----   Message from Lavonia Drafts, MD sent at 10/12/2014  5:06 PM EDT ----- Please get info from pts re her providers in [place] and attempt to get her past med recs.  Thx, clh-S

## 2014-10-14 ENCOUNTER — Other Ambulatory Visit: Payer: Self-pay | Admitting: Obstetrics & Gynecology

## 2014-10-14 DIAGNOSIS — A599 Trichomoniasis, unspecified: Secondary | ICD-10-CM

## 2014-10-14 MED ORDER — METRONIDAZOLE 500 MG PO TABS: ORAL_TABLET | ORAL | Status: DC

## 2014-10-15 NOTE — Progress Notes (Signed)
Attempted to contact patient. Heard the person you are calling cannot accept calls at this time. Will attempt at a later date.

## 2014-10-19 NOTE — Progress Notes (Addendum)
Attempted to contact patient. Heard again, the person cannot accept incoming calls. Will send certified letter.

## 2014-10-21 ENCOUNTER — Encounter: Payer: Self-pay | Admitting: General Practice

## 2014-11-09 ENCOUNTER — Ambulatory Visit (INDEPENDENT_AMBULATORY_CARE_PROVIDER_SITE_OTHER): Payer: Medicare Other | Admitting: Obstetrics & Gynecology

## 2014-11-09 ENCOUNTER — Encounter: Payer: Self-pay | Admitting: Obstetrics & Gynecology

## 2014-11-09 ENCOUNTER — Other Ambulatory Visit (HOSPITAL_COMMUNITY)
Admission: RE | Admit: 2014-11-09 | Discharge: 2014-11-09 | Disposition: A | Discharge status: Home / Self Care | Payer: Medicare Other | Source: Ambulatory Visit | Attending: Obstetrics & Gynecology | Admitting: Obstetrics & Gynecology

## 2014-11-09 VITALS — BP 136/74 | HR 78 | Temp 99.0°F | Ht 66.0 in | Wt 182.0 lb

## 2014-11-09 DIAGNOSIS — D259 Leiomyoma of uterus, unspecified: Secondary | ICD-10-CM | POA: Diagnosis not present

## 2014-11-09 DIAGNOSIS — C541 Malignant neoplasm of endometrium: Secondary | ICD-10-CM | POA: Diagnosis not present

## 2014-11-09 DIAGNOSIS — N939 Abnormal uterine and vaginal bleeding, unspecified: Secondary | ICD-10-CM | POA: Diagnosis not present

## 2014-11-09 LAB — POCT PREGNANCY, URINE: Preg Test, Ur: NEGATIVE

## 2014-11-09 MED ORDER — OXYCODONE-ACETAMINOPHEN 5-325 MG PO TABS: 1.0000 | ORAL_TABLET | Freq: Four times a day (QID) | ORAL | Status: DC | PRN

## 2014-11-09 NOTE — Patient Instructions (Signed)
Hysterectomy Information  A hysterectomy is a surgery in which your uterus is removed. This surgery may be done to treat various medical problems. After the surgery, you will no longer have menstrual periods. The surgery will also make you unable to become pregnant (sterile). The fallopian tubes and ovaries can be removed (bilateral salpingo-oophorectomy) during this surgery as well.  REASONS FOR A HYSTERECTOMY  Persistent, abnormal bleeding.  Lasting (chronic) pelvic pain or infection.  The lining of the uterus (endometrium) starts growing outside the uterus (endometriosis).  The endometrium starts growing in the muscle of the uterus (adenomyosis).  The uterus falls down into the vagina (pelvic organ prolapse).  Noncancerous growths in the uterus (uterine fibroids) that cause symptoms.  Precancerous cells.  Cervical cancer or uterine cancer. TYPES OF HYSTERECTOMIES  Supracervical hysterectomy--In this type, the top part of the uterus is removed, but not the cervix.  Total hysterectomy--The uterus and cervix are removed.  Radical hysterectomy--The uterus, the cervix, and the fibrous tissue that holds the uterus in place in the pelvis (parametrium) are removed. WAYS A HYSTERECTOMY CAN BE PERFORMED  Abdominal hysterectomy--A large surgical cut (incision) is made in the abdomen. The uterus is removed through this incision.  Vaginal hysterectomy--An incision is made in the vagina. The uterus is removed through this incision. There are no abdominal incisions.  Conventional laparoscopic hysterectomy--Three or four small incisions are made in the abdomen. A thin, lighted tube with a camera (laparoscope) is inserted into one of the incisions. Other tools are put through the other incisions. The uterus is cut into small pieces. The small pieces are removed through the incisions, or they are removed through the vagina.  Laparoscopically assisted vaginal hysterectomy (LAVH)--Three or four  small incisions are made in the abdomen. Part of the surgery is performed laparoscopically and part vaginally. The uterus is removed through the vagina.  Robot-assisted laparoscopic hysterectomy--A laparoscope and other tools are inserted into 3 or 4 small incisions in the abdomen. A computer-controlled device is used to give the surgeon a 3D image and to help control the surgical instruments. This allows for more precise movements of surgical instruments. The uterus is cut into small pieces and removed through the incisions or removed through the vagina. RISKS AND COMPLICATIONS  Possible complications associated with this procedure include:  Bleeding and risk of blood transfusion. Tell your health care provider if you do not want to receive any blood products.  Blood clots in the legs or lung.  Infection.  Injury to surrounding organs.  Problems or side effects related to anesthesia.  Conversion to an abdominal hysterectomy from one of the other techniques. WHAT TO EXPECT AFTER A HYSTERECTOMY  You will be given pain medicine.  You will need to have someone with you for the first 3-5 days after you go home.  You will need to follow up with your surgeon in 2-4 weeks after surgery to evaluate your progress.  You may have early menopause symptoms such as hot flashes, night sweats, and insomnia.  If you had a hysterectomy for a problem that was not cancer or not a condition that could lead to cancer, then you no longer need Pap tests. However, even if you no longer need a Pap test, a regular exam is a good idea to make sure no other problems are starting. Document Released: 12/13/2000 Document Revised: 04/09/2013 Document Reviewed: 02/24/2013 [facility] Patient Information 2015 [place], [place]. This information is not intended to replace advice given to you by your health care  provider. Make sure you discuss any questions you have with your health care provider.

## 2014-11-09 NOTE — Progress Notes (Signed)
Patient ID: Melissa Alvarez, female   DOB: 03/22/1976, 39 y.o.   MRN: [id number] Pt presents  For Endobx.  She reports that her bleeding is controlled with the Megace but the pain is continuing and getting worse.  She reports that the pain is mainly on her right side.  She reports that the current pain meds are not controlling the pain.    Past Surgical History  Procedure Laterality Date  . Appendectomy    . Cholecystectomy    . Tonsillectomy    . Cervical cone biopsy      x 6  . Cesarean section      x 2  . Shoulder surgery Left    Past Medical History  Diagnosis Date  . Asthma   . Cancer     cervical  . Anemia   . History of blood transfusion   . Heart valve disorder   . COPD (chronic obstructive pulmonary disease)   . Gastric ulcer     x 5   Current Outpatient Prescriptions on File Prior to Visit  Medication Sig Dispense Refill  . albuterol (PROVENTIL HFA;VENTOLIN HFA) 108 (90 BASE) MCG/ACT inhaler Inhale 2 puffs into the lungs once. 1 Inhaler 1  . megestrol (MEGACE) 20 MG tablet Take 2 tablets (40 mg total) by mouth daily. 30 tablet 3  . naproxen (NAPROSYN) 500 MG tablet Take 1 tablet (500 mg total) by mouth 2 (two) times daily with a meal. 60 tablet 0  . traMADol (ULTRAM) 50 MG tablet Take 1 tablet (50 mg total) by mouth every 6 (six) hours as needed. 60 tablet 0   No current facility-administered medications on file prior to visit.   PE: Pt appears uncomfortable. NAD  Abd; well healed vertical incision  07/19/2014 CLINICAL DATA: Left adnexal pain and tenderness. Evaluate for torsion.  EXAM: TRANSABDOMINAL AND TRANSVAGINAL ULTRASOUND OF PELVIS  DOPPLER ULTRASOUND OF OVARIES  TECHNIQUE: Both transabdominal and transvaginal ultrasound examinations of the pelvis were performed. Transabdominal technique was performed for global imaging of the pelvis including uterus, ovaries, adnexal regions, and pelvic cul-de-sac.  It was necessary to proceed with  endovaginal exam following the transabdominal exam to visualize the uterus, endometrium, and ovaries. Color and duplex Doppler ultrasound was utilized to evaluate blood flow to the ovaries.  COMPARISON: CT 06/25/2014. Pelvic ultrasound 04/30/2014  FINDINGS: Uterus  Measurements: Retroverted and measuring 7.4 x 4.5 x 6.2 cm, measurements likely underestimating the uterine size. This is not included exophytic uterine fibroids and measures 5.0 x 4.9 x 6.1 cm about the fundus. Additional smaller uterine fibroids are seen.  Endometrium  Thickness: 11 mm. The endometrium remains heterogeneous. No focal abnormality visualized.  Right ovary  Measurements: 2.0 x 1.0 x 1.0 cm. Normal appearance/no adnexal mass.  Left ovary  Measurements: 4.8 x 2.3 x 2.6 cm. Normal appearance/no adnexal mass.  Pulsed Doppler evaluation of both ovaries demonstrates normal low-resistance arterial and venous waveforms.  Other findings  Small volume of free fluid.  IMPRESSION: 1. Normal appearance of both ovaries without torsion. Normal blood flow. 2. Uterine fibroids, largest at the posterior fundus. Heterogeneous endometrium appear similar to prior exams.  The indications for endometrial biopsy were reviewed.   Risks of the biopsy including cramping, bleeding, infection, uterine perforation, inadequate specimen and need for additional procedures  were discussed. The patient states she understands and agrees to undergo procedure today. Consent was signed. Time out was performed. Urine HCG was negative. A sterile speculum was placed in the patient's vagina  and the cervix was prepped with Betadine. A single-toothed tenaculum was placed on the anterior lip of the cervix to stabilize it. The 3 mm pipelle was introduced into the endometrial cavity without difficulty to a depth of 7cm, and a moderate amount of tissue was obtained and sent to pathology. The instruments were removed from the  patient's vagina. Minimal bleeding from the cervix was noted. The patient tolerated the procedure well. Routine post-procedure instructions were given to the patient. The patient will follow up to review the results and for further management.    A/ Pelvic pain/ symptomatic uterine fibroids- pt requests definitive treatment  P/ Patient desires surgical management with [place] with bilateral salpingectomy.  The risks of surgery were discussed in detail with the patient including but not limited to: bleeding which may require transfusion or reoperation; infection which may require prolonged hospitalization or re-hospitalization and antibiotic therapy; injury to bowel, bladder, ureters and major vessels or other surrounding organs; need for additional procedures including laparotomy; thromboembolic phenomenon, incisional problems and other postoperative or anesthesia complications.  Patient was told that the likelihood that her condition and symptoms will be treated effectively with this surgical management was very high; the postoperative expectations were also discussed in detail. The patient also understands the alternative treatment options which were discussed in full. All questions were answered.  She was told that she will be contacted by our surgical scheduler regarding the time and date of her surgery; routine preoperative instructions of having nothing to eat or drink after midnight on the day prior to surgery and also coming to the hospital 1 1/2 hours prior to her time of surgery were also emphasized.  She was told she may be called for a preoperative appointment about a week prior to surgery and will be given further preoperative instructions at that visit. Printed patient education handouts about the procedure were given to the patient to review at home.  Percocet 5mg /325mg  1-2 q 6 hours #30 (pt reports that she has taken this in the past with no problems- he chart documents these Rx) Pt will need  medical clearance prior to surgery as she has Medicare for disability. F/u results of endometrial bioppsy

## 2014-11-11 ENCOUNTER — Telehealth: Payer: Self-pay | Admitting: General Practice

## 2014-11-11 NOTE — Telephone Encounter (Signed)
Called patient, no answer- left message stating we are trying to reach you to set up an appt, please call us back at the clinics

## 2014-11-11 NOTE — Telephone Encounter (Signed)
-----   Message from Lavonia Drafts, MD sent at 11/11/2014  2:43 PM EDT ----- Please call pt.  She needs OV on Thur at 1245 or Friday 0800 am to review pathology.  thx clh-S

## 2014-11-12 NOTE — Telephone Encounter (Signed)
Called patient on [phone]. Spoke with patient.  Agrees to come in tomorrow at 8 am to see Dr. Ihor Dow.

## 2014-11-12 NOTE — Telephone Encounter (Signed)
Called patient and left message to return our call regarding an appointment.  Attempted to contact daughter who is listed as emergency contact.  Phone is not in service.

## 2014-11-13 ENCOUNTER — Ambulatory Visit (INDEPENDENT_AMBULATORY_CARE_PROVIDER_SITE_OTHER): Payer: Medicare Other | Admitting: Obstetrics & Gynecology

## 2014-11-13 ENCOUNTER — Encounter: Payer: Self-pay | Admitting: Obstetrics & Gynecology

## 2014-11-13 DIAGNOSIS — C539 Malignant neoplasm of cervix uteri, unspecified: Secondary | ICD-10-CM | POA: Diagnosis present

## 2014-11-13 LAB — CYTOLOGY - PAP

## 2014-11-13 NOTE — Patient Instructions (Signed)
Cervical Cancer The cervix is the opening and bottom part of the uterus between the vagina and the uterus. Cervical cancer is a fairly common cancer. It occurs most often in women between the ages of 40 years and 55 years. Cells of the cervix act very much like skin cells. These cells are exposed to toxins, viruses, and bacteria that may cause abnormal changes.  There are two kinds of cancers of the cervix:   Squamous cell carcinoma. This type of cancer starts in the flat or scale-like cells that line the cervix. Squamous cell carcinoma can develop from a sexually transmitted infection caused by the human papillomavirus (HPV).  Adenocarcinoma. This type of cervical cancer starts in glandular cells that line the cervix. RISK FACTORS The risk of getting cancer of the cervix is related to your lifestyle, sexual history, health, and immune system. Risks for cervical cancer include:   Having a sexually transmitted viral infection. These include:  Chlamydia.   Herpes.   HPV.  Becoming sexually active before age 18 years.  Having more than one sexual partner or having sex with someone who has more than one sexual partner.  Not using condoms with sexual partners.  Having had cancer of the vagina or vulva.  Having a sexual partner who has or had cancer of the penis or who has had a sexual partner with abnormal cervical cells (dysplasia) or cervical cancer.  Using oral contraceptives (also called birth control pills).  Smoking.   Having a weakened immune system. For example, human immunodeficiency virus (HIV) or other immune deficiency disorders.  Being the daughter of a woman who took diethylstilbestrol (DES) during pregnancy.  Having a sister or mother who has had cancer of the cervix.  Being African American, Hispanic, Asian, or a woman from the [place].  A history of dysplasia of the cervix. SIGNS AND SYMPTOMS  Symptoms are usually not present in the early stages of  cervical cancer. Once the cancer invades the cervix and surrounding tissues, the woman may have:   Abnormal vaginal bleeding or menstrual bleeding that is longer or heavier than usual.  Bleeding after intercourse, douching, or a Pap test.  Vaginal bleeding following menopause.  Abnormal vaginal discharge.  Pelvic discomfort or pain.  An abnormal Pap test.  Pain during sexual intercourse. Symptoms of more advanced cervical cancer may include:   Loss of appetite or weight loss.  Tiredness (fatigue).  Back and leg pain.  Inability to control urination or bowel movements. DIAGNOSIS  A pelvic exam and Pap test are done to diagnose the condition. If abnormalities are found during the exam or Pap test, the Pap test may be repeated in 3 months, or your health care provider may do additional tests or procedures, such as:   A colposcopy. This is a procedure that uses a special microscope that allows the health care provider to magnify and closely examine the cells of the cervix, vagina, and vulva.  Cervical biopsies. This is a procedure where small tissue samples are taken from the cervix to be examined under a microscope by a specialist.   A cone biopsy. This is a procedure to test for or remove cancerous tissue. Other tests may be needed, including:   Cystoscopy.   Proctoscopy or sigmoidoscopy.  Ultrasound.   CT scan.   MRI.   Laparoscopy.  There are different stages of cervical cancer:   Stage 0, carcinoma in situ (CIS)--This first stage of cancer is the last and most serious stage of   Proctoscopy or sigmoidoscopy.  · Ultrasound.    · CT scan.    · MRI.    · Laparoscopy.    There are different stages of cervical cancer:   · Stage 0, carcinoma in situ (CIS)--This first stage of cancer is the last and most serious stage of dysplasia.  · Stage I--This means the tumor is in the uterus and cervix only.  · Stage II--This means the tumor has spread to the upper vagina. The cancer has spread beyond the uterus but not to the pelvic walls or lower third of the vagina.  · Stage III--This means the tumor has invaded the side wall of the pelvis and the lower third of the vagina. If the tumor blocks the tubes that  carry urine to the bladder (ureters), it may cause urine to back up and the kidneys to swell (hydronephrosis).  · Stage IV--This means the tumor has spread to the rectum or bladder. In the later part of this stage, it has also spread to distant organs, like the lungs.  TREATMENT   Treatment options can include:   · Cone biopsy to remove the cancerous tissue.    · Removal of the entire uterus and cervix.    · Removal of the uterus, cervix, upper vagina, lymph nodes, and surrounding tissue (modified radical hysterectomy). The ovaries may be left in place or removed.  · Medicines to treat cancer.    · A combination of surgery, radiation, and chemotherapy.    · Biological response modifiers. These are substances that help strengthen your immune system's fight against cancer or infection. They may be used in combination with chemotherapy.  HOME CARE INSTRUCTIONS   · Get a gynecology exam and Pap test once every year or as directed by your health care provider.    · Get the HPV vaccine.    · Do not smoke.  · Do not have sexual intercourse until your health care provider says it is okay.  · Use a condom every time you have sex.   SEEK MEDICAL CARE IF:   · You have increased pelvic pain or pressure.    · Your are becoming increasingly tired.    · You have increased leg or back pain.    · You have a fever.  · You have abnormal bleeding or discharge.  · You lose weight.  SEEK IMMEDIATE MEDICAL CARE IF:   · You cannot urinate.  · You have blood in your urine.    · You have blood or pressure with a bowel movement.    · You develop severe back, stomach, or pelvic pain.  Document Released: 06/19/2005 Document Revised: 06/24/2013 Document Reviewed: 12/11/2012  ExitCare® Patient Information ©2015 [employer]. This information is not intended to replace advice given to you by your health care provider. Make sure you discuss any questions you have with your health care provider.

## 2014-11-13 NOTE — Progress Notes (Signed)
Subjective:     Patient ID: Melissa Alvarez, female   DOB: 02/23/1976, 39 y.o.   MRN: [id number]  HPIPt presents to review results of Endo bx.  On history pt reports that she had a PAP that was abnormal 2-3 years ago in [place], [place].  She reports that she had a colpo with bx and a LEEP.  Her next f/u after that ws her PAP in April 2016 (resutls below).     Review of Systems     Objective:   Physical ExamPt in NAD  Exam deferred  10/12/2014 Adequacy Reason Satisfactory for evaluation, endocervical/transformation zone component PRESENT. Diagnosis NEGATIVE FOR INTRAEPITHELIAL LESIONS OR MALIGNANCY. BENIGN REACTIVE/REPARATIVE CHANGES. TRICHOMONAS VAGINALIS PRESENT. +hrHPV  11/09/2014 Diagnosis Endometrium, biopsy - FRAGMENTS OF SQUAMOUS CELL CARCINOMA. - ENDOMETRIOID-TYPE POLYP WITH COMPLEX HYPERPLASIA WITHOUT ATYPIA. - DECIDUALIZATION OF THE STROMA, CONSISTENT WITH HORMONE EFFECT. 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  It is most likely that these cells are arising from the cervix especially given pts history DESPITE the normal PAP in April.  The April PAP is being reviewed.  Pt is aware that her surgery with [place] will be cancelled and further eval and treatment for this problem will be done by GYN ONC.    All of her questions were answered.    Plan:     I have called Pathology and her PAP is being reviewed Pt has been referred to [facility]- has appt for Tues May 17th I spent 25 min with pt in face to face conversation re pathology and expectations of eval and treatment

## 2014-11-16 ENCOUNTER — Ambulatory Visit: Payer: Medicare Other | Attending: Gynecology | Admitting: Gynecology

## 2014-11-18 ENCOUNTER — Encounter (HOSPITAL_COMMUNITY): Payer: Self-pay | Admitting: Emergency Medicine

## 2014-11-18 ENCOUNTER — Emergency Department (HOSPITAL_COMMUNITY)
Admission: EM | Admit: 2014-11-18 | Discharge: 2014-11-19 | Disposition: A | Discharge status: Home / Self Care | Payer: Medicare Other | Attending: Emergency Medicine | Admitting: Emergency Medicine

## 2014-11-18 DIAGNOSIS — K760 Fatty (change of) liver, not elsewhere classified: Secondary | ICD-10-CM | POA: Diagnosis not present

## 2014-11-18 DIAGNOSIS — Z791 Long term (current) use of non-steroidal anti-inflammatories (NSAID): Secondary | ICD-10-CM | POA: Diagnosis not present

## 2014-11-18 DIAGNOSIS — R1030 Lower abdominal pain, unspecified: Secondary | ICD-10-CM | POA: Diagnosis present

## 2014-11-18 DIAGNOSIS — J449 Chronic obstructive pulmonary disease, unspecified: Secondary | ICD-10-CM | POA: Insufficient documentation

## 2014-11-18 DIAGNOSIS — Z8719 Personal history of other diseases of the digestive system: Secondary | ICD-10-CM | POA: Insufficient documentation

## 2014-11-18 DIAGNOSIS — Z880 Allergy status to penicillin: Secondary | ICD-10-CM | POA: Diagnosis not present

## 2014-11-18 DIAGNOSIS — Z3202 Encounter for pregnancy test, result negative: Secondary | ICD-10-CM | POA: Diagnosis not present

## 2014-11-18 DIAGNOSIS — Z79899 Other long term (current) drug therapy: Secondary | ICD-10-CM | POA: Diagnosis not present

## 2014-11-18 DIAGNOSIS — Z720 Tobacco use: Secondary | ICD-10-CM | POA: Insufficient documentation

## 2014-11-18 DIAGNOSIS — Z862 Personal history of diseases of the blood and blood-forming organs and certain disorders involving the immune mechanism: Secondary | ICD-10-CM | POA: Diagnosis not present

## 2014-11-18 DIAGNOSIS — D259 Leiomyoma of uterus, unspecified: Secondary | ICD-10-CM | POA: Diagnosis not present

## 2014-11-18 DIAGNOSIS — Z8679 Personal history of other diseases of the circulatory system: Secondary | ICD-10-CM | POA: Diagnosis not present

## 2014-11-18 DIAGNOSIS — Z9889 Other specified postprocedural states: Secondary | ICD-10-CM | POA: Diagnosis not present

## 2014-11-18 DIAGNOSIS — Z8541 Personal history of malignant neoplasm of cervix uteri: Secondary | ICD-10-CM | POA: Insufficient documentation

## 2014-11-18 NOTE — ED Notes (Signed)
Pt states that she has had heavy vaginal bleeding and abdominal pain x 5days. Hx of cervical CA. Alert and oriented.

## 2014-11-19 ENCOUNTER — Emergency Department (HOSPITAL_COMMUNITY): Payer: Medicare Other

## 2014-11-19 DIAGNOSIS — D259 Leiomyoma of uterus, unspecified: Secondary | ICD-10-CM | POA: Diagnosis not present

## 2014-11-19 DIAGNOSIS — K760 Fatty (change of) liver, not elsewhere classified: Secondary | ICD-10-CM | POA: Diagnosis not present

## 2014-11-19 DIAGNOSIS — Z9889 Other specified postprocedural states: Secondary | ICD-10-CM | POA: Diagnosis not present

## 2014-11-19 LAB — URINALYSIS, ROUTINE W REFLEX MICROSCOPIC
Bilirubin Urine: NEGATIVE
Glucose, UA: NEGATIVE mg/dL
Ketones, ur: NEGATIVE mg/dL
Nitrite: NEGATIVE
Protein, ur: NEGATIVE mg/dL
Specific Gravity, Urine: 1.026 (ref 1.005–1.030)
Urobilinogen, UA: 1 mg/dL (ref 0.0–1.0)
pH: 7 (ref 5.0–8.0)

## 2014-11-19 LAB — URINE MICROSCOPIC-ADD ON

## 2014-11-19 LAB — CBC WITH DIFFERENTIAL/PLATELET
Basophils Absolute: 0 10*3/uL (ref 0.0–0.1)
Basophils Relative: 0 % (ref 0–1)
Eosinophils Absolute: 0.2 10*3/uL (ref 0.0–0.7)
Eosinophils Relative: 2 % (ref 0–5)
HCT: 31.5 % — ABNORMAL LOW (ref 36.0–46.0)
Hemoglobin: 9 g/dL — ABNORMAL LOW (ref 12.0–15.0)
Lymphocytes Relative: 37 % (ref 12–46)
Lymphs Abs: 3.6 10*3/uL (ref 0.7–4.0)
MCH: 18 pg — ABNORMAL LOW (ref 26.0–34.0)
MCHC: 28.6 g/dL — ABNORMAL LOW (ref 30.0–36.0)
MCV: 63 fL — ABNORMAL LOW (ref 78.0–100.0)
Monocytes Absolute: 0.8 10*3/uL (ref 0.1–1.0)
Monocytes Relative: 8 % (ref 3–12)
Neutro Abs: 5.2 10*3/uL (ref 1.7–7.7)
Neutrophils Relative %: 53 % (ref 43–77)
Platelets: 251 10*3/uL (ref 150–400)
RBC: 5 MIL/uL (ref 3.87–5.11)
RDW: 20.4 % — ABNORMAL HIGH (ref 11.5–15.5)
WBC: 9.8 10*3/uL (ref 4.0–10.5)

## 2014-11-19 LAB — POC URINE PREG, ED: Preg Test, Ur: NEGATIVE

## 2014-11-19 MED ORDER — ONDANSETRON 8 MG PO TBDP
8.0000 mg | ORAL_TABLET | Freq: Once | ORAL | Status: AC
  Administered 2014-11-19: 8 mg via ORAL
  Filled 2014-11-19: qty 1

## 2014-11-19 MED ORDER — HYDROMORPHONE HCL 2 MG/ML IJ SOLN
2.0000 mg | Freq: Once | INTRAMUSCULAR | Status: AC
  Administered 2014-11-19: 2 mg via INTRAMUSCULAR
  Filled 2014-11-19: qty 1

## 2014-11-19 MED ORDER — OXYCODONE-ACETAMINOPHEN 5-325 MG PO TABS
1.0000 | ORAL_TABLET | Freq: Once | ORAL | Status: AC
  Administered 2014-11-19: 1 via ORAL
  Filled 2014-11-19: qty 1

## 2014-11-19 MED ORDER — OXYCODONE-ACETAMINOPHEN 5-325 MG PO TABS: 1.0000 | ORAL_TABLET | ORAL | Status: DC | PRN

## 2014-11-19 NOTE — ED Provider Notes (Signed)
1:15 AM: At end of shift, hand-off report received from [facility], PA-C.  Plan includes follow-up and re-eval after [place] results.  If negative, Melissa Alvarez may be discharged to follow-up with her already scheduled OB/GYN visit.  Melissa Alvarez resting without distress currently.   2:30 AM: [place] reviewed, no significant abnormalities.  Discussed results with Melissa Alvarez and instructed to follow-up as originally planned. Melissa Alvarez is well-appearing, in no acute distress and vital signs reviewed and not concerning.  She appears safe to be discharged.  Return precautions provided. Melissa Alvarez aware of plan and in agreement.    Filed Vitals:   11/18/14 2243 11/19/14 0137  BP: 132/99 117/61  Pulse: 100 78  Temp: 98.5 F (36.9 C)   TempSrc: Oral   Resp: 16 16  SpO2: 100% 100%   Meds given in ED:  Medications  HYDROmorphone (DILAUDID) injection 2 mg (2 mg Intramuscular Given 11/19/14 0005)  ondansetron (ZOFRAN-ODT) disintegrating tablet 8 mg (8 mg Oral Given 11/19/14 0040)  oxyCODONE-acetaminophen (PERCOCET/ROXICET) 5-325 MG per tablet 1 tablet (1 tablet Oral Given 11/19/14 0233)    Discharge Medication List as of 11/19/2014  1:55 AM     Results for orders placed or performed during the hospital encounter of 11/18/14  CBC with Differential  Result Value Ref Range   WBC 9.8 4.0 - 10.5 K/uL   RBC 5.00 3.87 - 5.11 MIL/uL   Hemoglobin 9.0 (L) 12.0 - 15.0 g/dL   HCT 31.5 (L) 36.0 - 46.0 %   MCV 63.0 (L) 78.0 - 100.0 fL   MCH 18.0 (L) 26.0 - 34.0 pg   MCHC 28.6 (L) 30.0 - 36.0 g/dL   RDW 20.4 (H) 11.5 - 15.5 %   Platelets 251 150 - 400 K/uL   Neutrophils Relative % 53 43 - 77 %   Lymphocytes Relative 37 12 - 46 %   Monocytes Relative 8 3 - 12 %   Eosinophils Relative 2 0 - 5 %   Basophils Relative 0 0 - 1 %   Neutro Abs 5.2 1.7 - 7.7 K/uL   Lymphs Abs 3.6 0.7 - 4.0 K/uL   Monocytes Absolute 0.8 0.1 - 1.0 K/uL   Eosinophils Absolute 0.2 0.0 - 0.7 K/uL   Basophils Absolute 0.0 0.0 - 0.1 K/uL   RBC Morphology POLYCHROMASIA PRESENT    Urinalysis, Routine w reflex microscopic  Result Value Ref Range   Color, Urine YELLOW YELLOW   APPearance CLOUDY (A) CLEAR   Specific Gravity, Urine 1.026 1.005 - 1.030   pH 7.0 5.0 - 8.0   Glucose, UA NEGATIVE NEGATIVE mg/dL   Hgb urine dipstick SMALL (A) NEGATIVE   Bilirubin Urine NEGATIVE NEGATIVE   Ketones, ur NEGATIVE NEGATIVE mg/dL   Protein, ur NEGATIVE NEGATIVE mg/dL   Urobilinogen, UA 1.0 0.0 - 1.0 mg/dL   Nitrite NEGATIVE NEGATIVE   Leukocytes, UA TRACE (A) NEGATIVE  Urine microscopic-add on  Result Value Ref Range   Squamous Epithelial / LPF RARE RARE   WBC, UA 3-6 <3 WBC/hpf   RBC / HPF 3-6 <3 RBC/hpf   Bacteria, UA MANY (A) RARE  POC Urine Pregnancy, ED (do NOT order at [facility])  Result Value Ref Range   Preg Test, Ur NEGATIVE NEGATIVE   US Abdomen Complete  11/19/2014   CLINICAL DATA:  Acute onset of epigastric abdominal pain. Initial encounter.  EXAM: ULTRASOUND ABDOMEN COMPLETE  COMPARISON:  CT of the abdomen and pelvis from 06/25/2014  FINDINGS: Gallbladder: Status post cholecystectomy.  No retained stones seen.  Common bile duct: Diameter: 0.5 cm, within normal limits in caliber.  Liver: No focal lesion identified. Mildly increased parenchymal echogenicity likely reflects fatty infiltration.  IVC: No abnormality visualized.  Pancreas: Visualized portion unremarkable.  Spleen: Size and appearance within normal limits.  Right Kidney: Length: 11.0 cm. Echogenicity within normal limits. No mass or hydronephrosis visualized.  Left Kidney: Length: 11.6 cm. Echogenicity within normal limits. Partially obscured by overlying bowel gas. No mass or hydronephrosis visualized.  Abdominal aorta: No aneurysm visualized.  Other findings: None.  IMPRESSION: 1. No acute abnormality seen within the abdomen. 2. Status post cholecystectomy; no retained stones seen. 3. Mild fatty infiltration within the liver.   Electronically Signed   By: Garald Balding M.D.   On: 11/19/2014 02:25       Britt Bottom, NP 11/20/14 0263  Julianne Rice, MD 11/20/14 [phone]

## 2014-11-19 NOTE — ED Notes (Signed)
US at bedside

## 2014-11-19 NOTE — ED Provider Notes (Signed)
CSN: [id number]     Arrival date & time 11/18/14  2232 History   First MD Initiated Contact with Patient 11/18/14 2324     Chief Complaint  Patient presents with  . Abdominal Pain  . Vaginal Bleeding   HPI    38 year old female G2P2002 with a significant history of uterine fibroids and squamous cell carcinoma noted on endobx. presents today with vaginal bleeding and abdominal pain. Pt reports a h/o cervical cancer in 2013. She had 9 LEEPs, pt did not have chemo or radiation. Pt was undergoing IVF treatments. Last PAP was 2 years prev.Pt presented to Lavonia Drafts, MD for hysterectomy for her worsening fibroids. She was found to have squamous cell carcinoma before the procedure, with subsequent surgical cancellation. Patient was rescheduled with GYN oncology for further evaluation and management, this was scheduled for 11/17/2014 (yesterday) but due to scheduling difficulties  patient missed this appointment. She reports that 5 days ago she began to have lower abdominal pain similar to presentations with uterine fibroids, with the addition of vaginal bleeding 2 days ago. Patient reports she was seen by Dr. Tamala Julian 6 days ago and reported the abdominal pain, she was given Percocet for this as this was likely related to her uterine fibroids/cancer. Patient reports she has soaked 8 pads over the last 2 days. Patient reports lower abdominal pain, described as "heaviness", with some radiation into the right hip. Patient denies trauma to the abdomen.   Past Medical History  Diagnosis Date  . Asthma   . Cancer     cervical  . Anemia   . History of blood transfusion   . Heart valve disorder   . COPD (chronic obstructive pulmonary disease)   . Gastric ulcer     x 5   Past Surgical History  Procedure Laterality Date  . Appendectomy    . Cholecystectomy    . Tonsillectomy    . Cervical cone biopsy      x 6  . Cesarean section      x 2  . Shoulder surgery Left    Family History   Problem Relation Age of Onset  . Cancer Mother   . Hypertension Mother   . Diabetes Mother   . CAD Mother   . Diabetes Other   . Hypertension Other   . Cancer Other   . CAD Other    History  Substance Use Topics  . Smoking status: Current Every Day Smoker  . Smokeless tobacco: Not on file  . Alcohol Use: Yes     Comment: occasional   OB History    Gravida Para Term Preterm AB TAB SAB Ectopic Multiple Living   2 2 2             Review of Systems  All other systems reviewed and are negative.   Allergies  Acetaminophen; Other; Penicillins; Doxycycline; Erythromycin; and Motrin  Home Medications   Prior to Admission medications   Medication Sig Start Date End Date Taking? Authorizing Provider  albuterol (PROVENTIL HFA;VENTOLIN HFA) 108 (90 BASE) MCG/ACT inhaler Inhale 2 puffs into the lungs once. 06/25/14  Yes Linton Flemings, MD  megestrol (MEGACE) 20 MG tablet Take 2 tablets (40 mg total) by mouth daily. 10/12/14  Yes Lavonia Drafts, MD  naproxen (NAPROSYN) 500 MG tablet Take 1 tablet (500 mg total) by mouth 2 (two) times daily with a meal. 10/12/14  Yes Lavonia Drafts, MD  traMADol (ULTRAM) 50 MG tablet Take 1 tablet (50 mg total) by mouth  every 6 (six) hours as needed. Patient taking differently: Take 50 mg by mouth every 6 (six) hours as needed for moderate pain.  10/12/14  Yes Lavonia Drafts, MD  oxyCODONE-acetaminophen (PERCOCET/ROXICET) 5-325 MG per tablet Take 1-2 tablets by mouth every 6 (six) hours as needed. Patient not taking: Reported on 11/18/2014 11/09/14   Lavonia Drafts, MD   BP 132/99 mmHg  Pulse 100  Temp(Src) 98.5 F (36.9 C) (Oral)  Resp 16  SpO2 100%  LMP 11/16/2014   Physical Exam  Constitutional: She is oriented to person, place, and time. She appears well-developed and well-nourished.  HENT:  Head: Normocephalic and atraumatic.  Eyes: Pupils are equal, round, and reactive to light.  Neck: Normal range of motion. Neck  supple. No JVD present. No tracheal deviation present. No thyromegaly present.  Cardiovascular: Regular rhythm, normal heart sounds and intact distal pulses.  Exam reveals no gallop and no friction rub.   No murmur heard. Pulmonary/Chest: Effort normal and breath sounds normal. No stridor. No respiratory distress. She has no wheezes. She has no rales. She exhibits no tenderness.  Abdominal: Soft. There is no rigidity, no rebound, no guarding, no CVA tenderness, no tenderness at McBurney's point and negative Murphy's sign.  Mild lower abdominal tenderness.   Musculoskeletal: Normal range of motion.  Lymphadenopathy:    She has no cervical adenopathy.  Neurological: She is alert and oriented to person, place, and time. Coordination normal.  Skin: Skin is warm and dry.  Psychiatric: She has a normal mood and affect. Her behavior is normal. Judgment and thought content normal.  Nursing note and vitals reviewed.   ED Course  Procedures (including critical care time) Labs Review Labs Reviewed  CBC WITH DIFFERENTIAL/PLATELET  URINALYSIS, ROUTINE W REFLEX MICROSCOPIC  POC URINE PREG, ED    Imaging Review No results found.   EKG Interpretation None      MDM   Final diagnoses:  Lower abdominal pain  Uterine leiomyoma, unspecified location   Labs: CBC, urine break, urinalysis- hemoglobin 9 normal for patient  Imaging: Abdominal ultrasound complete  Consults: none  Therapeutics: Zofran, Dilaudid  Assessment: lower abdominal pain, uterine fibroids  Plan: Patient presents with lower abdominal pain, history uterine fibroids, history of cervical cancer, and vaginal bleeding. On exam patient has a retroverted uterus making evaluation of cervix difficult, she had minimal blood in her vaginal vault, no signs of active bleeding noted, CBC normal per patient. Patient's pain was managed in the ED with Dilaudid, and Zofran. Patient has close contact with both her primary care provider and  her OB/GYN, today's presentation likely represents uterine fibroids with questionable cervical/uterine cancer. Due to patient's pain being managed, no active signs of bleeding, patient likely would be discharged home with pain medication and close follow-up with OB/GYN.  Abdominal ultrasound was ordered to rule out any further causes of abdominal pain that require further evaluation and management. Britt Bottom NP was informed of the patient I'll relevant information with instructions to discharge home pending ultrasound. Patient was given strict return precautions the event new or worsening signs or symptoms, she was instructed follow-up immediately with her OB/GYN tomorrow for further evaluation and management. Patient verbalized her understanding and agreement for today's plan     Okey Regal, PA-C 11/20/14 0207  Julianne Rice, MD 11/20/14 [phone]

## 2014-11-19 NOTE — Discharge Instructions (Signed)
Please follow-up with your OB/GYN tomorrow morning first thing, please use Percocet as needed for pain, monitor for new or worsening signs or symptoms. Symptoms worsen please return for further evaluation and management.

## 2014-11-20 ENCOUNTER — Encounter: Payer: Self-pay | Admitting: General Practice

## 2014-11-24 ENCOUNTER — Encounter: Payer: Self-pay | Admitting: Gynecologic Oncology

## 2014-11-24 ENCOUNTER — Ambulatory Visit: Payer: Medicare Other | Attending: Gynecologic Oncology | Admitting: Gynecologic Oncology

## 2014-11-24 VITALS — BP 114/75 | HR 74 | Temp 98.3°F | Resp 20 | Ht 66.0 in | Wt 181.6 lb

## 2014-11-24 DIAGNOSIS — Z87410 Personal history of cervical dysplasia: Secondary | ICD-10-CM

## 2014-11-24 DIAGNOSIS — J45909 Unspecified asthma, uncomplicated: Secondary | ICD-10-CM | POA: Insufficient documentation

## 2014-11-24 DIAGNOSIS — D259 Leiomyoma of uterus, unspecified: Secondary | ICD-10-CM

## 2014-11-24 DIAGNOSIS — Z79818 Long term (current) use of other agents affecting estrogen receptors and estrogen levels: Secondary | ICD-10-CM | POA: Insufficient documentation

## 2014-11-24 DIAGNOSIS — D649 Anemia, unspecified: Secondary | ICD-10-CM | POA: Diagnosis not present

## 2014-11-24 DIAGNOSIS — F17200 Nicotine dependence, unspecified, uncomplicated: Secondary | ICD-10-CM | POA: Insufficient documentation

## 2014-11-24 DIAGNOSIS — R87810 Cervical high risk human papillomavirus (HPV) DNA test positive: Secondary | ICD-10-CM

## 2014-11-24 DIAGNOSIS — Z791 Long term (current) use of non-steroidal anti-inflammatories (NSAID): Secondary | ICD-10-CM | POA: Insufficient documentation

## 2014-11-24 DIAGNOSIS — J449 Chronic obstructive pulmonary disease, unspecified: Secondary | ICD-10-CM | POA: Diagnosis not present

## 2014-11-24 DIAGNOSIS — C539 Malignant neoplasm of cervix uteri, unspecified: Secondary | ICD-10-CM | POA: Insufficient documentation

## 2014-11-24 MED ORDER — OXYCODONE-ACETAMINOPHEN 5-325 MG PO TABS: 1.0000 | ORAL_TABLET | ORAL | Status: DC | PRN

## 2014-11-24 NOTE — Patient Instructions (Addendum)
You are scheduled for MRI of the Pelvis at [facility] on June 2 at 9:00 PM. Please arrive at 8:45 PM. Do not eat or drink after 4:45 that day. You may resume eating and drinking immediately after your scan. You will have an IV placed to receive contrast dye for the scan and then it will be removed.   You are also scheduled for a Cold Knife Cone Biopsy on June 7 at 1:15 PM. You will receive a call from the facility with further instructions.   We will call you with the results of your MRI scan and your biopsy.

## 2014-11-24 NOTE — Progress Notes (Signed)
Consult Note: Gyn-Onc  Consult was requested by Dr. Ihor Dow for the evaluation of Melissa Alvarez 39 y.o. female with suspected stage IA cervical cancer and a fibroid uterus.  CC:  Chief Complaint  Patient presents with  . Cervical Cancer    Assessment/Plan:  Melissa Alvarez  is a 39 y.o.  year old with what appears to be a stage IA cervical squamous cell carcinoma, in the setting of anemia and a symptomatic fibroid uterus.  I performed a history, physical examination, and personally reviewed the patient's imaging films including the ultrasound from January, 2016 and the CT abdo/pelvis from December 2015.  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 Therefore I agree with Dr Vladimir Creeks assessment that this is most likely cervical in origin despite it appearing on the endometrial biopsy.  In order to establish the true stage of this lesion, I'm recommending first proceeding with an MRI of the pelvis to assess for an occult endocervical mass, followed by a cold conization of the cervix to discriminate microscopic size parameters that would differentiate a IA1 versus IA2 lesion with subsequent determination of the best classification of hysterectomy to perform thereafter (extrafascial hysterectomy versus radical hysterectomy).  She has chronic anemia. She will require being type and screened for her CKC and type and crossed, and possibly transfused preoperatively before her hysterectomy.  HPI: Melissa Alvarez is a 39 year old parous woman who is seen in consultation at the request of Dr Ihor Dow for squamous cell carcinoma on endometrial biopsy.   The patient reports a long-standing history of cervical dysplasia and excisional procedures. This began when she was 39 years of age. She's had approximate 607 LEEP or cold left  conization's since age 39. The most recent was in 2012 in [place]. She is unclear what the final pathology on margin status was from the procedure. 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 She denies HIV infection. Her most recent Pap was performed here in [place] on 10/12/2014 and was negative for dysplasia on cytology however high risk HPV was detected she was desiring hysterectomy for menorrhagia which she's had for approximately 18 years since the birth of her second daughter. His been attributed to fibroid uterus. She also has pelvic pain.   As part of the workup for her planned hysterectomy the patient underwent an endometrial pelvic. This was largely because her endometrial stripe was thickened at 11 millimeters on ultrasound. This biopsy was performed on 11/09/2014. It demonstrated fragments of squamous cell carcinoma, and an endometrioid-type polyp with complex hyperplasia without atypia. She is being treated with Megace therapy for her hyperplasia and abnormal bleeding.  She also has a long-standing history of chronic anemia. In [place] she used to receive blood transfusions approximately every 3-6 months. She denies knowing an etiology for her anemia. She denies having history of sickle cell disease or thalassemia.  She is a long-standing tobacco user, and has resultant COPD and asthma.   Current Meds:  Outpatient Encounter Prescriptions as of 11/24/2014  Medication Sig  . megestrol (MEGACE) 20 MG tablet Take 2 tablets (40 mg total) by mouth daily.  . naproxen (NAPROSYN) 500 MG tablet Take 1 tablet (500 mg total) by mouth 2 (two) times daily with a meal.  . oxyCODONE-acetaminophen (PERCOCET/ROXICET) 5-325 MG per tablet Take 1-2 tablets by mouth every 4 (four) hours as needed  for severe pain.  . traMADol (ULTRAM) 50 MG tablet Take 1 tablet (50 mg total) by mouth every 6 (six) hours as needed. (Patient taking differently: Take 50 mg by mouth every 6 (six) hours as needed for  moderate pain. )  . [DISCONTINUED] oxyCODONE-acetaminophen (PERCOCET/ROXICET) 5-325 MG per tablet Take 1-2 tablets by mouth every 4 (four) hours as needed for severe pain.  Marland Kitchen albuterol (PROVENTIL HFA;VENTOLIN HFA) 108 (90 BASE) MCG/ACT inhaler Inhale 2 puffs into the lungs once. (Patient not taking: Reported on 11/24/2014)   No facility-administered encounter medications on file as of 11/24/2014.    Allergy:  Allergies  Allergen Reactions  . Acetaminophen Hives    With codeine   . Other Hives    Clinical steroids except for solu medrol  . Penicillins Hives, Itching and Rash  . Doxycycline Hives, Itching and Rash  . Erythromycin Hives, Itching and Rash  . Motrin [Ibuprofen] Hives, Itching and Rash    Social Hx:   History   Social History  . Marital Status: Single    Spouse Name: N/A  . Number of Children: N/A  . Years of Education: N/A   Occupational History  . Not on file.   Social History Main Topics  . Smoking status: Current Every Day Smoker -- 3.00 packs/day for 24 years  . Smokeless tobacco: Not on file  . Alcohol Use: Yes     Comment: occasional  . Drug Use: Yes    Special: Marijuana     Comment: occasional, situational, to relieve stress  . Sexual Activity: Not on file   Other Topics Concern  . Not on file   Social History Narrative    Past Surgical Hx:  Past Surgical History  Procedure Laterality Date  . Appendectomy    . Cholecystectomy    . Tonsillectomy    . Cervical cone biopsy      x 6  . Cesarean section      x 2  . Shoulder surgery Left     Past Medical Hx:  Past Medical History  Diagnosis Date  . Asthma   . Cancer     cervical  . Anemia   . History of blood transfusion   . Heart valve disorder   . COPD (chronic obstructive pulmonary disease)   . Gastric ulcer     x 5    Past Gynecological History:  C/s x 2. Longstanding history of cervical dysplasia and 7 excisional procedures since age 61.  Patient's last menstrual period was  11/16/2014.  Family Hx:  Family History  Problem Relation Age of Onset  . Cancer Mother   . Hypertension Mother   . Diabetes Mother   . CAD Mother   . Diabetes Other   . Hypertension Other   . Cancer Other   . CAD Other     Review of Systems:  Constitutional  Feels well,    ENT Normal appearing ears and nares bilaterally Skin/Breast  No rash, sores, jaundice, itching, dryness Cardiovascular  No chest pain, shortness of breath, or edema  Pulmonary  No cough or wheeze.  Gastro Intestinal  No nausea, vomitting, or diarrhoea. No bright red blood per rectum, + abdominal pain, change in bowel movement, or constipation.  Genito Urinary  No frequency, urgency, dysuria, + pelvic pain Musculo Skeletal  No myalgia, arthralgia, joint swelling or pain  Neurologic  No weakness, numbness, change in gait,  Psychology  No depression, anxiety, insomnia.   Vitals:  Blood pressure 114/75,  pulse 74, temperature 98.3 F (36.8 C), temperature source Oral, resp. rate 20, height 5\' 6"  (1.676 m), weight 181 lb 9.6 oz (82.373 kg), last menstrual period 11/16/2014.  Physical Exam: WD in NAD Neck  Supple NROM, without any enlargements.  Lymph Node Survey No cervical supraclavicular or inguinal adenopathy Cardiovascular  Pulse normal rate, regularity and rhythm. S1 and S2 normal.  Lungs  Clear to auscultation bilateraly, without wheezes/crackles/rhonchi. Good air movement.  Skin  No rash/lesions/breakdown  Psychiatry  Alert and oriented to person, place, and time  Abdomen  Normoactive bowel sounds, abdomen soft, non-tender and overweight without evidence of hernia.  Back No CVA tenderness Genito Urinary  Vulva/vagina: Normal external female genitalia.  No lesions. No discharge or bleeding.  Bladder/urethra:  No lesions or masses, well supported bladder  Vagina: Normal in appearance  Cervix: Normal appearing, no lesions. Displaced anteriorally by posterior uterine fibroid.   Uterus:  Bulky 16 week size fibroid uterus with dominant posterior fibroid. No parametrial nodularity.  Adnexa: no discrete palpable masses. Rectal  Good tone, no masses no cul de sac nodularity.  Extremities  No bilateral cyanosis, clubbing or edema.  [name], MD   11/24/2014, 3:30 PM

## 2014-12-03 ENCOUNTER — Ambulatory Visit (HOSPITAL_COMMUNITY)
Admission: RE | Admit: 2014-12-03 | Discharge: 2014-12-03 | Disposition: A | Discharge status: Home / Self Care | Payer: Medicare Other | Source: Ambulatory Visit | Attending: Gynecologic Oncology | Admitting: Gynecologic Oncology

## 2014-12-03 DIAGNOSIS — D250 Submucous leiomyoma of uterus: Secondary | ICD-10-CM | POA: Diagnosis not present

## 2014-12-03 DIAGNOSIS — R1031 Right lower quadrant pain: Secondary | ICD-10-CM | POA: Insufficient documentation

## 2014-12-03 DIAGNOSIS — R938 Abnormal findings on diagnostic imaging of other specified body structures: Secondary | ICD-10-CM | POA: Diagnosis not present

## 2014-12-03 DIAGNOSIS — D252 Subserosal leiomyoma of uterus: Secondary | ICD-10-CM | POA: Diagnosis not present

## 2014-12-03 DIAGNOSIS — C539 Malignant neoplasm of cervix uteri, unspecified: Secondary | ICD-10-CM | POA: Diagnosis present

## 2014-12-03 DIAGNOSIS — D251 Intramural leiomyoma of uterus: Secondary | ICD-10-CM | POA: Insufficient documentation

## 2014-12-03 MED ORDER — GADOBENATE DIMEGLUMINE 529 MG/ML IV SOLN
20.0000 mL | Freq: Once | INTRAVENOUS | Status: AC | PRN
  Administered 2014-12-03: 16 mL via INTRAVENOUS

## 2014-12-07 ENCOUNTER — Encounter (HOSPITAL_COMMUNITY)
Admission: RE | Admit: 2014-12-07 | Discharge: 2014-12-07 | Disposition: A | Discharge status: Home / Self Care | Payer: Medicare Other | Source: Ambulatory Visit | Attending: Gynecologic Oncology | Admitting: Gynecologic Oncology

## 2014-12-07 ENCOUNTER — Encounter (HOSPITAL_COMMUNITY): Payer: Self-pay

## 2014-12-07 DIAGNOSIS — Z809 Family history of malignant neoplasm, unspecified: Secondary | ICD-10-CM | POA: Diagnosis not present

## 2014-12-07 DIAGNOSIS — K449 Diaphragmatic hernia without obstruction or gangrene: Secondary | ICD-10-CM | POA: Diagnosis not present

## 2014-12-07 DIAGNOSIS — F17200 Nicotine dependence, unspecified, uncomplicated: Secondary | ICD-10-CM | POA: Diagnosis not present

## 2014-12-07 DIAGNOSIS — Z79818 Long term (current) use of other agents affecting estrogen receptors and estrogen levels: Secondary | ICD-10-CM | POA: Diagnosis not present

## 2014-12-07 DIAGNOSIS — J449 Chronic obstructive pulmonary disease, unspecified: Secondary | ICD-10-CM | POA: Diagnosis not present

## 2014-12-07 DIAGNOSIS — Y838 Other surgical procedures as the cause of abnormal reaction of the patient, or of later complication, without mention of misadventure at the time of the procedure: Secondary | ICD-10-CM | POA: Diagnosis not present

## 2014-12-07 DIAGNOSIS — Z87410 Personal history of cervical dysplasia: Secondary | ICD-10-CM | POA: Diagnosis not present

## 2014-12-07 DIAGNOSIS — D259 Leiomyoma of uterus, unspecified: Secondary | ICD-10-CM | POA: Diagnosis not present

## 2014-12-07 DIAGNOSIS — Z791 Long term (current) use of non-steroidal anti-inflammatories (NSAID): Secondary | ICD-10-CM | POA: Diagnosis not present

## 2014-12-07 DIAGNOSIS — J45909 Unspecified asthma, uncomplicated: Secondary | ICD-10-CM | POA: Diagnosis not present

## 2014-12-07 DIAGNOSIS — C530 Malignant neoplasm of endocervix: Secondary | ICD-10-CM | POA: Diagnosis not present

## 2014-12-07 DIAGNOSIS — D649 Anemia, unspecified: Secondary | ICD-10-CM | POA: Diagnosis not present

## 2014-12-07 DIAGNOSIS — Y92530 Ambulatory surgery center as the place of occurrence of the external cause: Secondary | ICD-10-CM | POA: Diagnosis not present

## 2014-12-07 DIAGNOSIS — N9971 Accidental puncture and laceration of a genitourinary system organ or structure during a genitourinary system procedure: Secondary | ICD-10-CM | POA: Diagnosis not present

## 2014-12-07 DIAGNOSIS — Z79899 Other long term (current) drug therapy: Secondary | ICD-10-CM | POA: Diagnosis not present

## 2014-12-07 DIAGNOSIS — C539 Malignant neoplasm of cervix uteri, unspecified: Secondary | ICD-10-CM | POA: Diagnosis present

## 2014-12-07 HISTORY — DX: Family history of other specified conditions: Z84.89

## 2014-12-07 HISTORY — DX: Sleep disorder, unspecified: G47.9

## 2014-12-07 LAB — CBC WITH DIFFERENTIAL/PLATELET
BASOS ABS: 0 10*3/uL (ref 0.0–0.1)
Basophils Relative: 0 % (ref 0–1)
EOS ABS: 0.2 10*3/uL (ref 0.0–0.7)
EOS PCT: 2 % (ref 0–5)
HEMATOCRIT: 31.3 % — AB (ref 36.0–46.0)
HEMOGLOBIN: 9.1 g/dL — AB (ref 12.0–15.0)
LYMPHS ABS: 2.8 10*3/uL (ref 0.7–4.0)
Lymphocytes Relative: 32 % (ref 12–46)
MCH: 18.8 pg — ABNORMAL LOW (ref 26.0–34.0)
MCHC: 29.1 g/dL — AB (ref 30.0–36.0)
MCV: 64.5 fL — ABNORMAL LOW (ref 78.0–100.0)
MONOS PCT: 7 % (ref 3–12)
Monocytes Absolute: 0.6 10*3/uL (ref 0.1–1.0)
NEUTROS PCT: 59 % (ref 43–77)
Neutro Abs: 5.3 10*3/uL (ref 1.7–7.7)
PLATELETS: 281 10*3/uL (ref 150–400)
RBC: 4.85 MIL/uL (ref 3.87–5.11)
RDW: 20.9 % — ABNORMAL HIGH (ref 11.5–15.5)
WBC: 8.9 10*3/uL (ref 4.0–10.5)

## 2014-12-07 LAB — BASIC METABOLIC PANEL
Anion gap: 8 (ref 5–15)
BUN: 6 mg/dL (ref 6–20)
CO2: 25 mmol/L (ref 22–32)
CREATININE: 0.65 mg/dL (ref 0.44–1.00)
Calcium: 9.2 mg/dL (ref 8.9–10.3)
Chloride: 106 mmol/L (ref 101–111)
GFR calc Af Amer: >60 mL/min (ref >60)
GFR calc non Af Amer: >60 mL/min (ref >60)
GLUCOSE: 96 mg/dL (ref 65–99)
POTASSIUM: 3.6 mmol/L (ref 3.5–5.1)
Sodium: 139 mmol/L (ref 135–145)

## 2014-12-07 LAB — ABO/RH: ABO/RH(D): O POS

## 2014-12-07 LAB — PREGNANCY, URINE: Preg Test, Ur: NEGATIVE

## 2014-12-07 NOTE — Patient Instructions (Addendum)
YOUR PROCEDURE IS SCHEDULED ON : 12/08/14  REPORT TO [facility] MAIN ENTRANCE FOLLOW SIGNS TO SHORT STAY CENTER AT : 10:30 AM  CALL THIS NUMBER IF YOU HAVE PROBLEMS THE MORNING OF SURGERY [phone]  REMEMBER:  DO NOT EAT FOOD  AFTER MIDNIGHT  MAY HAVE CLEAR LIQUIDS UNTIL 6:30 AM  TAKE THESE MEDICINES THE MORNING OF SURGERY: MEGACE / TRAMADOL IF NEEDED / BRING ALBUTEROL INHALER  YOU MAY NOT HAVE ANY METAL ON YOUR BODY INCLUDING HAIR PINS AND PIERCING'S. DO NOT WEAR JEWELRY, MAKEUP, LOTIONS, POWDERS OR PERFUMES. DO NOT WEAR NAIL POLISH. DO NOT SHAVE 48 HRS PRIOR TO SURGERY. MEN MAY SHAVE FACE AND NECK.  DO NOT [place]. [facility] IS NOT RESPONSIBLE FOR VALUABLES.  CONTACTS, DENTURES OR PARTIALS MAY NOT BE WORN TO SURGERY. LEAVE SUITCASE IN CAR. CAN BE BROUGHT TO ROOM AFTER SURGERY.  PATIENTS DISCHARGED THE DAY OF SURGERY WILL NOT BE ALLOWED TO DRIVE HOME.  PLEASE READ OVER THE FOLLOWING INSTRUCTION SHEETS _________________________________________________________________________________                                           - PREPARING FOR SURGERY  Before surgery, you can play an important role.  Because skin is not sterile, your skin needs to be as free of germs as possible.  You can reduce the number of germs on your skin by washing with CHG (chlorahexidine gluconate) soap before surgery.  CHG is an antiseptic cleaner which kills germs and bonds with the skin to continue killing germs even after washing. Please DO NOT use if you have an allergy to CHG or antibacterial soaps.  If your skin becomes reddened/irritated stop using the CHG and inform your nurse when you arrive at Short Stay. Do not shave (including legs and underarms) for at least 48 hours prior to the first CHG shower.  You may shave your face. Please follow these instructions carefully:   1.  Shower with CHG Soap the night before surgery and the  morning of  Surgery.   2.  If you choose to wash your hair, wash your hair first as usual with your  normal  Shampoo.   3.  After you shampoo, rinse your hair and body thoroughly to remove the  shampoo.                                         4.  Use CHG as you would any other liquid soap.  You can apply chg directly  to the skin and wash . Gently wash with scrungie or clean wascloth    5.  Apply the CHG Soap to your body ONLY FROM THE NECK DOWN.   Do not use on open                           Wound or open sores. Avoid contact with eyes, ears mouth and genitals (private parts).                        Genitals (private parts) with your normal soap.              6.  Wash thoroughly, paying special attention  to the area where your surgery  will be performed.   7.  Thoroughly rinse your body with warm water from the neck down.   8.  DO NOT shower/wash with your normal soap after using and rinsing off  the CHG Soap .                9.  Pat yourself dry with a clean towel.             10.  Wear clean night clothes to bed after shower             11.  Place clean sheets on your bed the night of your first shower and do not  sleep with pets.  Day of Surgery : Do not apply any lotions/deodorants the morning of surgery.  Please wear clean clothes to the hospital/surgery center.  FAILURE TO FOLLOW THESE INSTRUCTIONS MAY RESULT IN THE CANCELLATION OF YOUR SURGERY    PATIENT SIGNATURE_________________________________  ______________________________________________________________________     CLEAR LIQUID DIET   Foods Allowed                                                                     Foods Excluded  Coffee and tea, regular and decaf                             liquids that you cannot  Plain Jell-O in any flavor                                             see through such as: Fruit ices (not with fruit pulp)                                     milk, soups, orange juice  Iced Popsicles                                                    All solid food Carbonated beverages, regular and diet                                    Cranberry, grape and apple juices Sports drinks like Gatorade Lightly seasoned clear broth or consume(fat free) Sugar, honey syrup  _____________________________________________________________________    Incentive Spirometer  An incentive spirometer is a tool that can help keep your lungs clear and active. This tool measures how well you are filling your lungs with each breath. Taking long deep breaths may help reverse or decrease the chance of developing breathing (pulmonary) problems (especially infection) following:  A long period of time when you are unable to move or be active. BEFORE THE PROCEDURE   If the spirometer includes an indicator to show your best effort, your nurse or respiratory therapist will set it to  a desired goal.  If possible, sit up straight or lean slightly forward. Try not to slouch.  Hold the incentive spirometer in an upright position. INSTRUCTIONS FOR USE  1. Sit on the edge of your bed if possible, or sit up as far as you can in bed or on a chair. 2. Hold the incentive spirometer in an upright position. 3. Breathe out normally. 4. Place the mouthpiece in your mouth and seal your lips tightly around it. 5. Breathe in slowly and as deeply as possible, raising the piston or the ball toward the top of the column. 6. Hold your breath for 3-5 seconds or for as long as possible. Allow the piston or ball to fall to the bottom of the column. 7. Remove the mouthpiece from your mouth and breathe out normally. 8. Rest for a few seconds and repeat Steps 1 through 7 at least 10 times every 1-2 hours when you are awake. Take your time and take a few normal breaths between deep breaths. 9. The spirometer may include an indicator to show your best effort. Use the indicator as a goal to work toward during each repetition. 10. After each  set of 10 deep breaths, practice coughing to be sure your lungs are clear. If you have an incision (the cut made at the time of surgery), support your incision when coughing by placing a pillow or rolled up towels firmly against it. Once you are able to get out of bed, walk around indoors and cough well. You may stop using the incentive spirometer when instructed by your caregiver.  RISKS AND COMPLICATIONS  Take your time so you do not get dizzy or light-headed.  If you are in pain, you may need to take or ask for pain medication before doing incentive spirometry. It is harder to take a deep breath if you are having pain. AFTER USE  Rest and breathe slowly and easily.  It can be helpful to keep track of a log of your progress. Your caregiver can provide you with a simple table to help with this. If you are using the spirometer at home, follow these instructions: [place] IF:   You are having difficultly using the spirometer.  You have trouble using the spirometer as often as instructed.  Your pain medication is not giving enough relief while using the spirometer.  You develop fever of 100.5 F (38.1 C) or higher. SEEK IMMEDIATE MEDICAL CARE IF:   You cough up bloody sputum that had not been present before.  You develop fever of 102 F (38.9 C) or greater.  You develop worsening pain at or near the incision site. MAKE SURE YOU:   Understand these instructions.  Will watch your condition.  Will get help right away if you are not doing well or get worse. Document Released: 10/30/2006 Document Revised: 09/11/2011 Document Reviewed: 12/31/2006 [facility] Patient Information 2014 [place], [place].   ________________________________________________________________________

## 2014-12-08 ENCOUNTER — Ambulatory Visit (HOSPITAL_COMMUNITY): Payer: Medicare Other | Admitting: Anesthesiology

## 2014-12-08 ENCOUNTER — Ambulatory Visit (HOSPITAL_BASED_OUTPATIENT_CLINIC_OR_DEPARTMENT_OTHER)
Admission: RE | Admit: 2014-12-08 | Discharge: 2014-12-08 | Disposition: A | Discharge status: Home / Self Care | Payer: Medicare Other | Source: Ambulatory Visit | Attending: Gynecologic Oncology | Admitting: Gynecologic Oncology

## 2014-12-08 ENCOUNTER — Ambulatory Visit (HOSPITAL_COMMUNITY)
Admission: RE | Admit: 2014-12-08 | Payer: Medicare Other | Source: Ambulatory Visit | Admitting: Obstetrics & Gynecology

## 2014-12-08 ENCOUNTER — Encounter (HOSPITAL_COMMUNITY)
Admission: RE | Disposition: A | Discharge status: Home / Self Care | Payer: Self-pay | Source: Ambulatory Visit | Attending: Gynecologic Oncology

## 2014-12-08 ENCOUNTER — Encounter (HOSPITAL_COMMUNITY): Payer: Self-pay

## 2014-12-08 ENCOUNTER — Encounter (HOSPITAL_COMMUNITY): Admission: RE | Payer: Self-pay | Source: Ambulatory Visit

## 2014-12-08 DIAGNOSIS — N9971 Accidental puncture and laceration of a genitourinary system organ or structure during a genitourinary system procedure: Secondary | ICD-10-CM | POA: Insufficient documentation

## 2014-12-08 DIAGNOSIS — D259 Leiomyoma of uterus, unspecified: Secondary | ICD-10-CM | POA: Insufficient documentation

## 2014-12-08 DIAGNOSIS — D649 Anemia, unspecified: Secondary | ICD-10-CM | POA: Insufficient documentation

## 2014-12-08 DIAGNOSIS — C530 Malignant neoplasm of endocervix: Secondary | ICD-10-CM | POA: Diagnosis not present

## 2014-12-08 DIAGNOSIS — J449 Chronic obstructive pulmonary disease, unspecified: Secondary | ICD-10-CM | POA: Insufficient documentation

## 2014-12-08 DIAGNOSIS — J45909 Unspecified asthma, uncomplicated: Secondary | ICD-10-CM | POA: Insufficient documentation

## 2014-12-08 DIAGNOSIS — Z79899 Other long term (current) drug therapy: Secondary | ICD-10-CM | POA: Insufficient documentation

## 2014-12-08 DIAGNOSIS — Z87410 Personal history of cervical dysplasia: Secondary | ICD-10-CM | POA: Diagnosis not present

## 2014-12-08 DIAGNOSIS — Z809 Family history of malignant neoplasm, unspecified: Secondary | ICD-10-CM | POA: Insufficient documentation

## 2014-12-08 DIAGNOSIS — Y92530 Ambulatory surgery center as the place of occurrence of the external cause: Secondary | ICD-10-CM | POA: Insufficient documentation

## 2014-12-08 DIAGNOSIS — K449 Diaphragmatic hernia without obstruction or gangrene: Secondary | ICD-10-CM | POA: Insufficient documentation

## 2014-12-08 DIAGNOSIS — C539 Malignant neoplasm of cervix uteri, unspecified: Secondary | ICD-10-CM | POA: Diagnosis present

## 2014-12-08 DIAGNOSIS — Y838 Other surgical procedures as the cause of abnormal reaction of the patient, or of later complication, without mention of misadventure at the time of the procedure: Secondary | ICD-10-CM | POA: Insufficient documentation

## 2014-12-08 DIAGNOSIS — Z79818 Long term (current) use of other agents affecting estrogen receptors and estrogen levels: Secondary | ICD-10-CM | POA: Insufficient documentation

## 2014-12-08 DIAGNOSIS — F17200 Nicotine dependence, unspecified, uncomplicated: Secondary | ICD-10-CM | POA: Insufficient documentation

## 2014-12-08 DIAGNOSIS — Z791 Long term (current) use of non-steroidal anti-inflammatories (NSAID): Secondary | ICD-10-CM | POA: Insufficient documentation

## 2014-12-08 HISTORY — PX: CERVICAL CONIZATION W/BX: SHX1330

## 2014-12-08 LAB — TYPE AND SCREEN
ABO/RH(D): O POS
ANTIBODY SCREEN: NEGATIVE

## 2014-12-08 SURGERY — CONE BIOPSY, CERVIX
Anesthesia: General

## 2014-12-08 SURGERY — ROBOTIC ASSISTED TOTAL HYSTERECTOMY WITH BILATERAL SALPINGO OOPHORECTOMY
Anesthesia: Choice | Laterality: Bilateral

## 2014-12-08 MED ORDER — DEXAMETHASONE SODIUM PHOSPHATE 10 MG/ML IJ SOLN
INTRAMUSCULAR | Status: DC | PRN
  Administered 2014-12-08: 10 mg via INTRAVENOUS

## 2014-12-08 MED ORDER — ONDANSETRON HCL 4 MG/2ML IJ SOLN
INTRAMUSCULAR | Status: AC
  Filled 2014-12-08: qty 2

## 2014-12-08 MED ORDER — DOCUSATE SODIUM 100 MG PO CAPS: 100.0000 mg | ORAL_CAPSULE | Freq: Two times a day (BID) | ORAL | Status: DC

## 2014-12-08 MED ORDER — OXYCODONE-ACETAMINOPHEN 5-325 MG PO TABS
1.0000 | ORAL_TABLET | Freq: Once | ORAL | Status: AC
  Administered 2014-12-08: 1 via ORAL
  Filled 2014-12-08: qty 1

## 2014-12-08 MED ORDER — LIDOCAINE HCL (CARDIAC) 20 MG/ML IV SOLN
INTRAVENOUS | Status: DC | PRN
  Administered 2014-12-08: 80 mg via INTRAVENOUS

## 2014-12-08 MED ORDER — PROPOFOL 10 MG/ML IV BOLUS
INTRAVENOUS | Status: DC | PRN
  Administered 2014-12-08: 200 mg via INTRAVENOUS

## 2014-12-08 MED ORDER — OXYCODONE HCL 5 MG PO TABS
5.0000 mg | ORAL_TABLET | Freq: Once | ORAL | Status: AC
  Administered 2014-12-08: 5 mg via ORAL
  Filled 2014-12-08: qty 1

## 2014-12-08 MED ORDER — FERRIC SUBSULFATE SOLN
1.0000 "application " | Freq: Once | Status: AC
  Administered 2014-12-08: 1 via TOPICAL
  Filled 2014-12-08: qty 100

## 2014-12-08 MED ORDER — FENTANYL CITRATE (PF) 100 MCG/2ML IJ SOLN
INTRAMUSCULAR | Status: DC | PRN
  Administered 2014-12-08 (×2): 25 ug via INTRAVENOUS
  Administered 2014-12-08: 50 ug via INTRAVENOUS

## 2014-12-08 MED ORDER — 0.9 % SODIUM CHLORIDE (POUR BTL) OPTIME
TOPICAL | Status: DC | PRN
  Administered 2014-12-08: 1000 mL

## 2014-12-08 MED ORDER — MIDAZOLAM HCL 5 MG/5ML IJ SOLN
INTRAMUSCULAR | Status: DC | PRN
  Administered 2014-12-08: 2 mg via INTRAVENOUS

## 2014-12-08 MED ORDER — FENTANYL CITRATE (PF) 100 MCG/2ML IJ SOLN
INTRAMUSCULAR | Status: AC
  Filled 2014-12-08: qty 2

## 2014-12-08 MED ORDER — LACTATED RINGERS IV SOLN: INTRAVENOUS | Status: DC

## 2014-12-08 MED ORDER — DEXAMETHASONE SODIUM PHOSPHATE 10 MG/ML IJ SOLN
INTRAMUSCULAR | Status: AC
  Filled 2014-12-08: qty 1

## 2014-12-08 MED ORDER — LIDOCAINE-EPINEPHRINE 1 %-1:100000 IJ SOLN
INTRAMUSCULAR | Status: AC
Start: 2014-12-08 — End: 2014-12-08
  Filled 2014-12-08: qty 1

## 2014-12-08 MED ORDER — OXYCODONE-ACETAMINOPHEN 10-325 MG PO TABS: 1.0000 | ORAL_TABLET | ORAL | Status: DC | PRN

## 2014-12-08 MED ORDER — ONDANSETRON HCL 4 MG/2ML IJ SOLN
INTRAMUSCULAR | Status: DC | PRN
  Administered 2014-12-08: 4 mg via INTRAVENOUS

## 2014-12-08 MED ORDER — LIDOCAINE-EPINEPHRINE 1 %-1:100000 IJ SOLN
INTRAMUSCULAR | Status: DC | PRN
  Administered 2014-12-08: 10 mL

## 2014-12-08 MED ORDER — FENTANYL CITRATE (PF) 100 MCG/2ML IJ SOLN
25.0000 ug | INTRAMUSCULAR | Status: DC | PRN
  Administered 2014-12-08 (×3): 25 ug via INTRAVENOUS

## 2014-12-08 MED ORDER — PROPOFOL 10 MG/ML IV BOLUS
INTRAVENOUS | Status: AC
  Filled 2014-12-08: qty 20

## 2014-12-08 MED ORDER — LIDOCAINE HCL (CARDIAC) 20 MG/ML IV SOLN
INTRAVENOUS | Status: AC
  Filled 2014-12-08: qty 5

## 2014-12-08 MED ORDER — MIDAZOLAM HCL 2 MG/2ML IJ SOLN
INTRAMUSCULAR | Status: AC
  Filled 2014-12-08: qty 2

## 2014-12-08 MED ORDER — MEPERIDINE HCL 50 MG/ML IJ SOLN: 6.2500 mg | INTRAMUSCULAR | Status: DC | PRN

## 2014-12-08 MED ORDER — ACETIC ACID 5 % SOLN
1.0000 "application " | Freq: Once | Status: AC
  Administered 2014-12-08: 1 via TOPICAL
  Filled 2014-12-08: qty 500

## 2014-12-08 MED ORDER — LACTATED RINGERS IV SOLN
INTRAVENOUS | Status: DC
  Administered 2014-12-08: 1000 mL via INTRAVENOUS

## 2014-12-08 MED ORDER — PROMETHAZINE HCL 25 MG/ML IJ SOLN
6.2500 mg | INTRAMUSCULAR | Status: DC | PRN
Start: 2014-12-08 — End: 2014-12-08

## 2014-12-08 SURGICAL SUPPLY — 19 items
CATH ROBINSON RED A/P 16FR (CATHETERS) ×2 IMPLANT
COVER SURGICAL LIGHT HANDLE (MISCELLANEOUS) ×2 IMPLANT
DRAPE LG THREE QUARTER DISP (DRAPES) ×1 IMPLANT
DRSG TELFA 3X8 NADH (GAUZE/BANDAGES/DRESSINGS) ×2 IMPLANT
ELECT BLADE TIP CTD 4 INCH (ELECTRODE) ×1 IMPLANT
GLOVE BIO SURGEON STRL SZ 6 (GLOVE) ×2 IMPLANT
GOWN STRL REUS W/TWL LRG LVL3 (GOWN DISPOSABLE) ×4 IMPLANT
KIT BASIN OR (CUSTOM PROCEDURE TRAY) ×1 IMPLANT
LEGGING LITHOTOMY PAIR STRL (DRAPES) ×1 IMPLANT
NDL SPNL 18GX3.5 QUINCKE PK (NEEDLE) IMPLANT
NEEDLE SPNL 18GX3.5 QUINCKE PK (NEEDLE) ×2 IMPLANT
PACK BASIC (CUSTOM PROCEDURE TRAY) ×1 IMPLANT
PAD DRESSING TELFA 3X8 NADH (GAUZE/BANDAGES/DRESSINGS) ×1 IMPLANT
PEN SKIN MARKING BROAD (MISCELLANEOUS) ×1 IMPLANT
SUT VIC AB 2-0 UR6 27 (SUTURE) ×1 IMPLANT
SUT VICRYL 0 UR6 27IN ABS (SUTURE) ×1 IMPLANT
SYR BULB IRRIGATION 50ML (SYRINGE) ×1 IMPLANT
TOWEL OR 17X26 10 PK STRL BLUE (TOWEL DISPOSABLE) ×2 IMPLANT
UNDERPAD 30X30 INCONTINENT (UNDERPADS AND DIAPERS) ×2 IMPLANT

## 2014-12-08 NOTE — Anesthesia Procedure Notes (Signed)
Procedure Name: LMA Insertion Date/Time: 12/08/2014 2:58 PM Performed by: Glory Buff Pre-anesthesia Checklist: Patient identified, Emergency Drugs available, Suction available, Patient being monitored and Timeout performed Patient Re-evaluated:Patient Re-evaluated prior to inductionOxygen Delivery Method: Circle system utilized Preoxygenation: Pre-oxygenation with 100% oxygen Intubation Type: IV induction LMA: LMA inserted LMA Size: 4.0 Number of attempts: 1 Placement Confirmation: positive ETCO2 Tube secured with: Tape

## 2014-12-08 NOTE — Transfer of Care (Signed)
Immediate Anesthesia Transfer of Care Note  Patient: Melissa Alvarez  Procedure(s) Performed: Procedure(s): COLD KNIFE CONIZATION OF CERVIX (N/A)  Patient Location: PACU  Anesthesia Type:General  Level of Consciousness: awake, alert  and oriented  Airway & Oxygen Therapy: Patient Spontanous Breathing and Patient connected to face mask oxygen  Post-op Assessment: Report given to RN and Post -op Vital signs reviewed and stable  Post vital signs: Reviewed and stable  Last Vitals:  Filed Vitals:   12/08/14 1600  BP:   Pulse: 75  Temp:   Resp:     Complications: No apparent anesthesia complications

## 2014-12-08 NOTE — Op Note (Signed)
OPERATIVE NOTE   PATIENT: Melissa Alvarez DATE: 12/08/14   Preop Diagnosis: cervical cancer (microinvasive)  Postoperative Diagnosis: same  Surgery: cold knife conization of cervix   Surgeons:  [name], MD Assistant: none  Anesthesia: General   Estimated blood loss: <23ml  IVF:  264ml   Urine output: 644 ml   Complications: None   Pathology: cervical cone with marking stitch at 12 o'clock, post cone ECC  Operative findings: Bulky fibroid uterus. Normal appearing cervix with no acetowhite changes.   Procedure: The patient was identified in the preoperative holding area. Informed consent was signed on the chart. Patient was seen history was reviewed and exam was performed.   The patient was then taken to the operating room and placed in the supine position with SCD hose on. General anesthesia was then induced without difficulty. She was then placed in the dorsolithotomy position. The perineum was prepped with Betadine. The vagina was prepped with Betadine. The patient was then draped after the prep was dried. An in and out catheterization to empty the bladder was performed under sterile conditions.  Timeout was performed the patient, procedure, antibiotic, allergy, and length of procedure.   The weighted speculum was placed in the posterior vagina. The right angle retractor was placed anteriorally to visualize the cervix. A 0-vicryl suture on a UR-6 needle was used to place stay sutures (which were tagged) at 3 and 9 o'clock of the cervicovaginal junction. 10cc of 1% lidocaine with epinephrine was infiltrated into 3 and 9 o'clock of the cervicovaginal junction, circumferentially around the face of the cervix, and deep in the endocervical canal. The uterine sound was placed in the cervix to delineate the course of the endocervical canal. An 11 blade scalpel on a long knife handle was used to make an incision around the face of the cervix, inside of the stay  sutures, circumferentially. A single tooth tenaculum grasped the specimen to manipulate it. The incision was then angled towards the endocervix to amputate the specimen. It was removed, oriented with a marking stitch at the 12 o'clock ectocervix and sent to pathology. It was measured to be 3cm deep, by 2x2cm on the ectocervical face. At this time it was noted that there was a breach in the posterior lower uterine segment wall/proximal posterior cervix into the peritoneal cavity. It measured 2cm. The anterior wall of the rectum was clearly visible through this hole and was noted to be in tact. The peritoneal defect in the posterior cul de sac was closed with 2-0 vicryl running suture. The defect was completely palpably closed with this closure.   A post-cone ECC was collected from the endocervical canal using a kavorkian currette.  The bovie with the 82mm ball attachment was used at 45 coag to create hemostasis at the cervical edges. Monsels solution was applied to the surgical bed to consolidate this hemostasis.  The vagina was irrigated.  All instrument, suture, laparotomy, Ray-Tec, and needle counts were correct x2. The patient tolerated the procedure well and was taken recovery room in stable condition. This is Melissa Alvarez dictating an operative note on Melissa Alvarez.  [name], MD

## 2014-12-08 NOTE — H&P (View-Only) (Signed)
Consult Note: Gyn-Onc  Consult was requested by Dr. Ihor Dow for the evaluation of Melissa Alvarez 39 y.o. female with suspected stage IA cervical cancer and a fibroid uterus.  CC:  Chief Complaint  Patient presents with  . Cervical Cancer    Assessment/Plan:  Melissa Alvarez  is a 39 y.o.  year old with what appears to be a stage IA cervical squamous cell carcinoma, in the setting of anemia and a symptomatic fibroid uterus.  I performed a history, physical examination, and personally reviewed the patient's imaging films including the ultrasound from January, 2016 and the CT abdo/pelvis from December 2015.  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 Therefore I agree with Dr Vladimir Creeks assessment that this is most likely cervical in origin despite it appearing on the endometrial biopsy.  In order to establish the true stage of this lesion, I'm recommending first proceeding with an MRI of the pelvis to assess for an occult endocervical mass, followed by a cold conization of the cervix to discriminate microscopic size parameters that would differentiate a IA1 versus IA2 lesion with subsequent determination of the best classification of hysterectomy to perform thereafter (extrafascial hysterectomy versus radical hysterectomy).  She has chronic anemia. She will require being type and screened for her CKC and type and crossed, and possibly transfused preoperatively before her hysterectomy.  HPI: Melissa Alvarez is a 39 year old parous woman who is seen in consultation at the request of Dr Ihor Dow for squamous cell carcinoma on endometrial biopsy.   The patient reports a long-standing history of cervical dysplasia and excisional procedures. This began when she was 39 years of age. She's had approximate 607 LEEP or cold left  conization's since age 39. The most recent was in 2012 in [place]. She is unclear what the final pathology on margin status was from the procedure. 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 She denies HIV infection. Her most recent Pap was performed here in [place] on 10/12/2014 and was negative for dysplasia on cytology however high risk HPV was detected she was desiring hysterectomy for menorrhagia which she's had for approximately 18 years since the birth of her second daughter. His been attributed to fibroid uterus. She also has pelvic pain.   As part of the workup for her planned hysterectomy the patient underwent an endometrial pelvic. This was largely because her endometrial stripe was thickened at 11 millimeters on ultrasound. This biopsy was performed on 11/09/2014. It demonstrated fragments of squamous cell carcinoma, and an endometrioid-type polyp with complex hyperplasia without atypia. She is being treated with Megace therapy for her hyperplasia and abnormal bleeding.  She also has a long-standing history of chronic anemia. In [place] she used to receive blood transfusions approximately every 3-6 months. She denies knowing an etiology for her anemia. She denies having history of sickle cell disease or thalassemia.  She is a long-standing tobacco user, and has resultant COPD and asthma.   Current Meds:  Outpatient Encounter Prescriptions as of 11/24/2014  Medication Sig  . megestrol (MEGACE) 20 MG tablet Take 2 tablets (40 mg total) by mouth daily.  . naproxen (NAPROSYN) 500 MG tablet Take 1 tablet (500 mg total) by mouth 2 (two) times daily with a meal.  . oxyCODONE-acetaminophen (PERCOCET/ROXICET) 5-325 MG per tablet Take 1-2 tablets by mouth every 4 (four) hours as needed  for severe pain.  . traMADol (ULTRAM) 50 MG tablet Take 1 tablet (50 mg total) by mouth every 6 (six) hours as needed. (Patient taking differently: Take 50 mg by mouth every 6 (six) hours as needed for  moderate pain. )  . [DISCONTINUED] oxyCODONE-acetaminophen (PERCOCET/ROXICET) 5-325 MG per tablet Take 1-2 tablets by mouth every 4 (four) hours as needed for severe pain.  Marland Kitchen albuterol (PROVENTIL HFA;VENTOLIN HFA) 108 (90 BASE) MCG/ACT inhaler Inhale 2 puffs into the lungs once. (Patient not taking: Reported on 11/24/2014)   No facility-administered encounter medications on file as of 11/24/2014.    Allergy:  Allergies  Allergen Reactions  . Acetaminophen Hives    With codeine   . Other Hives    Clinical steroids except for solu medrol  . Penicillins Hives, Itching and Rash  . Doxycycline Hives, Itching and Rash  . Erythromycin Hives, Itching and Rash  . Motrin [Ibuprofen] Hives, Itching and Rash    Social Hx:   History   Social History  . Marital Status: Single    Spouse Name: N/A  . Number of Children: N/A  . Years of Education: N/A   Occupational History  . Not on file.   Social History Main Topics  . Smoking status: Current Every Day Smoker -- 3.00 packs/day for 24 years  . Smokeless tobacco: Not on file  . Alcohol Use: Yes     Comment: occasional  . Drug Use: Yes    Special: Marijuana     Comment: occasional, situational, to relieve stress  . Sexual Activity: Not on file   Other Topics Concern  . Not on file   Social History Narrative    Past Surgical Hx:  Past Surgical History  Procedure Laterality Date  . Appendectomy    . Cholecystectomy    . Tonsillectomy    . Cervical cone biopsy      x 6  . Cesarean section      x 2  . Shoulder surgery Left     Past Medical Hx:  Past Medical History  Diagnosis Date  . Asthma   . Cancer     cervical  . Anemia   . History of blood transfusion   . Heart valve disorder   . COPD (chronic obstructive pulmonary disease)   . Gastric ulcer     x 5    Past Gynecological History:  C/s x 2. Longstanding history of cervical dysplasia and 7 excisional procedures since age 39.  Patient's last menstrual period was  11/16/2014.  Family Hx:  Family History  Problem Relation Age of Onset  . Cancer Mother   . Hypertension Mother   . Diabetes Mother   . CAD Mother   . Diabetes Other   . Hypertension Other   . Cancer Other   . CAD Other     Review of Systems:  Constitutional  Feels well,    ENT Normal appearing ears and nares bilaterally Skin/Breast  No rash, sores, jaundice, itching, dryness Cardiovascular  No chest pain, shortness of breath, or edema  Pulmonary  No cough or wheeze.  Gastro Intestinal  No nausea, vomitting, or diarrhoea. No bright red blood per rectum, + abdominal pain, change in bowel movement, or constipation.  Genito Urinary  No frequency, urgency, dysuria, + pelvic pain Musculo Skeletal  No myalgia, arthralgia, joint swelling or pain  Neurologic  No weakness, numbness, change in gait,  Psychology  No depression, anxiety, insomnia.   Vitals:  Blood pressure 114/75,  pulse 74, temperature 98.3 F (36.8 C), temperature source Oral, resp. rate 20, height 5\' 6"  (1.676 m), weight 181 lb 9.6 oz (82.373 kg), last menstrual period 11/16/2014.  Physical Exam: WD in NAD Neck  Supple NROM, without any enlargements.  Lymph Node Survey No cervical supraclavicular or inguinal adenopathy Cardiovascular  Pulse normal rate, regularity and rhythm. S1 and S2 normal.  Lungs  Clear to auscultation bilateraly, without wheezes/crackles/rhonchi. Good air movement.  Skin  No rash/lesions/breakdown  Psychiatry  Alert and oriented to person, place, and time  Abdomen  Normoactive bowel sounds, abdomen soft, non-tender and overweight without evidence of hernia.  Back No CVA tenderness Genito Urinary  Vulva/vagina: Normal external female genitalia.  No lesions. No discharge or bleeding.  Bladder/urethra:  No lesions or masses, well supported bladder  Vagina: Normal in appearance  Cervix: Normal appearing, no lesions. Displaced anteriorally by posterior uterine fibroid.   Uterus:  Bulky 16 week size fibroid uterus with dominant posterior fibroid. No parametrial nodularity.  Adnexa: no discrete palpable masses. Rectal  Good tone, no masses no cul de sac nodularity.  Extremities  No bilateral cyanosis, clubbing or edema.  [name], MD   11/24/2014, 3:30 PM

## 2014-12-08 NOTE — Discharge Instructions (Signed)
12/08/2014  Return to work: 2 days  Activity: 1. Be up and out of the bed during the day.  Take a nap if needed.  You may walk up steps but be careful and use the hand rail.  Stair climbing will tire you more than you think, you may need to stop part way and rest.   2. No lifting or straining for 48 hours.  3. No driving for 1 day.  Do Not drive if you are taking narcotic pain medicine.  4. Shower daily.   5. No sexual activity and nothing in the vagina for 6 weeks.  Diet: 1. Low sodium Heart Healthy Diet is recommended.  2. It is safe to use a laxative if you have difficulty moving your bowels.   Wound Care: 1. Keep clean and dry.  Shower daily.  Reasons to call the Doctor:   Fever - Oral temperature greater than 100.4 degrees Fahrenheit  Foul-smelling vaginal discharge  Difficulty urinating  Nausea and vomiting  Increased pain that is unrelieved with pain medicine.  Difficulty breathing with or without chest pain  New calf pain especially if only on one side  Sudden, continuing increased vaginal bleeding with or without clots.   Follow-up: 1. See [name] in 3 weeks.  Contacts: For questions or concerns you should contact:  Dr. [name] at [phone]  or at [place]        General Anesthesia, Care After Refer to this sheet in the next few weeks. These instructions provide you with information on caring for yourself after your procedure. Your health care provider may also give you more specific instructions. Your treatment has been planned according to current medical practices, but problems sometimes occur. Call your health care provider if you have any problems or questions after your procedure. WHAT TO EXPECT AFTER THE PROCEDURE After the procedure, it is typical to experience:  Sleepiness.  Nausea and vomiting. HOME CARE INSTRUCTIONS  For the first 24 hours after general anesthesia:  Have a responsible person with you.  Do  not drive a car. If you are alone, do not take public transportation.  Do not drink alcohol.  Do not take medicine that has not been prescribed by your health care provider.  Do not sign important papers or make important decisions.  You may resume a normal diet and activities as directed by your health care provider.  Change bandages (dressings) as directed.  If you have questions or problems that seem related to general anesthesia, call the hospital and ask for the anesthetist or anesthesiologist on call. SEEK MEDICAL CARE IF:  You have nausea and vomiting that continue the day after anesthesia.  You develop a rash. SEEK IMMEDIATE MEDICAL CARE IF:   You have difficulty breathing.  You have chest pain.  You have any allergic problems. Document Released: 09/25/2000 Document Revised: 06/24/2013 Document Reviewed: 01/02/2013 [facility] [employer] Patient Information 2015 [place], [place]. This information is not intended to replace advice given to you by your health care provider. Make sure you discuss any questions you have with your health care provider.

## 2014-12-08 NOTE — Progress Notes (Signed)
Patient has a small amount of vaginal spotting after cold knife conization of the cervix. Mesh panty and pad applied. She is instructed to call MD if she saturates one pad per hour for 2 hours. Patient and family verbalize understanding.

## 2014-12-08 NOTE — Anesthesia Preprocedure Evaluation (Addendum)
Anesthesia Evaluation  Patient identified by MRN, date of birth, ID band Patient awake    Reviewed: Allergy & Precautions, NPO status , Patient's Chart, lab work & pertinent test results  Airway Mallampati: II  TM Distance: >3 FB Neck ROM: Full    Dental no notable dental hx.    Pulmonary neg pulmonary ROS, asthma , COPDCurrent Smoker,  breath sounds clear to auscultation  Pulmonary exam normal       Cardiovascular negative cardio ROS Normal cardiovascular examRhythm:Regular Rate:Normal     Neuro/Psych negative neurological ROS  negative psych ROS   GI/Hepatic negative GI ROS, Neg liver ROS, hiatal hernia,   Endo/Other  negative endocrine ROS  Renal/GU negative Renal ROS  negative genitourinary   Musculoskeletal negative musculoskeletal ROS (+)   Abdominal   Peds negative pediatric ROS (+)  Hematology negative hematology ROS (+)   Anesthesia Other Findings   Reproductive/Obstetrics negative OB ROS                           Anesthesia Physical Anesthesia Plan  ASA: II  Anesthesia Plan: General   Post-op Pain Management:    Induction: Intravenous  Airway Management Planned: LMA  Additional Equipment:   Intra-op Plan:   Post-operative Plan: Extubation in OR  Informed Consent: I have reviewed the patients History and Physical, chart, labs and discussed the procedure including the risks, benefits and alternatives for the proposed anesthesia with the patient or authorized representative who has indicated his/her understanding and acceptance.   Dental advisory given  Plan Discussed with: CRNA  Anesthesia Plan Comments:         Anesthesia Quick Evaluation

## 2014-12-08 NOTE — Interval H&P Note (Signed)
History and Physical Interval Note:  12/08/2014 11:15 AM  Melissa Alvarez  has presented today for surgery, with the diagnosis of cervical cancer  The various methods of treatment have been discussed with the patient and family. After consideration of risks, benefits and other options for treatment, the patient has consented to  Procedure(s): [place]  (N/A) as a surgical intervention .  The patient's history has been reviewed, patient examined, no change in status, stable for surgery.  I have reviewed the patient's chart and labs.  Questions were answered to the patient's satisfaction.     [name]

## 2014-12-09 ENCOUNTER — Encounter (HOSPITAL_COMMUNITY): Payer: Self-pay | Admitting: Gynecologic Oncology

## 2014-12-09 NOTE — Anesthesia Postprocedure Evaluation (Signed)
  Anesthesia Post-op Note  Patient: Melissa Alvarez  Procedure(s) Performed: Procedure(s) (LRB): COLD KNIFE CONIZATION OF CERVIX (N/A)  Patient Location: PACU  Anesthesia Type: General  Level of Consciousness: awake and alert   Airway and Oxygen Therapy: Patient Spontanous Breathing  Post-op Pain: mild  Post-op Assessment: Post-op Vital signs reviewed, Patient's Cardiovascular Status Stable, Respiratory Function Stable, Patent Airway and No signs of Nausea or vomiting  Last Vitals:  Filed Vitals:   12/08/14 1759  BP: 142/87  Pulse: 81  Temp: 37.1 C  Resp: 16    Post-op Vital Signs: stable   Complications: No apparent anesthesia complications

## 2014-12-11 ENCOUNTER — Telehealth: Payer: Self-pay | Admitting: Gynecologic Oncology

## 2014-12-11 NOTE — Telephone Encounter (Signed)
Called and left message to call back. Patient has cervical cancer on her cone. Because of the cervical cancer in the ECC specimen she will require a type 2 modified radical hyst and nodes. We will plan for this 6 weeks after her cone.

## 2014-12-16 ENCOUNTER — Emergency Department (HOSPITAL_COMMUNITY): Payer: Medicare Other

## 2014-12-16 ENCOUNTER — Encounter (HOSPITAL_COMMUNITY): Payer: Self-pay | Admitting: *Deleted

## 2014-12-16 ENCOUNTER — Emergency Department (HOSPITAL_COMMUNITY)
Admission: EM | Admit: 2014-12-16 | Discharge: 2014-12-17 | Disposition: A | Discharge status: Home / Self Care | Payer: Medicare Other | Attending: Emergency Medicine | Admitting: Emergency Medicine

## 2014-12-16 ENCOUNTER — Telehealth: Payer: Self-pay | Admitting: Gynecologic Oncology

## 2014-12-16 DIAGNOSIS — R000 Tachycardia, unspecified: Secondary | ICD-10-CM | POA: Insufficient documentation

## 2014-12-16 DIAGNOSIS — Z3202 Encounter for pregnancy test, result negative: Secondary | ICD-10-CM | POA: Insufficient documentation

## 2014-12-16 DIAGNOSIS — Z862 Personal history of diseases of the blood and blood-forming organs and certain disorders involving the immune mechanism: Secondary | ICD-10-CM | POA: Diagnosis not present

## 2014-12-16 DIAGNOSIS — Z8669 Personal history of other diseases of the nervous system and sense organs: Secondary | ICD-10-CM | POA: Diagnosis not present

## 2014-12-16 DIAGNOSIS — Z9889 Other specified postprocedural states: Secondary | ICD-10-CM | POA: Diagnosis not present

## 2014-12-16 DIAGNOSIS — J449 Chronic obstructive pulmonary disease, unspecified: Secondary | ICD-10-CM | POA: Diagnosis not present

## 2014-12-16 DIAGNOSIS — Z9049 Acquired absence of other specified parts of digestive tract: Secondary | ICD-10-CM | POA: Diagnosis not present

## 2014-12-16 DIAGNOSIS — Z79899 Other long term (current) drug therapy: Secondary | ICD-10-CM | POA: Insufficient documentation

## 2014-12-16 DIAGNOSIS — Z880 Allergy status to penicillin: Secondary | ICD-10-CM | POA: Insufficient documentation

## 2014-12-16 DIAGNOSIS — K5730 Diverticulosis of large intestine without perforation or abscess without bleeding: Secondary | ICD-10-CM | POA: Diagnosis not present

## 2014-12-16 DIAGNOSIS — N839 Noninflammatory disorder of ovary, fallopian tube and broad ligament, unspecified: Secondary | ICD-10-CM | POA: Diagnosis not present

## 2014-12-16 DIAGNOSIS — Z8541 Personal history of malignant neoplasm of cervix uteri: Secondary | ICD-10-CM | POA: Insufficient documentation

## 2014-12-16 DIAGNOSIS — N838 Other noninflammatory disorders of ovary, fallopian tube and broad ligament: Secondary | ICD-10-CM | POA: Diagnosis not present

## 2014-12-16 DIAGNOSIS — N3289 Other specified disorders of bladder: Secondary | ICD-10-CM | POA: Diagnosis not present

## 2014-12-16 DIAGNOSIS — R1030 Lower abdominal pain, unspecified: Secondary | ICD-10-CM

## 2014-12-16 DIAGNOSIS — Z720 Tobacco use: Secondary | ICD-10-CM | POA: Diagnosis not present

## 2014-12-16 DIAGNOSIS — Z8719 Personal history of other diseases of the digestive system: Secondary | ICD-10-CM | POA: Insufficient documentation

## 2014-12-16 LAB — CBC WITH DIFFERENTIAL/PLATELET
Basophils Absolute: 0 10*3/uL (ref 0.0–0.1)
Basophils Relative: 0 % (ref 0–1)
Eosinophils Absolute: 0.3 10*3/uL (ref 0.0–0.7)
Eosinophils Relative: 2 % (ref 0–5)
HCT: 34.5 % — ABNORMAL LOW (ref 36.0–46.0)
Hemoglobin: 10 g/dL — ABNORMAL LOW (ref 12.0–15.0)
LYMPHS ABS: 3.9 10*3/uL (ref 0.7–4.0)
Lymphocytes Relative: 28 % (ref 12–46)
MCH: 18.7 pg — AB (ref 26.0–34.0)
MCHC: 29 g/dL — ABNORMAL LOW (ref 30.0–36.0)
MCV: 64.6 fL — AB (ref 78.0–100.0)
MONO ABS: 1.3 10*3/uL — AB (ref 0.1–1.0)
Monocytes Relative: 9 % (ref 3–12)
NEUTROS PCT: 61 % (ref 43–77)
Neutro Abs: 8.6 10*3/uL — ABNORMAL HIGH (ref 1.7–7.7)
PLATELETS: 262 10*3/uL (ref 150–400)
RBC: 5.34 MIL/uL — ABNORMAL HIGH (ref 3.87–5.11)
RDW: 21.2 % — AB (ref 11.5–15.5)
WBC: 14.1 10*3/uL — AB (ref 4.0–10.5)

## 2014-12-16 LAB — COMPREHENSIVE METABOLIC PANEL
ALBUMIN: 4.2 g/dL (ref 3.5–5.0)
ALT: 67 U/L — AB (ref 14–54)
AST: 49 U/L — ABNORMAL HIGH (ref 15–41)
Alkaline Phosphatase: 74 U/L (ref 38–126)
Anion gap: 11 (ref 5–15)
BUN: 6 mg/dL (ref 6–20)
CALCIUM: 9.4 mg/dL (ref 8.9–10.3)
CHLORIDE: 104 mmol/L (ref 101–111)
CO2: 22 mmol/L (ref 22–32)
Creatinine, Ser: 0.65 mg/dL (ref 0.44–1.00)
GFR calc non Af Amer: >60 mL/min (ref >60)
Glucose, Bld: 95 mg/dL (ref 65–99)
Potassium: 4 mmol/L (ref 3.5–5.1)
SODIUM: 137 mmol/L (ref 135–145)
TOTAL PROTEIN: 8.1 g/dL (ref 6.5–8.1)
Total Bilirubin: 0.4 mg/dL (ref 0.3–1.2)

## 2014-12-16 LAB — URINALYSIS, ROUTINE W REFLEX MICROSCOPIC
Glucose, UA: NEGATIVE mg/dL
Ketones, ur: NEGATIVE mg/dL
Nitrite: NEGATIVE
Protein, ur: NEGATIVE mg/dL
Specific Gravity, Urine: 1.026 (ref 1.005–1.030)
UROBILINOGEN UA: 1 mg/dL (ref 0.0–1.0)
pH: 5.5 (ref 5.0–8.0)

## 2014-12-16 LAB — WET PREP, GENITAL
TRICH WET PREP: NONE SEEN
Yeast Wet Prep HPF POC: NONE SEEN

## 2014-12-16 LAB — URINE MICROSCOPIC-ADD ON

## 2014-12-16 LAB — LIPASE, BLOOD: LIPASE: 21 U/L — AB (ref 22–51)

## 2014-12-16 LAB — POC URINE PREG, ED: Preg Test, Ur: NEGATIVE

## 2014-12-16 MED ORDER — ONDANSETRON HCL 4 MG/2ML IJ SOLN
4.0000 mg | Freq: Once | INTRAMUSCULAR | Status: AC
  Administered 2014-12-16: 4 mg via INTRAVENOUS
  Filled 2014-12-16: qty 2

## 2014-12-16 MED ORDER — MORPHINE SULFATE 4 MG/ML IJ SOLN
6.0000 mg | Freq: Once | INTRAMUSCULAR | Status: AC
  Administered 2014-12-16: 6 mg via INTRAVENOUS
  Filled 2014-12-16: qty 2

## 2014-12-16 MED ORDER — MORPHINE SULFATE 4 MG/ML IJ SOLN
4.0000 mg | Freq: Once | INTRAMUSCULAR | Status: AC
  Administered 2014-12-16: 4 mg via INTRAVENOUS
  Filled 2014-12-16: qty 1

## 2014-12-16 MED ORDER — IOHEXOL 300 MG/ML  SOLN
100.0000 mL | Freq: Once | INTRAMUSCULAR | Status: AC | PRN
  Administered 2014-12-16: 100 mL via INTRAVENOUS

## 2014-12-16 MED ORDER — SODIUM CHLORIDE 0.9 % IV BOLUS (SEPSIS)
1000.0000 mL | Freq: Once | INTRAVENOUS | Status: AC
  Administered 2014-12-16: 1000 mL via INTRAVENOUS

## 2014-12-16 MED ORDER — IOHEXOL 300 MG/ML  SOLN
50.0000 mL | Freq: Once | INTRAMUSCULAR | Status: AC | PRN
  Administered 2014-12-16: 50 mL via ORAL

## 2014-12-16 NOTE — ED Notes (Signed)
Pt reports she had cone biopsy on 6/7, pt determined to have cervical cancer. No chemo or radiation yet. Pt reports she has had abdominal pain since biopsy. Pain 9/10. Cancer doctor told pt to come to ED.

## 2014-12-16 NOTE — Progress Notes (Signed)
EDCM spoke to patient at bedside.  Patient confirms she has [employer].  EDCM provided patient with list of pcps who accept Medicare insurance within a ten mile radius of patient's zip code.  Patient thankful for resources.  No further EDCM needs at this time.

## 2014-12-16 NOTE — ED Notes (Signed)
Pt reports having constant discomfort since cervical biopsy on 6/7.  Pt states she feels like she has a "tight band" around her lower abdomen.  Pt denies urinary symptoms and states her bowel movements are normal.  Pt states she vomited at 0200 today and has been nauseated since that time.

## 2014-12-16 NOTE — ED Notes (Signed)
Patient transported to CT 

## 2014-12-16 NOTE — Telephone Encounter (Signed)
Patient called this am with complaints of sharp lower abdominal pain with abdominal tightness.  She reports being nauseated since surgery but began vomiting at 2 am this morning.  Decreased PO intake due to nausea.  She reports light vaginal spotting since her CKC.  She reports having bowel movements and passing flatus.  Voiding but reporting increased abdominal pain when urinating.  Situation discussed with Dr. Denman George.  Dr. Denman George spoke with the patient and advised her to seek care at [facility] ED.  Charge RN notified that the patient would be coming around 4 pm today and Dr. Serita Grit recommendations for probable CT scan.  Her cell phone was also given to the staff for any questions.

## 2014-12-16 NOTE — Telephone Encounter (Signed)
Returned patient's call for significant abdominal pains x 2 days and nausea x 5 days (since POD 2). She is s8 days s/p cold knife conization of the cervix. She feels rectal fullness and has bilious emesis and watery stools. I am concerned about pelvic collection/abscess or occult injury to pelvic viscera at the time of her cold knife cone. I recommended she be seen immediately in the ER for lab and imaging (ideally CT) workup, and possible admission to hospital if a significant etiology is identified.  She is not able to get child care or a ride until 4pm. She states she will go to [facility] ER at that time.  [name], MD

## 2014-12-16 NOTE — ED Provider Notes (Signed)
CSN: [id number]     Arrival date & time 12/16/14  1837 History   First MD Initiated Contact with Patient 12/16/14 1955     Chief Complaint  Patient presents with  . abd pain since biopsy on 6/7      (Consider location/radiation/quality/duration/timing/severity/associated sxs/prior Treatment) HPI   39 year old female presenting for evaluation of abdominal pain. Patient with history of cervical cancer, status post cold knife conization of the cervix 8 days ago. For the past 5 days she has been endorsing persistent nausea. For the past 2 days she is having significant abdominal pain. Her pain is described as a sharp bandlike sensation across the belt line, persistent, radiates to the rectum and the vagina. It is sharp and crampy, 10 out of 10. Pain improves with taking her pain medication but once the medication loses effect, her pain worsened. She also endorsed persistent nausea, and has vomited 3 times today. Does endorse mild vaginal discharge since the procedure with brownish discharge. She mentioned that she has a persistent left-sided chest discomfort with occasional shortness of breath but without cough. She did notified her doctor of the symptoms, and was recommended to come to the ER for further evaluation. She denies having any significant fever, severe headache, hemoptysis, back pain, dysuria, hematuria, hematochezia, or melena.      Past Medical History  Diagnosis Date  . Asthma   . Cancer     cervical  . Anemia   . History of blood transfusion   . COPD (chronic obstructive pulmonary disease)   . Gastric ulcer     x 5  . Family history of adverse reaction to anesthesia   . Difficulty sleeping    Past Surgical History  Procedure Laterality Date  . Appendectomy    . Cholecystectomy    . Tonsillectomy    . Cervical cone biopsy      x 6  . Cesarean section      x 2  . Shoulder surgery Left   . Cervical conization w/bx N/A 12/08/2014    Procedure: COLD KNIFE CONIZATION OF  CERVIX;  Surgeon: [name], MD;  Location: [facility];  Service: Gynecology;  Laterality: N/A;   Family History  Problem Relation Age of Onset  . Cancer Mother   . Hypertension Mother   . Diabetes Mother   . CAD Mother   . Diabetes Other   . Hypertension Other   . Cancer Other   . CAD Other    History  Substance Use Topics  . Smoking status: Current Every Day Smoker -- 3.00 packs/day for 24 years  . Smokeless tobacco: Not on file  . Alcohol Use: Yes     Comment: occasional   OB History    Gravida Para Term Preterm AB TAB SAB Ectopic Multiple Living   2 2 2             Review of Systems  All other systems reviewed and are negative.     Allergies  Acetaminophen; Other; Penicillins; Doxycycline; Erythromycin; and Motrin  Home Medications   Prior to Admission medications   Medication Sig Start Date End Date Taking? Authorizing Provider  albuterol (PROVENTIL HFA;VENTOLIN HFA) 108 (90 BASE) MCG/ACT inhaler Inhale 2 puffs into the lungs once. Patient taking differently: Inhale 2 puffs into the lungs every 6 (six) hours as needed for wheezing or shortness of breath.  06/25/14  Yes Linton Flemings, MD  megestrol (MEGACE) 20 MG tablet Take 2 tablets (40 mg total) by mouth daily.  10/12/14  Yes Lavonia Drafts, MD  oxyCODONE-acetaminophen (PERCOCET) 10-325 MG per tablet Take 1 tablet by mouth every 4 (four) hours as needed for pain. 12/08/14  Yes [name], MD  docusate sodium (COLACE) 100 MG capsule Take 1 capsule (100 mg total) by mouth 2 (two) times daily. Patient not taking: Reported on 12/16/2014 12/08/14   [name], MD  naproxen (NAPROSYN) 500 MG tablet Take 1 tablet (500 mg total) by mouth 2 (two) times daily with a meal. Patient not taking: Reported on 12/16/2014 10/12/14   Lavonia Drafts, MD  oxyCODONE-acetaminophen (PERCOCET/ROXICET) 5-325 MG per tablet Take 1-2 tablets by mouth every 4 (four) hours as needed for severe pain. Patient not taking: Reported on 12/03/2014  11/24/14   [name], MD  traMADol (ULTRAM) 50 MG tablet Take 1 tablet (50 mg total) by mouth every 6 (six) hours as needed. Patient not taking: Reported on 12/16/2014 10/12/14   Lavonia Drafts, MD   BP 136/83 mmHg  Pulse 102  Temp(Src) 98.6 F (37 C) (Oral)  Resp 16  SpO2 94%  LMP 08/18/2014 (Approximate) Physical Exam  Constitutional: She appears well-developed and well-nourished. No distress.  HENT:  Head: Atraumatic.  Eyes: Conjunctivae are normal.  Neck: Neck supple.  Cardiovascular:  Mild tachycardia. No murmur rubs or gallops  Pulmonary/Chest: She exhibits tenderness (lleft chest tenderness to palpation, no rash.).  Rhonchus breath sounds without wheezes or rales  Abdominal: Soft. Bowel sounds are normal. She exhibits no distension. There is tenderness (tenderness across lower abdomen with guarding but no rebound tenderness.).  Genitourinary:  Chaperone present during exam. No inguinal lymphadenopathy or inguinal hernia noted. Normal external genitalia. Vaginal vault with moderate yellow discharge. Cervical os with moderate dystrophic skin changes with yellow discharge, friable. On bimanual examination, significant discomfort both with adnexal tenderness and cervical motion tenderness. Examination is limited due to patient's discomfort.  Neurological: She is alert.  Skin: No rash noted.  Psychiatric: She has a normal mood and affect.  Nursing note and vitals reviewed.   ED Course  Procedures (including critical care time)  Patient who had a cold knife conization of cervix 3 days ago here with worsening abdominal pain concerning for possible infection. Significant discomfort with pelvic examination. Abdominal pelvic CT scan ordered. Pain medication and antinausea medication given.  11:48 PM Patient has an elevated white count with a WBC 14.1, no left shift. Wet prep shows many WBC, a few clue cells.she has no fever, vital signs stable.abdominal and pelvis CT scan  demonstrated a right ovarian mass which has increased in size, measuring [phone] cm. This served for malignancy. There is mild edema noted to the right side of cervix which may be post operative in nature. No evidence of abscess at this time. Mild diverticulosis without evidence of diverticulitis.  Plan to consult Dr. Denman George, pt's OBGYN  12:39 AM I have attempted to contact Dr. Denman George without success.  Number tried [phone] and [phone].  Our unit secretary also has tried to get intouch with oncall oncology OBGYN without success.  Pt at this time request to be discharge.  She feels better.  She agrees to call and f/u closely with her OBGYN for further care.  I did discussed the finding of her CT scan including enlargement of her R ovary concerning for malignancy.  Pt is currently awaits biopsy result that Dr. Denman George has previously obtained.  I also sent GC/Ch culture.  Otherwise, will hold off on abx treatment at this time.  Will prescribe antinausea and  pain medication as needed.   Care discussed with Dr. Doy Mince    Labs Review Labs Reviewed  CBC WITH DIFFERENTIAL/PLATELET - Abnormal; Notable for the following:    WBC 14.1 (*)    RBC 5.34 (*)    Hemoglobin 10.0 (*)    HCT 34.5 (*)    MCV 64.6 (*)    MCH 18.7 (*)    MCHC 29.0 (*)    RDW 21.2 (*)    Neutro Abs 8.6 (*)    Monocytes Absolute 1.3 (*)    All other components within normal limits  COMPREHENSIVE METABOLIC PANEL - Abnormal; Notable for the following:    AST 49 (*)    ALT 67 (*)    All other components within normal limits  LIPASE, BLOOD - Abnormal; Notable for the following:    Lipase 21 (*)    All other components within normal limits  URINALYSIS, ROUTINE W REFLEX MICROSCOPIC (NOT AT [facility])  POC URINE PREG, ED    Imaging Review Ct Abdomen Pelvis W Contrast  12/16/2014   CLINICAL DATA:  Acute onset of persistent lower abdominal discomfort and tightness, since cold knife conization. Initial encounter.  EXAM: CT ABDOMEN  AND PELVIS WITH CONTRAST  TECHNIQUE: Multidetector CT imaging of the abdomen and pelvis was performed using the standard protocol following bolus administration of intravenous contrast.  CONTRAST:  166mL OMNIPAQUE IOHEXOL 300 MG/ML  SOLN  COMPARISON:  CT of the abdomen and pelvis performed 06/25/2014, and MRI of the pelvis performed 12/03/2014. Abdominal ultrasound performed 11/19/2014  FINDINGS: The visualized lung bases are clear.  The liver and spleen are unremarkable in appearance. The patient is status post cholecystectomy, with clips noted at the gallbladder fossa. The pancreas and adrenal glands are unremarkable.  The kidneys are unremarkable in appearance. There is no evidence of hydronephrosis. No renal or ureteral stones are seen. No perinephric stranding is appreciated.  No free fluid is identified. The small bowel is unremarkable in appearance. The stomach is within normal limits. No acute vascular abnormalities are seen.  The patient is status post appendectomy. Mild diverticulosis is noted along the ascending, distal transverse and proximal descending colon, without evidence of diverticulitis.  The bladder is mildly distended and grossly unremarkable in appearance. Air about the cervix may reflect recent surgery. Scattered fibroids are seen within the uterus. Mild edema is noted about the right side of the cervix, tracking into the lower pelvis, possibly postoperative in nature.  A right ovarian mass has increased in size, measuring 6.2 x 6.1 x 5.1 cm. This demonstrated diffuse relatively homogeneous enhancement on the prior MRI. Given gradual increase in size, malignancy is a concern. No inguinal lymphadenopathy is seen.  No acute osseous abnormalities are identified.  IMPRESSION: 1. Right ovarian mass has increased in size, measuring 6.2 x 6.1 x 5.1 cm. This demonstrated diffuse relatively homogeneous enhancement on the prior MRI. Given gradual increase in size, malignancy is a concern. Would  recommend biopsy for further evaluation, as deemed clinically appropriate. 2. Mild edema noted about the right side of the cervix, tracking into the lower pelvis; this may be postoperative in nature. Scattered uterine fibroids again seen. No evidence of abscess at this time. 3. Mild diverticulosis along the ascending, distal transverse and proximal descending colon, without evidence of diverticulitis.  These results were called by telephone at the time of interpretation on 12/16/2014 at 11:36 pm to [employer] PA, who verbally acknowledged these results.   Electronically Signed   By: Garald Balding  M.D.   On: 12/16/2014 23:39     EKG Interpretation None      MDM   Final diagnoses:  Lower abdominal pain  Ovarian mass, right    BP 141/85 mmHg  Pulse 85  Temp(Src) 98.6 F (37 C) (Oral)  Resp 18  SpO2 100%  LMP 08/18/2014 (Approximate)  I have reviewed nursing notes and vital signs. I personally viewed the imaging tests through PACS system and agrees with radiologist's intepretation I reviewed available ER/hospitalization records through the EMR     Domenic Moras, PA-C 12/17/14 [place], MD 12/17/14 1321

## 2014-12-17 ENCOUNTER — Telehealth: Payer: Self-pay | Admitting: Gynecologic Oncology

## 2014-12-17 DIAGNOSIS — N72 Inflammatory disease of cervix uteri: Secondary | ICD-10-CM

## 2014-12-17 LAB — GC/CHLAMYDIA PROBE AMP (~~LOC~~) NOT AT ARMC
CHLAMYDIA, DNA PROBE: NEGATIVE
Neisseria Gonorrhea: NEGATIVE

## 2014-12-17 MED ORDER — PROMETHAZINE HCL 25 MG PO TABS: 25.0000 mg | ORAL_TABLET | Freq: Four times a day (QID) | ORAL | Status: DC | PRN

## 2014-12-17 MED ORDER — CIPROFLOXACIN HCL 250 MG PO TABS: 250.0000 mg | ORAL_TABLET | Freq: Two times a day (BID) | ORAL | Status: DC

## 2014-12-17 MED ORDER — METRONIDAZOLE 500 MG PO TABS: 500.0000 mg | ORAL_TABLET | Freq: Three times a day (TID) | ORAL | Status: DC

## 2014-12-17 MED ORDER — OXYCODONE-ACETAMINOPHEN 10-325 MG PO TABS: 1.0000 | ORAL_TABLET | ORAL | Status: DC | PRN

## 2014-12-17 NOTE — Telephone Encounter (Signed)
Called to follow-up from the patient's ER admission last night. She has signs of cervicitis on imaging and labs. Was not prescribed antibiotics. I will want to ensure that she is prescribed these.  I left a message to call me back and left my cell phone number [phone] with her.  [name], MD

## 2014-12-17 NOTE — Telephone Encounter (Signed)
Called patient to followup. She told me she had an episode of emesis after returning from the ER last night. Now having pain but no emesis. Continuing to pass flatus.  Suspect she has endometritis/cervicitis based on clinical picture and CT imaging results.  Will phone in antibiotics. Will phone in cipro and flagyl. Recommended she take her temperature every 4 hours or when feeling febrile.  I provided her with my contact cell number [phone] so that she can contact me if she begins feeling worse not better, or has a fever >100.4.  [name], MD

## 2014-12-17 NOTE — Discharge Instructions (Signed)
You have been evaluated for your abdominal pain.  No evidence of abscess in your abdomen but your right ovary is enlarge and will need to be closely monitor by your OBGYN.  Please call and follow up closely with Dr. Denman George tomorrow for further care.  Return to ER if your condition worsen or if you have other concerns.

## 2014-12-22 ENCOUNTER — Other Ambulatory Visit: Payer: Self-pay | Admitting: Gynecologic Oncology

## 2014-12-22 ENCOUNTER — Telehealth: Payer: Self-pay | Admitting: *Deleted

## 2014-12-22 DIAGNOSIS — R5082 Postprocedural fever: Secondary | ICD-10-CM

## 2014-12-22 DIAGNOSIS — R1030 Lower abdominal pain, unspecified: Secondary | ICD-10-CM

## 2014-12-22 MED ORDER — OXYCODONE-ACETAMINOPHEN 10-325 MG PO TABS: 1.0000 | ORAL_TABLET | ORAL | Status: DC | PRN

## 2014-12-22 NOTE — Progress Notes (Signed)
Patient called requesting refill on pain medication.  Given #20 Percocet 10/325 on 12/16/14.  Reporting moderate lower abdominal pain intermittently.  Refill given.  Reportable signs and symptoms reviewed.  Advised to call for worsening pain, fever, chills.

## 2014-12-22 NOTE — Telephone Encounter (Signed)
Dr. Denman George informed of patient's low grade fevers and continued abdominal pain. Patient scheduled for CT scan tomorrow (agreeable to arrive at 8:15am at [facility] to drink contrast there) and appt to see Dr Denman George on Thursday, 12/24/14, at 12pm. Told patient to continue checking her temperature every 4 hours at home and to alternate the percocet with ibuprofen. Told patient to please call our office with any questions or concerns prior to appt on 12/27/14 - patient agreeable to this and appreciative of call.

## 2014-12-23 ENCOUNTER — Ambulatory Visit (HOSPITAL_COMMUNITY)
Admission: RE | Admit: 2014-12-23 | Discharge: 2014-12-23 | Disposition: A | Discharge status: Home / Self Care | Payer: Medicare Other | Source: Ambulatory Visit | Attending: Gynecologic Oncology | Admitting: Gynecologic Oncology

## 2014-12-23 DIAGNOSIS — R1031 Right lower quadrant pain: Secondary | ICD-10-CM | POA: Insufficient documentation

## 2014-12-23 DIAGNOSIS — R5082 Postprocedural fever: Secondary | ICD-10-CM

## 2014-12-23 DIAGNOSIS — D259 Leiomyoma of uterus, unspecified: Secondary | ICD-10-CM | POA: Diagnosis not present

## 2014-12-23 DIAGNOSIS — R1030 Lower abdominal pain, unspecified: Secondary | ICD-10-CM

## 2014-12-23 DIAGNOSIS — I700 Atherosclerosis of aorta: Secondary | ICD-10-CM | POA: Diagnosis not present

## 2014-12-23 MED ORDER — IOHEXOL 300 MG/ML  SOLN
100.0000 mL | Freq: Once | INTRAMUSCULAR | Status: AC | PRN
  Administered 2014-12-23: 80 mL via INTRAVENOUS

## 2014-12-23 MED ORDER — IOHEXOL 300 MG/ML  SOLN
25.0000 mL | Freq: Once | INTRAMUSCULAR | Status: AC | PRN
  Administered 2014-12-23: 25 mL via ORAL

## 2014-12-24 ENCOUNTER — Encounter: Payer: Self-pay | Admitting: Gynecologic Oncology

## 2014-12-24 ENCOUNTER — Ambulatory Visit: Payer: Medicare Other | Attending: Gynecologic Oncology | Admitting: Gynecologic Oncology

## 2014-12-24 VITALS — BP 124/71 | HR 89 | Temp 98.3°F | Resp 20 | Ht 66.0 in | Wt 188.1 lb

## 2014-12-24 DIAGNOSIS — C539 Malignant neoplasm of cervix uteri, unspecified: Secondary | ICD-10-CM

## 2014-12-24 DIAGNOSIS — R1031 Right lower quadrant pain: Secondary | ICD-10-CM

## 2014-12-24 DIAGNOSIS — Z7189 Other specified counseling: Secondary | ICD-10-CM | POA: Diagnosis not present

## 2014-12-24 NOTE — Progress Notes (Signed)
POSTOPERATIVE FOLLOWUP Assessment:    39 y.o. year old with Stage IB1 cervical cancer.   S/p cold knife conization on 12/08/14. Now with symptoms concerning for fibroid degeneration or pelvic infection.  Plan: 1) Pathology reports reviewed today 2) Treatment counseling - I discussed with [name] that she still has malignancy present. While it was very small in dimensions on the cone, there was malignancy in the post cone ECC. Therefore I am recommending a type II modified radical hysterectomy with pelvic lymphadenectomy (LVSI was present).   I extensively reviewed the risks of infection, bleeding, damage to nearby organs including bowel, bladder, vessels, and ureters. We discussed postoperative risks including infection, PE/ DVT, changes and risk of bowel and bladder dysfunction and lymphedema. We also reviewed possible need for further treatment such as radiation or chemotherapy based on final pathology. I counseled her on need for foley catheter x 7-10 days and possible risk of prolonged catheterization if unable to void.  I discussed that we will attempt the radical hysterectomy robotically, however, if we are unable to complete it through this approach secondary to fibroids, she may require a laparotomy. We may need to perform intraoperative myomectomy to facilitate the surgery.  She was given the opportunity to ask questions, which were answered to her satisfaction, and she is agreement with the above mentioned plan of care.  With respect to her symptoms, I believe these are in part due to pelvic inflammation after cone, however, they are more likely secondary to a degenerating fibroid (based on the CT findings) and the fact that these were present pre-cone. I recommended continuing ibuprofen, and continuing her course of antibiotics.  3)  Return for surgery on 7/19.  HPI:  Melissa Alvarez is a 39 y.o. year old G2P2000 initially seen in consultation on 11/24/14 for cervical cancer.  She then  underwent a cold knife conization on 8/0/99 without complications.  Her postoperative course was complicated by a trip to the ER for severe pain post cone. CT imaging showed no abscess or abnormality. Her white count was mildly elevated but she was afebrile. She was discharged from the ER and I prescribed a course of doxy and flagl for presumed cervicitis. She continues to have pain postop op and so I repeated her CT imaging on 12/23/14 and this showed slightly more pelvic fluid, but no abscess. She has multiple fibroids, one of which appears to be degenerating.   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 There were multiple foci all less than 66mm. LVSI was present. 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 The post cone endocervical curretting was positive for fragments of squamous cell carcinoma.  She is seen today for a postoperative check and to discuss her pathology results and ongoing plan.  Since discharge from the hospital, she is feeling continue pain. She reports that this pain was present preoperative, and she was having RLQ pains for several months and that Dr Ihor Dow thought this might be from the pedunculated fibroid twisting.  She has improving appetite, normal bowel and bladder function, and pain controlled with minimal PO medication. She has no other complaints today.    Review of systems: Constitutional:  She has no weight gain or weight loss. She has no fever or chills. Eyes: No blurred vision Ears, Nose, Mouth, Throat: No dizziness, headaches or changes in hearing. No mouth sores. Cardiovascular: No chest pain, palpitations or edema. Respiratory:  No  shortness of breath, wheezing or cough Gastrointestinal: She has normal bowel movements without diarrhea or constipation. She denies any nausea or vomiting. She denies blood in her stool or heart burn. Genitourinary:  She denies  pelvic pain, pelvic pressure or changes in her urinary function. She has no hematuria, dysuria, or incontinence. She has no irregular vaginal bleeding or vaginal discharge Musculoskeletal: Denies muscle weakness or joint pains.  Skin:  She has no skin changes, rashes or itching Neurological:  Denies dizziness or headaches. No neuropathy, no numbness or tingling. Psychiatric:  She denies depression or anxiety. Hematologic/Lymphatic:   No easy bruising or bleeding   Physical Exam: Blood pressure 124/71, pulse 89, temperature 98.3 F (36.8 C), temperature source Oral, resp. rate 20, height 5\' 6"  (1.676 m), weight 188 lb 1.6 oz (85.322 kg), last menstrual period 09/01/2014. General: Well dressed, well nourished in no apparent distress.   HEENT:  Normocephalic and atraumatic, no lesions.  Extraocular muscles intact. Sclerae anicteric. Pupils equal, round, reactive. No mouth sores or  Abdomen:  Soft, nontender, nondistended.  No palpable masses.  No hepatosplenomegaly.  No ascites. Normal bowel sounds.  No hernias.   Genitourinary: Normal EGBUS  Cervix healing normally from cone. No gross tumor visible. No infection, bleeding or discharge. Significant tenderness on bimanual examination. Extremities: No cyanosis, clubbing or edema.  No calf tenderness or erythema. No palpable cords. Psychiatric: Mood and affect are appropriate. Neurological: Awake, alert and oriented x 3. Sensation is intact, no neuropathy.  Musculoskeletal: No pain, normal strength and range of motion.  [name], MD

## 2014-12-24 NOTE — Patient Instructions (Signed)
Preparing for your Surgery  Plan for surgery on July 19 with Dr. Denman George.  Pre-operative Testing -You will receive a phone call from presurgical testing at [facility] to arrange for a pre-operative testing appointment before your surgery.  This appointment normally occurs one to two weeks before your scheduled surgery.   -Bring your insurance card, copy of an advanced directive if applicable, medication list  -At that visit, you will be asked to sign a consent for a possible blood transfusion in case a transfusion becomes necessary during surgery.  The need for a blood transfusion is rare but having consent is a necessary part of your care.     -You should not be taking blood thinners or aspirin at least ten days prior to surgery unless instructed by your surgeon.  Day Before Surgery at [facility] will be asked to take in only clear liquids the day before surgery.  Examples of clear liquids include broths, jello, and clear juices.  You will be advised to have nothing to eat or drink after midnight the evening before.    Your role in recovery Your role is to become active as soon as directed by your doctor, while still giving yourself time to heal.  Rest when you feel tired. You will be asked to do the following in order to speed your recovery:  - Cough and breathe deeply. This helps toclear and expand your lungs and can prevent pneumonia. You may be given a spirometer to practice deep breathing. A staff member will show you how to use the spirometer. - Do mild physical activity. Walking or moving your legs help your circulation and body functions return to normal. A staff member will help you when you try to walk and will provide you with simple exercises. Do not try to get up or walk alone the first time. - Actively manage your pain. Managing your pain lets you move in comfort. We will ask you to rate your pain on a scale of zero to 10. It is your responsibility to tell  your doctor or nurse where and how much you hurt so your pain can be treated.  Special Considerations -If you are diabetic, you may be placed on insulin after surgery to have closer control over your blood sugars to promote healing and recovery.  This does not mean that you will be discharged on insulin.  If applicable, your oral antidiabetics will be resumed when you are tolerating a solid diet.  -Your final pathology results from surgery should be available by the Friday after surgery and the results will be relayed to you when available.  Blood Transfusion Information WHAT IS A BLOOD TRANSFUSION? A transfusion is the replacement of blood or some of its parts. Blood is made up of multiple cells which provide different functions.  Red blood cells carry oxygen and are used for blood loss replacement.  White blood cells fight against infection.  Platelets control bleeding.  Plasma helps clot blood.  Other blood products are available for specialized needs, such as hemophilia or other clotting disorders. BEFORE THE TRANSFUSION  Who gives blood for transfusions?   You may be able to donate blood to be used at a later date on yourself (autologous donation).  Relatives can be asked to donate blood. This is generally not any safer than if you have received blood from a stranger. The same precautions are taken to ensure safety when a relative's blood is donated.  Healthy volunteers who are fully  evaluated to make sure their blood is safe. This is blood bank blood. Transfusion therapy is the safest it has ever been in the practice of medicine. Before blood is taken from a donor, a complete history is taken to make sure that person has no history of diseases nor engages in risky social behavior (examples are intravenous drug use or sexual activity with multiple partners). The donor's travel history is screened to minimize risk of transmitting infections, such as malaria. The donated blood is  tested for signs of infectious diseases, such as HIV and hepatitis. The blood is then tested to be sure it is compatible with you in order to minimize the chance of a transfusion reaction. If you or a relative donates blood, this is often done in anticipation of surgery and is not appropriate for emergency situations. It takes many days to process the donated blood. RISKS AND COMPLICATIONS Although transfusion therapy is very safe and saves many lives, the main dangers of transfusion include:   Getting an infectious disease.  Developing a transfusion reaction. This is an allergic reaction to something in the blood you were given. Every precaution is taken to prevent this. The decision to have a blood transfusion has been considered carefully by your caregiver before blood is given. Blood is not given unless the benefits outweigh the risks.

## 2014-12-28 ENCOUNTER — Ambulatory Visit: Payer: Medicare Other | Admitting: Gynecologic Oncology

## 2014-12-30 ENCOUNTER — Telehealth: Payer: Self-pay | Admitting: *Deleted

## 2014-12-30 NOTE — Telephone Encounter (Signed)
Received call from patient stating that her right sided abdominal pain has worsened since seeing Dr. Denman George on 6/23 and her right thigh is slightly swollen and painful. She is inquiring if she needs to go to the emergency room for further evaluation. Told patient that Dr. Denman George will be notified and we will call her back with recommendation.   Information relayed to Dr. Denman George via email - she feels that this pain is probably related to her degenerating fibriod and that doppler may be warrented to r/o blood clot.  Attempted to reach patient several times via phone and left 2 voicemails for patient. Patient's emergency contact in EPIC has same contact number as patient and there are no alternate phone numbers to reach patient.

## 2014-12-31 ENCOUNTER — Other Ambulatory Visit: Payer: Self-pay | Admitting: Gynecologic Oncology

## 2014-12-31 ENCOUNTER — Ambulatory Visit (HOSPITAL_COMMUNITY): Admission: RE | Admit: 2014-12-31 | Payer: Medicare Other | Source: Ambulatory Visit

## 2014-12-31 ENCOUNTER — Telehealth: Payer: Self-pay | Admitting: *Deleted

## 2014-12-31 DIAGNOSIS — R600 Localized edema: Secondary | ICD-10-CM

## 2014-12-31 NOTE — Progress Notes (Signed)
See RN note.  Doppler ordered per Dr. Denman George for right lower extremity (upper thigh) edema post conization to rule to DVT.

## 2014-12-31 NOTE — Telephone Encounter (Signed)
Called to check on patient's status this morning since I was unable to reach her by phone yesterday afternoon. Patient states that the contact number is her daughter's phone and that she does not have her own cell phone. Patient states that her right thigh is still swollen this morning and she would like to proceed with doppler study to rule out blood clot. Doppler scheduled for today at 1pm at [facility] - patient agreeable to appt.

## 2015-01-06 ENCOUNTER — Other Ambulatory Visit: Payer: Self-pay | Admitting: Gynecologic Oncology

## 2015-01-06 DIAGNOSIS — R1030 Lower abdominal pain, unspecified: Secondary | ICD-10-CM

## 2015-01-06 MED ORDER — OXYCODONE-ACETAMINOPHEN 10-325 MG PO TABS: 1.0000 | ORAL_TABLET | ORAL | Status: DC | PRN

## 2015-01-06 NOTE — Progress Notes (Signed)
Patient called reporting continued lower abdominal pain and asking whether she should go the to ER to get pain medication or would we refill her medication.  Informed the pain is most likely from a degenerating fibroid per Dr. Denman George.  Refill will be given one time before her surgery on July 19.  Non-pharmacologic measures also discussed including heating pad etc.

## 2015-01-08 ENCOUNTER — Ambulatory Visit: Payer: Medicare Other | Admitting: Gynecologic Oncology

## 2015-01-11 ENCOUNTER — Ambulatory Visit: Payer: Medicare Other | Admitting: Gynecologic Oncology

## 2015-01-14 ENCOUNTER — Encounter (HOSPITAL_COMMUNITY): Payer: Self-pay

## 2015-01-14 ENCOUNTER — Other Ambulatory Visit: Payer: Self-pay

## 2015-01-14 ENCOUNTER — Encounter (HOSPITAL_COMMUNITY)
Admission: RE | Admit: 2015-01-14 | Discharge: 2015-01-14 | Disposition: A | Discharge status: Home / Self Care | Payer: Medicare Other | Source: Ambulatory Visit | Attending: Gynecologic Oncology | Admitting: Gynecologic Oncology

## 2015-01-14 DIAGNOSIS — C539 Malignant neoplasm of cervix uteri, unspecified: Secondary | ICD-10-CM | POA: Diagnosis not present

## 2015-01-14 DIAGNOSIS — R9431 Abnormal electrocardiogram [ECG] [EKG]: Secondary | ICD-10-CM | POA: Insufficient documentation

## 2015-01-14 DIAGNOSIS — Z0183 Encounter for blood typing: Secondary | ICD-10-CM | POA: Diagnosis not present

## 2015-01-14 DIAGNOSIS — Z01812 Encounter for preprocedural laboratory examination: Secondary | ICD-10-CM | POA: Diagnosis not present

## 2015-01-14 HISTORY — DX: Paresthesia of skin: R20.2

## 2015-01-14 HISTORY — DX: Anesthesia of skin: R20.0

## 2015-01-14 HISTORY — DX: Reserved for inherently not codable concepts without codable children: IMO0001

## 2015-01-14 LAB — CBC WITH DIFFERENTIAL/PLATELET
Basophils Absolute: 0 10*3/uL (ref 0.0–0.1)
Basophils Relative: 0 % (ref 0–1)
Eosinophils Absolute: 0.1 10*3/uL (ref 0.0–0.7)
Eosinophils Relative: 1 % (ref 0–5)
HEMATOCRIT: 33.1 % — AB (ref 36.0–46.0)
Hemoglobin: 9.4 g/dL — ABNORMAL LOW (ref 12.0–15.0)
LYMPHS PCT: 23 % (ref 12–46)
Lymphs Abs: 2.5 10*3/uL (ref 0.7–4.0)
MCH: 19.1 pg — ABNORMAL LOW (ref 26.0–34.0)
MCHC: 28.4 g/dL — AB (ref 30.0–36.0)
MCV: 67.1 fL — ABNORMAL LOW (ref 78.0–100.0)
Monocytes Absolute: 0.9 10*3/uL (ref 0.1–1.0)
Monocytes Relative: 8 % (ref 3–12)
Neutro Abs: 7.3 10*3/uL (ref 1.7–7.7)
Neutrophils Relative %: 68 % (ref 43–77)
PLATELETS: 232 10*3/uL (ref 150–400)
RBC: 4.93 MIL/uL (ref 3.87–5.11)
RDW: 21.3 % — AB (ref 11.5–15.5)
WBC: 10.8 10*3/uL — ABNORMAL HIGH (ref 4.0–10.5)

## 2015-01-14 LAB — URINE MICROSCOPIC-ADD ON

## 2015-01-14 LAB — COMPREHENSIVE METABOLIC PANEL
ALBUMIN: 3.8 g/dL (ref 3.5–5.0)
ALK PHOS: 57 U/L (ref 38–126)
ALT: 19 U/L (ref 14–54)
ANION GAP: 7 (ref 5–15)
AST: 23 U/L (ref 15–41)
BILIRUBIN TOTAL: 0.2 mg/dL — AB (ref 0.3–1.2)
BUN: 8 mg/dL (ref 6–20)
CALCIUM: 8.6 mg/dL — AB (ref 8.9–10.3)
CO2: 21 mmol/L — AB (ref 22–32)
Chloride: 109 mmol/L (ref 101–111)
Creatinine, Ser: 0.68 mg/dL (ref 0.44–1.00)
GFR calc Af Amer: >60 mL/min (ref >60)
Glucose, Bld: 134 mg/dL — ABNORMAL HIGH (ref 65–99)
Potassium: 3.7 mmol/L (ref 3.5–5.1)
Sodium: 137 mmol/L (ref 135–145)
Total Protein: 7.1 g/dL (ref 6.5–8.1)

## 2015-01-14 LAB — URINALYSIS, ROUTINE W REFLEX MICROSCOPIC
Bilirubin Urine: NEGATIVE
Glucose, UA: NEGATIVE mg/dL
Ketones, ur: NEGATIVE mg/dL
LEUKOCYTES UA: NEGATIVE
NITRITE: NEGATIVE
Protein, ur: NEGATIVE mg/dL
SPECIFIC GRAVITY, URINE: 1.023 (ref 1.005–1.030)
UROBILINOGEN UA: 0.2 mg/dL (ref 0.0–1.0)
pH: 6 (ref 5.0–8.0)

## 2015-01-14 LAB — PREGNANCY, URINE: PREG TEST UR: NEGATIVE

## 2015-01-14 NOTE — Progress Notes (Signed)
CT abd/pelvis w contrast in epic 12/23/2014 (discusses lungs)

## 2015-01-14 NOTE — Progress Notes (Addendum)
Pt was due to have doppler preformed per right thigh 12/31/2014 per telephone note/epic 12/31/2014  with question blood clot but pt has currently not had preformed. Contacted Dr Denman George in regards to situation spoke with [facility] NP.

## 2015-01-14 NOTE — Patient Instructions (Signed)
Melissa Alvarez  01/14/2015   Your procedure is scheduled on: Tuesday January 19, 2015   Report to [facility] Main  Entrance take [place]  elevators to 3rd floor to  [place] at 5:00 AM.  Call this number if you have problems the morning of surgery [phone]   Remember: ONLY 1 PERSON MAY GO WITH YOU TO SHORT STAY TO GET  READY MORNING OF [name].  Do not eat food or drink liquids :After Midnight.     Take these medicines the morning of surgery: May use Albuterol Inhaler if needed                                You may not have any metal on your body including hair pins and              piercings  Do not wear jewelry, make-up, lotions, powders or perfumes, deodorant             Do not wear nail polish.  Do not shave  48 hours prior to surgery.                Do not bring valuables to the hospital. [place].  Contacts, dentures or bridgework may not be worn into surgery.  Leave suitcase in the car. After surgery it may be brought to your room.                  Please read over the following fact sheets you were given:INCENTIVE SPIROMETER; BLOOD TRANSFUSION  _____________________________________________________________________             [facility] - Preparing for Surgery Before surgery, you can play an important role.  Because skin is not sterile, your skin needs to be as free of germs as possible.  You can reduce the number of germs on your skin by washing with CHG (chlorahexidine gluconate) soap before surgery.  CHG is an antiseptic cleaner which kills germs and bonds with the skin to continue killing germs even after washing. Please DO NOT use if you have an allergy to CHG or antibacterial soaps.  If your skin becomes reddened/irritated stop using the CHG and inform your nurse when you arrive at Short Stay. Do not shave (including legs and underarms) for at least 48 hours prior to the first CHG  shower.  You may shave your face/neck. Please follow these instructions carefully:  1.  Shower with CHG Soap the night before surgery and the  morning of Surgery.  2.  If you choose to wash your hair, wash your hair first as usual with your  normal  shampoo.  3.  After you shampoo, rinse your hair and body thoroughly to remove the  shampoo.                           4.  Use CHG as you would any other liquid soap.  You can apply chg directly  to the skin and wash                       Gently with a scrungie or clean washcloth.  5.  Apply the CHG Soap  to your body ONLY FROM THE NECK DOWN.   Do not use on face/ open                           Wound or open sores. Avoid contact with eyes, ears mouth and genitals (private parts).                       Wash face,  Genitals (private parts) with your normal soap.             6.  Wash thoroughly, paying special attention to the area where your surgery  will be performed.  7.  Thoroughly rinse your body with warm water from the neck down.  8.  DO NOT shower/wash with your normal soap after using and rinsing off  the CHG Soap.                9.  Pat yourself dry with a clean towel.            10.  Wear clean pajamas.            11.  Place clean sheets on your bed the night of your first shower and do not  sleep with pets. Day of Surgery : Do not apply any lotions/deodorants the morning of surgery.  Please wear clean clothes to the hospital/surgery center.  FAILURE TO FOLLOW THESE INSTRUCTIONS MAY RESULT IN THE CANCELLATION OF YOUR SURGERY PATIENT SIGNATURE_________________________________  NURSE SIGNATURE__________________________________  ________________________________________________________________________   [name]  An incentive spirometer is a tool that can help keep your lungs clear and active. This tool measures how well you are filling your lungs with each breath. Taking long deep breaths may help reverse or decrease the chance  of developing breathing (pulmonary) problems (especially infection) following:  A long period of time when you are unable to move or be active. BEFORE THE PROCEDURE   If the spirometer includes an indicator to show your best effort, your nurse or respiratory therapist will set it to a desired goal.  If possible, sit up straight or lean slightly forward. Try not to slouch.  Hold the incentive spirometer in an upright position. INSTRUCTIONS FOR USE   Sit on the edge of your bed if possible, or sit up as far as you can in bed or on a chair.  Hold the incentive spirometer in an upright position.  Breathe out normally.  Place the mouthpiece in your mouth and seal your lips tightly around it.  Breathe in slowly and as deeply as possible, raising the piston or the ball toward the top of the column.  Hold your breath for 3-5 seconds or for as long as possible. Allow the piston or ball to fall to the bottom of the column.  Remove the mouthpiece from your mouth and breathe out normally.  Rest for a few seconds and repeat Steps 1 through 7 at least 10 times every 1-2 hours when you are awake. Take your time and take a few normal breaths between deep breaths.  The spirometer may include an indicator to show your best effort. Use the indicator as a goal to work toward during each repetition.  After each set of 10 deep breaths, practice coughing to be sure your lungs are clear. If you have an incision (the cut made at the time of surgery), support your incision when coughing by placing a pillow or rolled up towels firmly  against it. Once you are able to get out of bed, walk around indoors and cough well. You may stop using the incentive spirometer when instructed by your caregiver.  RISKS AND COMPLICATIONS  Take your time so you do not get dizzy or light-headed.  If you are in pain, you may need to take or ask for pain medication before doing incentive spirometry. It is harder to take a deep  breath if you are having pain. AFTER USE  Rest and breathe slowly and easily.  It can be helpful to keep track of a log of your progress. Your caregiver can provide you with a simple table to help with this. If you are using the spirometer at home, follow these instructions: [place] IF:   You are having difficultly using the spirometer.  You have trouble using the spirometer as often as instructed.  Your pain medication is not giving enough relief while using the spirometer.  You develop fever of 100.5 F (38.1 C) or higher. SEEK IMMEDIATE MEDICAL CARE IF:   You cough up bloody sputum that had not been present before.  You develop fever of 102 F (38.9 C) or greater.  You develop worsening pain at or near the incision site. MAKE SURE YOU:   Understand these instructions.  Will watch your condition.  Will get help right away if you are not doing well or get worse. Document Released: 10/30/2006 Document Revised: 09/11/2011 Document Reviewed: 12/31/2006 Exit[employer] Patient Information 2014 Exit[employer], [place].   ________________________________________________________________________  WHAT IS A BLOOD TRANSFUSION? Blood Transfusion Information  A transfusion is the replacement of blood or some of its parts. Blood is made up of multiple cells which provide different functions.  Red blood cells carry oxygen and are used for blood loss replacement.  White blood cells fight against infection.  Platelets control bleeding.  Plasma helps clot blood.  Other blood products are available for specialized needs, such as hemophilia or other clotting disorders. BEFORE THE TRANSFUSION  Who gives blood for transfusions?   Healthy volunteers who are fully evaluated to make sure their blood is safe. This is blood bank blood. Transfusion therapy is the safest it has ever been in the practice of medicine. Before blood is taken from a donor, a complete history is taken to make sure  that person has no history of diseases nor engages in risky social behavior (examples are intravenous drug use or sexual activity with multiple partners). The donor's travel history is screened to minimize risk of transmitting infections, such as malaria. The donated blood is tested for signs of infectious diseases, such as HIV and hepatitis. The blood is then tested to be sure it is compatible with you in order to minimize the chance of a transfusion reaction. If you or a relative donates blood, this is often done in anticipation of surgery and is not appropriate for emergency situations. It takes many days to process the donated blood. RISKS AND COMPLICATIONS Although transfusion therapy is very safe and saves many lives, the main dangers of transfusion include:   Getting an infectious disease.  Developing a transfusion reaction. This is an allergic reaction to something in the blood you were given. Every precaution is taken to prevent this. The decision to have a blood transfusion has been considered carefully by your caregiver before blood is given. Blood is not given unless the benefits outweigh the risks. AFTER THE TRANSFUSION  Right after receiving a blood transfusion, you will usually feel much better and  more energetic. This is especially true if your red blood cells have gotten low (anemic). The transfusion raises the level of the red blood cells which carry oxygen, and this usually causes an energy increase.  The nurse administering the transfusion will monitor you carefully for complications. HOME CARE INSTRUCTIONS  No special instructions are needed after a transfusion. You may find your energy is better. Speak with your caregiver about any limitations on activity for underlying diseases you may have. SEEK MEDICAL CARE IF:   Your condition is not improving after your transfusion.  You develop redness or irritation at the intravenous (IV) site. SEEK IMMEDIATE MEDICAL CARE IF:  Any of  the following symptoms occur over the next 12 hours:  Shaking chills.  You have a temperature by mouth above 102 F (38.9 C), not controlled by medicine.  Chest, back, or muscle pain.  People around you feel you are not acting correctly or are confused.  Shortness of breath or difficulty breathing.  Dizziness and fainting.  You get a rash or develop hives.  You have a decrease in urine output.  Your urine turns a dark color or changes to pink, red, or brown. Any of the following symptoms occur over the next 10 days:  You have a temperature by mouth above 102 F (38.9 C), not controlled by medicine.  Shortness of breath.  Weakness after normal activity.  The white part of the eye turns yellow (jaundice).  You have a decrease in the amount of urine or are urinating less often.  Your urine turns a dark color or changes to pink, red, or brown. Document Released: 06/16/2000 Document Revised: 09/11/2011 Document Reviewed: 02/03/2008 [facility] Patient Information 2014 [place], [place].  _______________________________________________________________________

## 2015-01-14 NOTE — Progress Notes (Signed)
CBCD results per epic per PAT visit 01/14/2015 sent to Dr Denman George

## 2015-01-19 ENCOUNTER — Encounter (HOSPITAL_COMMUNITY)
Admission: RE | Disposition: A | Discharge status: Home / Self Care | Payer: Self-pay | Source: Ambulatory Visit | Attending: Gynecologic Oncology

## 2015-01-19 ENCOUNTER — Encounter (HOSPITAL_COMMUNITY): Payer: Self-pay | Admitting: *Deleted

## 2015-01-19 ENCOUNTER — Inpatient Hospital Stay (HOSPITAL_COMMUNITY): Payer: Medicare Other | Admitting: Anesthesiology

## 2015-01-19 ENCOUNTER — Inpatient Hospital Stay (HOSPITAL_COMMUNITY)
Admission: RE | Admit: 2015-01-19 | Discharge: 2015-01-20 | DRG: 735 | Disposition: A | Discharge status: Home / Self Care | Payer: Medicare Other | Source: Ambulatory Visit | Attending: Gynecologic Oncology | Admitting: Gynecologic Oncology

## 2015-01-19 DIAGNOSIS — D270 Benign neoplasm of right ovary: Secondary | ICD-10-CM

## 2015-01-19 DIAGNOSIS — R1030 Lower abdominal pain, unspecified: Secondary | ICD-10-CM

## 2015-01-19 DIAGNOSIS — J449 Chronic obstructive pulmonary disease, unspecified: Secondary | ICD-10-CM | POA: Diagnosis not present

## 2015-01-19 DIAGNOSIS — D069 Carcinoma in situ of cervix, unspecified: Secondary | ICD-10-CM | POA: Diagnosis not present

## 2015-01-19 DIAGNOSIS — C539 Malignant neoplasm of cervix uteri, unspecified: Secondary | ICD-10-CM | POA: Diagnosis not present

## 2015-01-19 DIAGNOSIS — D259 Leiomyoma of uterus, unspecified: Secondary | ICD-10-CM

## 2015-01-19 HISTORY — PX: ROBOTIC ASSISTED TOTAL HYSTERECTOMY: SHX6085

## 2015-01-19 HISTORY — PX: PELVIC LYMPH NODE DISSECTION: SHX6543

## 2015-01-19 LAB — TYPE AND SCREEN
ABO/RH(D): O POS
Antibody Screen: NEGATIVE

## 2015-01-19 SURGERY — ROBOTIC ASSISTED TOTAL HYSTERECTOMY
Anesthesia: General

## 2015-01-19 MED ORDER — MIDAZOLAM HCL 2 MG/2ML IJ SOLN
INTRAMUSCULAR | Status: AC
Start: 2015-01-19 — End: 2015-01-19
  Filled 2015-01-19: qty 2

## 2015-01-19 MED ORDER — PROPOFOL 10 MG/ML IV BOLUS
INTRAVENOUS | Status: AC
  Filled 2015-01-19: qty 20

## 2015-01-19 MED ORDER — ONDANSETRON HCL 4 MG/2ML IJ SOLN: 4.0000 mg | Freq: Four times a day (QID) | INTRAMUSCULAR | Status: DC | PRN

## 2015-01-19 MED ORDER — MIDAZOLAM HCL 5 MG/5ML IJ SOLN
INTRAMUSCULAR | Status: DC | PRN
  Administered 2015-01-19: 1 mg via INTRAVENOUS

## 2015-01-19 MED ORDER — ENOXAPARIN SODIUM 40 MG/0.4ML ~~LOC~~ SOLN
40.0000 mg | SUBCUTANEOUS | Status: DC
  Administered 2015-01-20: 40 mg via SUBCUTANEOUS
  Filled 2015-01-19 (×3): qty 0.4

## 2015-01-19 MED ORDER — KCL IN DEXTROSE-NACL 20-5-0.45 MEQ/L-%-% IV SOLN
INTRAVENOUS | Status: DC
  Administered 2015-01-19: 15:00:00 via INTRAVENOUS
  Filled 2015-01-19 (×2): qty 1000

## 2015-01-19 MED ORDER — NEOSTIGMINE METHYLSULFATE 10 MG/10ML IV SOLN
INTRAVENOUS | Status: DC | PRN
  Administered 2015-01-19: 4 mg via INTRAVENOUS

## 2015-01-19 MED ORDER — ROCURONIUM BROMIDE 100 MG/10ML IV SOLN
INTRAVENOUS | Status: DC | PRN
  Administered 2015-01-19 (×2): 10 mg via INTRAVENOUS
  Administered 2015-01-19: 40 mg via INTRAVENOUS
  Administered 2015-01-19 (×2): 10 mg via INTRAVENOUS

## 2015-01-19 MED ORDER — GABAPENTIN 600 MG PO TABS: 600.0000 mg | ORAL_TABLET | Freq: Once | ORAL | Status: DC

## 2015-01-19 MED ORDER — CIPROFLOXACIN IN D5W 400 MG/200ML IV SOLN
INTRAVENOUS | Status: AC
  Filled 2015-01-19: qty 200

## 2015-01-19 MED ORDER — LIDOCAINE HCL (CARDIAC) 20 MG/ML IV SOLN
INTRAVENOUS | Status: AC
  Filled 2015-01-19: qty 5

## 2015-01-19 MED ORDER — GLYCOPYRROLATE 0.2 MG/ML IJ SOLN
INTRAMUSCULAR | Status: AC
  Filled 2015-01-19: qty 3

## 2015-01-19 MED ORDER — HYDROMORPHONE HCL 1 MG/ML IJ SOLN
0.2000 mg | INTRAMUSCULAR | Status: DC | PRN
  Administered 2015-01-19 – 2015-01-20 (×4): 0.6 mg via INTRAVENOUS
  Filled 2015-01-19 (×4): qty 1

## 2015-01-19 MED ORDER — HYDROMORPHONE HCL 2 MG/ML IJ SOLN
INTRAMUSCULAR | Status: AC
  Filled 2015-01-19: qty 1

## 2015-01-19 MED ORDER — TRAMADOL HCL 50 MG PO TABS
100.0000 mg | ORAL_TABLET | Freq: Four times a day (QID) | ORAL | Status: DC | PRN
  Filled 2015-01-19: qty 2

## 2015-01-19 MED ORDER — OXYCODONE HCL 5 MG PO TABS
5.0000 mg | ORAL_TABLET | ORAL | Status: DC | PRN
  Administered 2015-01-19 – 2015-01-20 (×3): 5 mg via ORAL
  Filled 2015-01-19 (×3): qty 1

## 2015-01-19 MED ORDER — PROPOFOL 10 MG/ML IV BOLUS
INTRAVENOUS | Status: DC | PRN
  Administered 2015-01-19: 100 mg via INTRAVENOUS
  Administered 2015-01-19 (×3): 20 mg via INTRAVENOUS
  Administered 2015-01-19: 30 mg via INTRAVENOUS
  Administered 2015-01-19: 150 mg via INTRAVENOUS

## 2015-01-19 MED ORDER — PNEUMOCOCCAL VAC POLYVALENT 25 MCG/0.5ML IJ INJ
0.5000 mL | INJECTION | INTRAMUSCULAR | Status: DC
  Filled 2015-01-19 (×2): qty 0.5

## 2015-01-19 MED ORDER — ONDANSETRON HCL 4 MG/2ML IJ SOLN
INTRAMUSCULAR | Status: AC
  Filled 2015-01-19: qty 2

## 2015-01-19 MED ORDER — SODIUM CHLORIDE 0.9 % IJ SOLN
INTRAMUSCULAR | Status: AC
  Filled 2015-01-19: qty 10

## 2015-01-19 MED ORDER — LIDOCAINE HCL (CARDIAC) 20 MG/ML IV SOLN
INTRAVENOUS | Status: DC | PRN
  Administered 2015-01-19: 90 mg via INTRAVENOUS

## 2015-01-19 MED ORDER — ONDANSETRON HCL 4 MG PO TABS: 4.0000 mg | ORAL_TABLET | Freq: Four times a day (QID) | ORAL | Status: DC | PRN

## 2015-01-19 MED ORDER — GABAPENTIN 300 MG PO CAPS
600.0000 mg | ORAL_CAPSULE | Freq: Once | ORAL | Status: AC
  Administered 2015-01-19: 600 mg via ORAL
  Filled 2015-01-19 (×2): qty 2

## 2015-01-19 MED ORDER — FENTANYL CITRATE (PF) 100 MCG/2ML IJ SOLN
INTRAMUSCULAR | Status: AC
  Filled 2015-01-19: qty 2

## 2015-01-19 MED ORDER — SUFENTANIL CITRATE 50 MCG/ML IV SOLN
INTRAVENOUS | Status: DC | PRN
  Administered 2015-01-19: 5 ug via INTRAVENOUS
  Administered 2015-01-19: 10 ug via INTRAVENOUS
  Administered 2015-01-19: 5 ug via INTRAVENOUS
  Administered 2015-01-19: 10 ug via INTRAVENOUS
  Administered 2015-01-19 (×4): 5 ug via INTRAVENOUS

## 2015-01-19 MED ORDER — LACTATED RINGERS IV SOLN
INTRAVENOUS | Status: DC | PRN
  Administered 2015-01-19 (×2): via INTRAVENOUS

## 2015-01-19 MED ORDER — CLINDAMYCIN PHOSPHATE 900 MG/50ML IV SOLN
900.0000 mg | INTRAVENOUS | Status: AC
  Administered 2015-01-19: 900 mg via INTRAVENOUS

## 2015-01-19 MED ORDER — HYDROMORPHONE HCL 1 MG/ML IJ SOLN
INTRAMUSCULAR | Status: DC | PRN
  Administered 2015-01-19: .5 mg via INTRAVENOUS
  Administered 2015-01-19: 1 mg via INTRAVENOUS
  Administered 2015-01-19: .5 mg via INTRAVENOUS
  Administered 2015-01-19: 1 mg via INTRAVENOUS
  Administered 2015-01-19: .5 mg via INTRAVENOUS
  Administered 2015-01-19: 0.5 mg via INTRAVENOUS

## 2015-01-19 MED ORDER — SUFENTANIL CITRATE 50 MCG/ML IV SOLN
INTRAVENOUS | Status: AC
  Filled 2015-01-19: qty 1

## 2015-01-19 MED ORDER — LACTATED RINGERS IV SOLN
INTRAVENOUS | Status: DC
  Administered 2015-01-19: 13:00:00 via INTRAVENOUS

## 2015-01-19 MED ORDER — LACTATED RINGERS IV SOLN
INTRAVENOUS | Status: DC | PRN
  Administered 2015-01-19: 1000 mL

## 2015-01-19 MED ORDER — FENTANYL CITRATE (PF) 100 MCG/2ML IJ SOLN
25.0000 ug | INTRAMUSCULAR | Status: DC | PRN
  Administered 2015-01-19 (×2): 25 ug via INTRAVENOUS

## 2015-01-19 MED ORDER — CIPROFLOXACIN IN D5W 400 MG/200ML IV SOLN
400.0000 mg | INTRAVENOUS | Status: AC
  Administered 2015-01-19: 400 mg via INTRAVENOUS

## 2015-01-19 MED ORDER — ROCURONIUM BROMIDE 100 MG/10ML IV SOLN
INTRAVENOUS | Status: AC
  Filled 2015-01-19: qty 1

## 2015-01-19 MED ORDER — GLYCOPYRROLATE 0.2 MG/ML IJ SOLN
INTRAMUSCULAR | Status: DC | PRN
  Administered 2015-01-19: .6 mg via INTRAVENOUS

## 2015-01-19 MED ORDER — ONDANSETRON HCL 4 MG/2ML IJ SOLN
INTRAMUSCULAR | Status: DC | PRN
  Administered 2015-01-19: 4 mg via INTRAVENOUS

## 2015-01-19 MED ORDER — NEOSTIGMINE METHYLSULFATE 10 MG/10ML IV SOLN
INTRAVENOUS | Status: AC
  Filled 2015-01-19: qty 1

## 2015-01-19 MED ORDER — CLINDAMYCIN PHOSPHATE 900 MG/50ML IV SOLN
INTRAVENOUS | Status: AC
  Filled 2015-01-19: qty 50

## 2015-01-19 MED ORDER — HYDROMORPHONE HCL 1 MG/ML IJ SOLN: 0.2500 mg | INTRAMUSCULAR | Status: DC | PRN

## 2015-01-19 MED ORDER — STERILE WATER FOR IRRIGATION IR SOLN
Status: DC | PRN
  Administered 2015-01-19: 1500 mL

## 2015-01-19 SURGICAL SUPPLY — 53 items
ADH SKN CLS APL DERMABOND .7 (GAUZE/BANDAGES/DRESSINGS) ×1
BAG SPEC RTRVL 10 TROC 200 (ENDOMECHANICALS) ×1
BAG SPEC RTRVL LRG 6X4 10 (ENDOMECHANICALS)
CHLORAPREP W/TINT 26ML (MISCELLANEOUS) ×2 IMPLANT
COVER SURGICAL LIGHT HANDLE (MISCELLANEOUS) ×2 IMPLANT
COVER TIP SHEARS 8 DVNC (MISCELLANEOUS) ×1 IMPLANT
COVER TIP SHEARS 8MM DA VINCI (MISCELLANEOUS) ×2
DERMABOND ADVANCED (GAUZE/BANDAGES/DRESSINGS) ×1
DERMABOND ADVANCED .7 DNX12 (GAUZE/BANDAGES/DRESSINGS) IMPLANT
DRAPE ARM DVNC X/XI (DISPOSABLE) ×4 IMPLANT
DRAPE COLUMN DVNC XI (DISPOSABLE) ×1 IMPLANT
DRAPE DA VINCI XI ARM (DISPOSABLE) ×4
DRAPE DA VINCI XI COLUMN (DISPOSABLE) ×1
DRAPE SHEET LG 3/4 BI-LAMINATE (DRAPES) ×4 IMPLANT
DRAPE SURG IRRIG POUCH 19X23 (DRAPES) ×2 IMPLANT
DRAPE TABLE BACK 44X90 PK DISP (DRAPES) ×4 IMPLANT
DRAPE WARM FLUID 44X44 (DRAPE) ×2 IMPLANT
ELECT REM PT RETURN 9FT ADLT (ELECTROSURGICAL) ×2
ELECTRODE REM PT RTRN 9FT ADLT (ELECTROSURGICAL) ×1 IMPLANT
GLOVE BIO SURGEON STRL SZ 6 (GLOVE) ×6 IMPLANT
GLOVE BIO SURGEON STRL SZ 6.5 (GLOVE) ×4 IMPLANT
GOWN STRL REUS W/ TWL LRG LVL3 (GOWN DISPOSABLE) ×2 IMPLANT
GOWN STRL REUS W/TWL LRG LVL3 (GOWN DISPOSABLE) ×4
HOLDER FOLEY CATH W/STRAP (MISCELLANEOUS) ×2 IMPLANT
KIT BASIN OR (CUSTOM PROCEDURE TRAY) ×2 IMPLANT
LIQUID BAND (GAUZE/BANDAGES/DRESSINGS) ×2 IMPLANT
MANIPULATOR UTERINE 4.5 ZUMI (MISCELLANEOUS) ×2 IMPLANT
OCCLUDER COLPOPNEUMO (BALLOONS) ×2 IMPLANT
PEN SKIN MARKING BROAD (MISCELLANEOUS) ×2 IMPLANT
POUCH RETRIEVAL ECOSAC 10 (ENDOMECHANICALS) IMPLANT
POUCH RETRIEVAL ECOSAC 10MM (ENDOMECHANICALS) ×1
POUCH SPECIMEN RETRIEVAL 10MM (ENDOMECHANICALS) IMPLANT
SEAL CANN UNIV 5-8 DVNC XI (MISCELLANEOUS) ×4 IMPLANT
SEAL XI 5MM-8MM UNIVERSAL (MISCELLANEOUS) ×4
SET TUBE IRRIG SUCTION NO TIP (IRRIGATION / IRRIGATOR) ×2 IMPLANT
SHEET LAVH (DRAPES) ×2 IMPLANT
SOLUTION ELECTROLUBE (MISCELLANEOUS) ×2 IMPLANT
SUT VIC AB 0 CT1 27 (SUTURE) ×6
SUT VIC AB 0 CT1 27XBRD ANTBC (SUTURE) ×1 IMPLANT
SUT VIC AB 2-0 SH 27 (SUTURE) ×2
SUT VIC AB 2-0 SH 27X BRD (SUTURE) IMPLANT
SUT VIC AB 3-0 SH 27 (SUTURE) ×4
SUT VIC AB 3-0 SH 27X BRD (SUTURE) IMPLANT
SUT VIC AB 4-0 PS2 27 (SUTURE) ×4 IMPLANT
SYR 50ML LL SCALE MARK (SYRINGE) ×2 IMPLANT
TOWEL OR 17X26 10 PK STRL BLUE (TOWEL DISPOSABLE) ×4 IMPLANT
TOWEL OR NON WOVEN STRL DISP B (DISPOSABLE) ×2 IMPLANT
TRAP SPECIMEN MUCOUS 40CC (MISCELLANEOUS) IMPLANT
TRAY FOLEY W/METER SILVER 14FR (SET/KITS/TRAYS/PACK) ×2 IMPLANT
TRAY LAPAROSCOPIC (CUSTOM PROCEDURE TRAY) ×2 IMPLANT
TROCAR BLADELESS OPT 5 100 (ENDOMECHANICALS) ×2 IMPLANT
TROCAR XCEL 12X100 BLDLESS (ENDOMECHANICALS) ×2 IMPLANT
WATER STERILE IRR 1500ML POUR (IV SOLUTION) ×4 IMPLANT

## 2015-01-19 NOTE — Anesthesia Procedure Notes (Signed)
Procedure Name: Intubation Date/Time: 01/19/2015 7:43 AM Performed by: Chyrel Masson Pre-anesthesia Checklist: Patient identified, Emergency Drugs available, Suction available, Patient being monitored and Timeout performed Patient Re-evaluated:Patient Re-evaluated prior to inductionOxygen Delivery Method: Circle system utilized and Simple face mask Preoxygenation: Pre-oxygenation with 100% oxygen Intubation Type: IV induction Ventilation: Mask ventilation without difficulty Laryngoscope Size: Mac and 4 Grade View: Grade I Tube type: Oral Laser Tube: Cuffed inflated with minimal occlusive pressure - saline Tube size: 7.5 mm Number of attempts: 1 Airway Equipment and Method: Stylet Placement Confirmation: ETT inserted through vocal cords under direct vision,  positive ETCO2 and breath sounds checked- equal and bilateral Secured at: 22 cm Dental Injury: Teeth and Oropharynx as per pre-operative assessment

## 2015-01-19 NOTE — Anesthesia Preprocedure Evaluation (Signed)
Anesthesia Evaluation  Patient identified by MRN, date of birth, ID band Patient awake    Reviewed: Allergy & Precautions, NPO status , Patient's Chart, lab work & pertinent test results  Airway Mallampati: II  TM Distance: >3 FB Neck ROM: Full    Dental   Pulmonary shortness of breath, asthma , COPDCurrent Smoker,  breath sounds clear to auscultation        Cardiovascular negative cardio ROS  Rhythm:Regular Rate:Normal     Neuro/Psych    GI/Hepatic Neg liver ROS, PUD,   Endo/Other  negative endocrine ROS  Renal/GU negative Renal ROS     Musculoskeletal   Abdominal   Peds  Hematology   Anesthesia Other Findings   Reproductive/Obstetrics                             Anesthesia Physical Anesthesia Plan  ASA: III  Anesthesia Plan: General   Post-op Pain Management:    Induction: Intravenous  Airway Management Planned: Oral ETT  Additional Equipment:   Intra-op Plan:   Post-operative Plan: Possible Post-op intubation/ventilation  Informed Consent: I have reviewed the patients History and Physical, chart, labs and discussed the procedure including the risks, benefits and alternatives for the proposed anesthesia with the patient or authorized representative who has indicated his/her understanding and acceptance.   Dental advisory given  Plan Discussed with: CRNA and Anesthesiologist  Anesthesia Plan Comments:         Anesthesia Quick Evaluation

## 2015-01-19 NOTE — Anesthesia Postprocedure Evaluation (Signed)
  Anesthesia Post-op Note  Patient: Melissa Alvarez  Procedure(s) Performed: Procedure(s): ROBOTIC ASSISTED RADICAL HYSTERECTOMY RIGHT TUBE AND OVARY, LEFT TUBE (N/A) PELVIC LYMPH NODE DISSECTION (N/A)  Patient Location: PACU  Anesthesia Type:General  Level of Consciousness: awake  Airway and Oxygen Therapy: Patient Spontanous Breathing  Post-op Pain: mild  Post-op Assessment: Post-op Vital signs reviewed              Post-op Vital Signs: Reviewed  Last Vitals:  Filed Vitals:   01/19/15 1215  BP: 122/68  Pulse: 78  Temp:   Resp: 15    Complications: No apparent anesthesia complications

## 2015-01-19 NOTE — Progress Notes (Signed)
Utilization review completed.  

## 2015-01-19 NOTE — Transfer of Care (Signed)
Immediate Anesthesia Transfer of Care Note  Patient: Melissa Alvarez  Procedure(s) Performed: Procedure(s): ROBOTIC ASSISTED RADICAL HYSTERECTOMY RIGHT TUBE AND OVARY, LEFT TUBE (N/A) PELVIC LYMPH NODE DISSECTION (N/A)  Patient Location: PACU  Anesthesia Type:General  Level of Consciousness: alert  and oriented  Airway & Oxygen Therapy: Patient Spontanous Breathing and Patient connected to face mask oxygen  Post-op Assessment: Report given to RN and Post -op Vital signs reviewed and stable  Post vital signs: Reviewed and stable  Last Vitals:  Filed Vitals:   01/19/15 0513  BP: 123/68  Pulse: 82  Temp: 37.2 C  Resp: 17    Complications: No apparent anesthesia complications

## 2015-01-19 NOTE — H&P (View-Only) (Signed)
POSTOPERATIVE FOLLOWUP Assessment:    39 y.o. year old with Stage IB1 cervical cancer.   S/p cold knife conization on 12/08/14. Now with symptoms concerning for fibroid degeneration or pelvic infection.  Plan: 1) Pathology reports reviewed today 2) Treatment counseling - I discussed with Melissa Alvarez that she still has malignancy present. While it was very small in dimensions on the cone, there was malignancy in the post cone ECC. Therefore I am recommending a type II modified radical hysterectomy with pelvic lymphadenectomy (LVSI was present).   I extensively reviewed the risks of infection, bleeding, damage to nearby organs including bowel, bladder, vessels, and ureters. We discussed postoperative risks including infection, PE/ DVT, changes and risk of bowel and bladder dysfunction and lymphedema. We also reviewed possible need for further treatment such as radiation or chemotherapy based on final pathology. I counseled her on need for foley catheter x 7-10 days and possible risk of prolonged catheterization if unable to void.  I discussed that we will attempt the radical hysterectomy robotically, however, if we are unable to complete it through this approach secondary to fibroids, she may require a laparotomy. We may need to perform intraoperative myomectomy to facilitate the surgery.  She was given the opportunity to ask questions, which were answered to her satisfaction, and she is agreement with the above mentioned plan of care.  With respect to her symptoms, I believe these are in part due to pelvic inflammation after cone, however, they are more likely secondary to a degenerating fibroid (based on the CT findings) and the fact that these were present pre-cone. I recommended continuing ibuprofen, and continuing her course of antibiotics.  3)  Return for surgery on 7/19.  HPI:  Melissa Alvarez is a 39 y.o. year old G2P2000 initially seen in consultation on 11/24/14 for cervical cancer.  She then  underwent a cold knife conization on 12/02/81 without complications.  Her postoperative course was complicated by a trip to the ER for severe pain post cone. CT imaging showed no abscess or abnormality. Her white count was mildly elevated but she was afebrile. She was discharged from the ER and I prescribed a course of doxy and flagl for presumed cervicitis. She continues to have pain postop op and so I repeated her CT imaging on 12/23/14 and this showed slightly more pelvic fluid, but no abscess. She has multiple fibroids, one of which appears to be degenerating.   Her final pathology from her cone revealed invasive moderately differentiated SCC measuring 1.42mm in greatest dimension with 1.74mm depth of invasion. There were multiple foci all less than 42mm. LVSI was present. 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 The post cone endocervical curretting was positive for fragments of squamous cell carcinoma.  She is seen today for a postoperative check and to discuss her pathology results and ongoing plan.  Since discharge from the hospital, she is feeling continue pain. She reports that this pain was present preoperative, and she was having RLQ pains for several months and that Dr Ihor Dow thought this might be from the pedunculated fibroid twisting.  She has improving appetite, normal bowel and bladder function, and pain controlled with minimal PO medication. She has no other complaints today.    Review of systems: Constitutional:  She has no weight gain or weight loss. She has no fever or chills. Eyes: No blurred vision Ears, Nose, Mouth, Throat: No dizziness, headaches or changes in hearing. No mouth sores. Cardiovascular: No chest pain, palpitations or edema. Respiratory:  No  shortness of breath, wheezing or cough Gastrointestinal: She has normal bowel movements without diarrhea or constipation. She denies any nausea or vomiting. She denies blood in her stool or heart burn. Genitourinary:  She denies  pelvic pain, pelvic pressure or changes in her urinary function. She has no hematuria, dysuria, or incontinence. She has no irregular vaginal bleeding or vaginal discharge Musculoskeletal: Denies muscle weakness or joint pains.  Skin:  She has no skin changes, rashes or itching Neurological:  Denies dizziness or headaches. No neuropathy, no numbness or tingling. Psychiatric:  She denies depression or anxiety. Hematologic/Lymphatic:   No easy bruising or bleeding   Physical Exam: Blood pressure 124/71, pulse 89, temperature 98.3 F (36.8 C), temperature source Oral, resp. rate 20, height 5\' 6"  (1.676 m), weight 188 lb 1.6 oz (85.322 kg), last menstrual period 09/01/2014. General: Well dressed, well nourished in no apparent distress.   HEENT:  Normocephalic and atraumatic, no lesions.  Extraocular muscles intact. Sclerae anicteric. Pupils equal, round, reactive. No mouth sores or  Abdomen:  Soft, nontender, nondistended.  No palpable masses.  No hepatosplenomegaly.  No ascites. Normal bowel sounds.  No hernias.   Genitourinary: Normal EGBUS  Cervix healing normally from cone. No gross tumor visible. No infection, bleeding or discharge. Significant tenderness on bimanual examination. Extremities: No cyanosis, clubbing or edema.  No calf tenderness or erythema. No palpable cords. Psychiatric: Mood and affect are appropriate. Neurological: Awake, alert and oriented x 3. Sensation is intact, no neuropathy.  Musculoskeletal: No pain, normal strength and range of motion.  [name], MD

## 2015-01-19 NOTE — Interval H&P Note (Signed)
History and Physical Interval Note:  01/19/2015 7:13 AM  Melissa Alvarez  has presented today for surgery, with the diagnosis of CERVICAL CANCER   The various methods of treatment have been discussed with the patient and family. After consideration of risks, benefits and other options for treatment, the patient has consented to  Procedure(s): ROBOTIC ASSISTED RADIAL HYSTERECTOMY  (N/A) PELVIC LYMPH NODE DISSECTION (N/A) POSSIBLE LAPAROTOMY (N/A) as a surgical intervention .  The patient's history has been reviewed, patient examined, no change in status, stable for surgery.  I have reviewed the patient's chart and labs.  Questions were answered to the patient's satisfaction.     [name]

## 2015-01-19 NOTE — Op Note (Signed)
OPERATIVE NOTE 12/15/14  Surgeon: [name]   Assistants: Dr Lahoma Crocker (an MD assistant was necessary for tissue manipulation, management of robotic instrumentation, retraction and positioning due to the complexity of the case and hospital policies).   Anesthesia: General endotracheal anesthesia  ASA Class: 3   Pre-operative Diagnosis: stage IB1 microscopic cervical cancer  Post-operative Diagnosis: same and fibroid uterus and right ovarian fibroma  Operation: Robotic-assisted type II modified radical laparoscopic hysterectomy with bilateral salpingectomy right oophorectomy and bilateral pelvic lymphadenectomy  Surgeon: [name]  Assistant Surgeon: Lahoma Crocker MD  Anesthesia: GET  Urine Output: 200  Operative Findings:  : 16 week size fibroid uterus with pedunculated fundal fibroid. No gross disease visible on cervix. No suspicious lymph nodes. Right ovarian 4cm mass (fibroma on frozen). Normal left ovary with corpus luteum cyst.   Estimated Blood Loss:  200 mL      Total IV Fluids: 1,000 ml         Specimens: uterus, cervix, bilatearl fallopian tubes, right ovary, bilateral pelvic lymph nodes.         Complications:  None; patient tolerated the procedure well.         Disposition: PACU - hemodynamically stable.  Indications: the patient has stage IB1 cervical well differentiated SCC. Her preoperative cone showed SCC 1.64mm depth of invasion, with multiple foci, however, the post cone ECC (of remaining cervix) was positive for SCC of undeterminable dimensions (thereby staging the patient as a microscopic IB1 cervical cancer). LVSI was present on the cone specimen tumor.  Procedure Details  The patient was seen in the Holding Room. The risks, benefits, complications, treatment options, and expected outcomes were discussed with the patient.  The patient concurred with the proposed plan, giving informed consent.  The site of surgery properly  noted/marked. The patient was identified as Melissa Alvarez and the procedure verified as a Robotic-assisted radical hysterectomy with bilateral salpingectomy and bilateral pelvic lymphadenectomy. A Time Out was held and the above information confirmed.  After induction of anesthesia, the patient was draped and prepped in the usual sterile manner. Pt was placed in supine position after anesthesia and draped and prepped in the usual sterile manner. The abdominal drape was placed after the CholoraPrep had been allowed to dry for 3 minutes.  Her arms were tucked to her side with all appropriate precautions.  The chest was secured to the table.  The patient was placed in the semi-lithotomy position in [place].  The perineum was prepped with Betadine.  Foley catheter was placed.  An EEA sizer (large size) was placed vaginally to define the vaginal fornices.  A second time-out was performed.  OG tube placement was confirmed and to suction.    Procedure:  The patient was brought to the operating room where general anesthesia was administered with no complications.  The patient was placed in the dorsal lithotomy position in padded Allen stirrups.  The arms were tucked at the sides with gel pads protecting the elbows and foam protecting the hands. The patient was then prepped.  A Foley was placed to gravity. An EEA sizer was placed in the vaginal fornices.  The patient was then draped in the normal manner.  Next, a 5 mm skin incision was made 1 cm below the subcostal margin in the midclavicular line.  The 5 mm Optiview port and scope was used for direct entry.  Opening pressure was under 10 mm CO2.  The abdomen was insufflated and the  findings were noted as above.   At this point and all points during the procedure, the patient's intra-abdominal pressure did not exceed 15 mmHg. Next, a 10 mm skin incision was made at the umbilicus and a right and left port was placed about 10 cm lateral to the robot port on the  right and left side.  A fourth arm was placed in the left lower quadrant 2 cm above and superior and medial to the anterior superior iliac spine.  All ports were placed under direct visualization.  The patient was placed in steep Trendelenburg.  Bowel was away into the upper abdomen.  The robot was docked in the normal manner.  The right retroperitoneum in the pelvis was opened parallel to the IP ligament. The right paravesical space was developed with monopolar and sharp dissection. It was held open with tension on the median umbilical ligament with the forth arm. The pararectal space was opened with blunt and sharp dissection to mobilize the ureter off of the medial surface of the internal iliac artery. The medial leaf of the broad ligament containing the ureter was held medially (opening the pararectal space) by the assistant's grasper. The right pelvic lymphadenectomy was performed by skeletonizing the internal iliac artery at the bifurcation with the external iliac artery. The obturator nerve was identified in the base of lateral paravesical space. The ureter was mobilized medially off of the dissection by developing the pararectal space. The genitofemoral nerve was identified, skeletonized and mobilized laterally off of the external iliac artery. An enbloc resection of lymph nodes was performed within the following boundaries: the mid portion of the common iliac proximally, the circumflex iliac vein distally, the obturator nerve posteriorally, the genitofemoral nerve laterally. The nodal basin (including obturator space) were confirmed to be empty of nodes and hemostatic. The nodes were placed in an endocatch bag and retrieved at the end of the procedure vaginally.  The same procedure with the same steps was performed on the patient's left side. The left retroperitoneum in the pelvis was opened parallel to the IP ligament. The left paravesical space was developed with monopolar and sharp dissection. It was  held open with tension on the median umbilical ligament with the forth arm. The pararectal space was opened with blunt and sharp dissection to mobilize the ureter off of the medial surface of the internal iliac artery. The medial leaf of the broad ligament containing the ureter was held medially (opening the pararectal space) by the assistant's grasper. The left pelvic lymphadenectomy was performed by skeletonizing the internal iliac artery at the bifurcation with the external iliac artery. The obturator nerve was identified in the base of lateral paravesical space. The ureter was mobilized medially off of the dissection by developing the pararectal space. The genitofemoral nerve was identified, skeletonized and mobilized laterally off of the external iliac artery. An enbloc resection of lymph nodes was performed within the following boundaries: the mid portion of the common iliac proximally, the circumflex iliac vein distally, the obturator nerve posteriorally, the genitofemoral nerve laterally. The nodal basin (including obturator space) were confirmed to be empty of nodes and hemostatic. The nodes were placed in an endocatch bag and retrieved at the end of the procedure vaginally.  The radical hysterectomy was begun by first skeletonizing the right uterine artery at its origin from the internal iliac artery by skeletonizing it 360 degrees and defining the parauterine web between the paravesical and pararectal spaces. The right ureter was skeletonized off of its attachments to  the broad ligament and mobilized laterally. Using meticulous blunt and sparing monopolar dissection in short controlled bursts, the right ureter was untunnelled from under the right uterine artery. The anterior vessicouterine ligament was developed and the bladder flap was taken down to below the level of the tumor and below the rounded portion of the EEA sizer. This then definied the anterior bladder pillar. The uterine artery was bipolar  fulgarated to seal it, then transected. The uterine vein was also sealed and resected. The ureter was untunnelled under the uterine artery and the lateral boundary of the parametrium was taken down at a level overlying the ureter with biploar and monopolar dissection with care to reflect the mobilized ureter laterally. The uterine vessels were retracted superior and medially over the ureter on the right. The ureter was untunnelled through to its entry into the bladder with meticulous sharp dissection and short bursts of monopolar energy. The ureter was dissected off of its attachments to the anterior vagina. The anterior bladder pillar was skeletonized and sealed with bipolar energy while the ureter was deflected distally. The posterior bladder pillar was also dissected with monopolar scissors from the upper vagina.  The rectovaginal septum was entered posteriorally with sharp dissection and the rectum was dissected off of the vagina. A window was created in the broad ligament with care to mobilize the ureter laterally. This skeletonized the IP ligament which was sealed with bipolar energy and transected.The right uterosacral ligament was transected with bipolar and monopolar energy 1/3rd of the length from the uterine insertion. In doing so meticulous attention was made to identify and wherever possible, spare the hypogastric nerves. The right paravaginal tissues were tubularized around the vagina on the right at the inferior boundary of the dissection 2cm beyond the cervicovaginal junction.  The left radical hysterectomy was then performed by first skeletonizing the left uterine artery at its origin from the internal iliac artery by skeletonizing it 360 degrees and defining the parauterine web between the paravesical and pararectal spaces. The left ureter was skeletonized off of its attachments to the broad ligament and mobilized laterally. Using meticulous blunt and sparing monopolar dissection in short  controlled bursts, the left ureter was untunnelled from under the left uterine artery. The left anterior vessicouterine ligament was developed and the bladder flap was taken down to below the level of the tumor and below the rounded portion of the EEA sizer. This then definied the anterior bladder pillar. The uterine artery was bipolar fulgarated to seal it above the level of the ureteral tunnel, then transected. The uterine vein was also sealed and resected. The lateral boundary of the parametrium (level with the distal ureter) was taken down with biploar and monopolar dissection to the level 2cm below the cervicovaginal junction. The uterine vessels were retracted superior and medially over the ureter on the right. The ureter was untunnelled through to its entry into the bladder with meticulous sharp dissection and short bursts of monopolar energy. The ureter was dissected off of its attachments to the anterior vagina. The anterior bladder pillar was skeletonized and sealed with bipolar energy while the ureter was deflected distally. The posterior bladder pillar was also dissected with monopolar scissors from the upper vagina.  The rectovaginal septum was entered posteriorally with sharp dissection and the rectum was dissected off of the vagina. A window was created in the broad ligament with care to mobilize the ureter laterally. This skeletonized fallopian tube was dissected from the left ovary. The utero-ovarian ligament was sealed with  bipolar energy and transected separating the ovary from the left fallopian tube and uterus.The left uterosacral ligament was transected with bipolar and monopolar energy 1/3rd towards the uterosacral ligament insertion into the sacrum from the uterus. In doing so meticulous attention was made to identify and wherever possible, spare the hypogastric nerves. The left paravaginal tissues were tubularized around the vagina on the left at the inferior boundary of the  dissection.  The colpotomy was made and the uterus, cervix, bilateral ovaries and tubes were amputated and delivered through the vagina.  Pedicles were inspected and excellent hemostasis was achieved.    Frozen section on the right ovary which was enlarged and containing a 4cm mass was performed and revealed a benign fibroma.  The colpotomy at the vaginal cuff was closed with two 0-Vicryl on a CT1 needle in a running manner.  Irrigation was used and excellent hemostasis was achieved.  At this point in the procedure was completed.  Robotic instruments were removed under direct visulaization.  The robot was undocked. The 10 mm ports were closed with Vicryl on a UR-5 needle and the fascia was closed with 0 Vicryl on a UR-5 needle.  The skin was closed with 4-0 Vicryl in a subcuticular manner.  Dermabond was applied.  Sponge, lap and needle counts correct x 2.  A right vaginal sulcal laceration and 1cm abrasion on the exterior left vulva were sutured with 2-0 vicryl.The patient was taken to the recovery room in stable condition.  The vagina was swabbed with  minimal bleeding noted.   All instrument and needle counts were correct x  3.   The patient was transferred to the recovery room in a stable condition.  [name], MD

## 2015-01-20 ENCOUNTER — Encounter (HOSPITAL_COMMUNITY): Payer: Self-pay | Admitting: Gynecologic Oncology

## 2015-01-20 LAB — CBC
HCT: 31.7 % — ABNORMAL LOW (ref 36.0–46.0)
Hemoglobin: 9.4 g/dL — ABNORMAL LOW (ref 12.0–15.0)
MCH: 20.1 pg — ABNORMAL LOW (ref 26.0–34.0)
MCHC: 29.7 g/dL — ABNORMAL LOW (ref 30.0–36.0)
MCV: 67.7 fL — AB (ref 78.0–100.0)
Platelets: 199 10*3/uL (ref 150–400)
RBC: 4.68 MIL/uL (ref 3.87–5.11)
RDW: 21.4 % — AB (ref 11.5–15.5)
WBC: 9.1 10*3/uL (ref 4.0–10.5)

## 2015-01-20 LAB — BASIC METABOLIC PANEL
ANION GAP: 7 (ref 5–15)
BUN: <5 mg/dL — ABNORMAL LOW (ref 6–20)
CO2: 24 mmol/L (ref 22–32)
CREATININE: 0.59 mg/dL (ref 0.44–1.00)
Calcium: 8.4 mg/dL — ABNORMAL LOW (ref 8.9–10.3)
Chloride: 106 mmol/L (ref 101–111)
GFR calc non Af Amer: >60 mL/min (ref >60)
GLUCOSE: 106 mg/dL — AB (ref 65–99)
POTASSIUM: 3.5 mmol/L (ref 3.5–5.1)
Sodium: 137 mmol/L (ref 135–145)

## 2015-01-20 MED ORDER — OXYCODONE-ACETAMINOPHEN 10-325 MG PO TABS: 1.0000 | ORAL_TABLET | ORAL | Status: DC | PRN

## 2015-01-20 NOTE — Discharge Summary (Signed)
Physician Discharge Summary  Patient ID: Melissa Alvarez MRN: [id number] DOB/AGE: 02-Dec-1975 39 y.o.  Admit date: 01/19/2015 Discharge date: 01/20/2015  Admission Diagnoses: Cervical cancer  Discharge Diagnoses:  Principal Problem:   Cervical cancer Active Problems:   Cervical cancer, FIGO stage IB1   Discharged Condition:  The patient is in good condition and stable for discharge.    Hospital Course: On 01/19/2015, the patient underwent the following: Procedure(s):  ROBOTIC ASSISTED RADICAL HYSTERECTOMY RIGHT TUBE AND OVARY, LEFT TUBE PELVIC LYMPH NODE DISSECTION.  The postoperative course was uneventful.  She was discharged to home on postoperative day 1 tolerating a regular diet, pain controlled with PO pain medications.  She is being discharged with the foley catheter and will follow up in one week at the [facility] for trial voiding.  Consults: None  Significant Diagnostic Studies: None  Treatments: surgery: see above  Discharge Exam: Blood pressure 119/63, pulse 83, temperature 98.9 F (37.2 C), temperature source Oral, resp. rate 20, height 5\' 6"  (1.676 m), weight 190 lb (86.183 kg), last menstrual period 01/19/2015, SpO2 100 %. General appearance: alert, cooperative and no distress Resp: scattered wheezes in the upper and right lower lobe.  Cleared with forceful cough Cardio: regular rate and rhythm, S1, S2 normal, no murmur, click, rub or gallop GI: soft, non-tender; bowel sounds normal; no masses,  no organomegaly Extremities: extremities normal, atraumatic, no cyanosis or edema Incision/Wound: Lap sites with dermabond without erythema or drainage  Disposition: 01-Home or Self Care      Discharge Instructions    Call MD for:  difficulty breathing, headache or visual disturbances    Complete by:  As directed      Call MD for:  extreme fatigue    Complete by:  As directed      Call MD for:  hives    Complete by:  As directed      Call MD for:  persistant  dizziness or light-headedness    Complete by:  As directed      Call MD for:  persistant nausea and vomiting    Complete by:  As directed      Call MD for:  redness, tenderness, or signs of infection (pain, swelling, redness, odor or green/yellow discharge around incision site)    Complete by:  As directed      Call MD for:  severe uncontrolled pain    Complete by:  As directed      Call MD for:  temperature >100.4    Complete by:  As directed      Care order/instruction    Complete by:  As directed   Instruct patient on leg bag use and give patient foley bag for nighttime use.     Diet - low sodium heart healthy    Complete by:  As directed      Driving Restrictions    Complete by:  As directed   No driving for 1 week.  Do not take narcotics and drive.     Increase activity slowly    Complete by:  As directed      Lifting restrictions    Complete by:  As directed   No lifting greater than 10 lbs.     Sexual Activity Restrictions    Complete by:  As directed   No sexual activity, nothing in the vagina, for 8 weeks.            Medication List    STOP taking these medications  megestrol 20 MG tablet  Commonly known as:  MEGACE      TAKE these medications        albuterol 108 (90 BASE) MCG/ACT inhaler  Commonly known as:  PROVENTIL HFA;VENTOLIN HFA  Inhale 2 puffs into the lungs once.     oxyCODONE-acetaminophen 10-325 MG per tablet  Commonly known as:  PERCOCET  Take 1 tablet by mouth every 4 (four) hours as needed for pain (severe pain).       Follow-up Information    Follow up with [name], MD On 02/19/2015.   Specialty:  Obstetrics and Gynecology   Why:  at 12:30pm at the [facility] information:   [place] [address] [phone]       Follow up with Kymber Kosar, Genella Rife, NP On 01/28/2015.   Specialty:  Gynecologic Oncology   Why:  at 10:45am the [facility] for foley catheter removal   Contact information:    [address] [place] [address] [phone]       Greater than thirty minutes were spend for face to face discharge instructions and discharge orders/summary in EPIC.   Signed: Jerriah Ines DEAL 01/20/2015, 10:24 AM

## 2015-01-20 NOTE — Discharge Instructions (Signed)
01/20/2015  Return to work: 4-6 weeks if applicable  Activity: 1. Be up and out of the bed during the day.  Take a nap if needed.  You may walk up steps but be careful and use the hand rail.  Stair climbing will tire you more than you think, you may need to stop part way and rest.   2. No lifting or straining for 6 weeks.  3. No driving for 1 week(s).  Do not drive if you are taking narcotic pain medicine.  4. Shower daily.  Use soap and water on your incision and pat dry; don't rub.  No tub baths until cleared by your surgeon.   5. No sexual activity and nothing in the vagina for 8 weeks.  Diet: 1. Low sodium Heart Healthy Diet is recommended.  2. It is safe to use a laxative, such as Miralax or Colace, if you have difficulty moving your bowels.   Wound Care: 1. Keep clean and dry.  Shower daily.  Reasons to call the Doctor:  Fever - Oral temperature greater than 100.4 degrees Fahrenheit  Foul-smelling vaginal discharge  Difficulty urinating  Nausea and vomiting  Increased pain at the site of the incision that is unrelieved with pain medicine.  Difficulty breathing with or without chest pain  New calf pain especially if only on one side  Sudden, continuing increased vaginal bleeding with or without clots.   Contacts: For questions or concerns you should contact:   Dr. [name] at [phone]  Joylene John, NP at [phone]  After Hours: call [phone] and ask for the GYN Oncologist on call  Foley Catheter Care A Foley catheter is a soft, flexible tube. This tube is placed into your bladder to drain pee (urine). If you go home with this catheter in place, follow the instructions below. TAKING CARE OF THE CATHETER  Wash your hands with soap and water.  Put soap and water on a clean washcloth.  Clean the skin where the tube goes into your body.  Clean away from the tube site.  Never wipe toward the tube.  Clean the area using a circular  motion.  Remove all the soap. Pat the area dry with a clean towel. For males, reposition the skin that covers the end of the penis (foreskin).  Attach the tube to your leg with tape or a leg strap. Do not stretch the tube tight. If you are using tape, remove any stickiness left behind by past tape you used.  Keep the drainage bag below your hips. Keep it off the floor.  Check your tube during the day. Make sure it is working and draining. Make sure the tube does not curl, twist, or bend.  Do not pull on the tube or try to take it out. TAKING CARE OF THE DRAINAGE BAGS You will have a large overnight drainage bag and a small leg bag. You may wear the overnight bag any time. Never wear the small bag at night. Follow the directions below. Emptying the Drainage Bag Empty your drainage bag when it is  - full or at least 2-3 times a day.  Wash your hands with soap and water.  Keep the drainage bag below your hips.  Hold the dirty bag over the toilet or clean container.  Open the pour spout at the bottom of the bag. Empty the pee into the toilet or container. Do not let the pour spout touch anything.  Clean the pour spout with a gauze  pad or cotton ball that has rubbing alcohol on it.  Close the pour spout.  Attach the bag to your leg with tape or a leg strap.  Wash your hands well. Changing the Drainage Bag Change your bag once a month or sooner if it starts to smell or look dirty.   Wash your hands with soap and water.  Pinch the rubber tube so that pee does not spill out.  Disconnect the catheter tube from the drainage tube at the connection valve. Do not let the tubes touch anything.  Clean the end of the catheter tube with an alcohol wipe. Clean the end of a the drainage tube with a different alcohol wipe.  Connect the catheter tube to the drainage tube of the clean drainage bag.  Attach the new bag to the leg with tape or a leg strap. Avoid attaching the new bag too  tightly.  Wash your hands well. Cleaning the Drainage Bag  Wash your hands with soap and water.  Wash the bag in warm, soapy water.  Rinse the bag with warm water.  Fill the bag with a mixture of white vinegar and water (1 cup vinegar to 1 quart warm water [.2 liter vinegar to 1 liter warm water]). Close the bag and soak it for 30 minutes in the solution.  Rinse the bag with warm water.  Hang the bag to dry with the pour spout open and hanging downward.  Store the clean bag (once it is dry) in a clean plastic bag.  Wash your hands well. PREVENT INFECTION  Wash your hands before and after touching your tube.  Take showers every day. Wash the skin where the tube enters your body. Do not take baths. Replace wet leg straps with dry ones, if this applies.  Do not use powders, sprays, or lotions on the genital area. Only use creams, lotions, or ointments as told by your doctor.  For females, wipe from front to back after going to the bathroom.  Drink enough fluids to keep your pee clear or pale yellow unless you are told not to have too much fluid (fluid restriction).  Do not let the drainage bag or tubing touch or lie on the floor.  Wear cotton underwear to keep the area dry. GET HELP IF:  Your pee is cloudy or smells unusually bad.  Your tube becomes clogged.  You are not draining pee into the bag or your bladder feels full.  Your tube starts to leak. GET HELP RIGHT AWAY IF:  You have pain, puffiness (swelling), redness, or yellowish-white fluid (pus) where the tube enters the body.  You have pain in the belly (abdomen), legs, lower back, or bladder.  You have a fever.  You see blood fill the tube, or your pee is pink or red.  You feel sick to your stomach (nauseous), throw up (vomit), or have chills.  Your tube gets pulled out. MAKE SURE YOU:   Understand these instructions.  Will watch your condition.  Will get help right away if you are not doing well or  get worse. Document Released: 10/14/2012 Document Revised: 11/03/2013 Document Reviewed: 10/14/2012 [facility] Patient Information 2015 [place], [place]. This information is not intended to replace advice given to you by your health care provider. Make sure you discuss any questions you have with your health care provider.  Acetaminophen; Oxycodone tablets What is this medicine? ACETAMINOPHEN; OXYCODONE (a set a MEE noe fen; ox i KOE done) is a pain reliever. It  is used to treat mild to moderate pain. This medicine may be used for other purposes; ask your health care provider or pharmacist if you have questions. COMMON BRAND NAME(S): Endocet, Magnacet, Narvox, Percocet, Perloxx, Primalev, Primlev, Roxicet, Xolox What should I tell my health care provider before I take this medicine? They need to know if you have any of these conditions: -brain tumor -Crohn's disease, inflammatory bowel disease, or ulcerative colitis -drug abuse or addiction -head injury -heart or circulation problems -if you often drink alcohol -kidney disease or problems going to the bathroom -liver disease -lung disease, asthma, or breathing problems -an unusual or allergic reaction to acetaminophen, oxycodone, other opioid analgesics, other medicines, foods, dyes, or preservatives -pregnant or trying to get pregnant -breast-feeding How should I use this medicine? Take this medicine by mouth with a full glass of water. Follow the directions on the prescription label. Take your medicine at regular intervals. Do not take your medicine more often than directed. Talk to your pediatrician regarding the use of this medicine in children. Special care may be needed. Patients over 5 years old may have a stronger reaction and need a smaller dose. Overdosage: If you think you have taken too much of this medicine contact a poison control center or emergency room at once. NOTE: This medicine is only for you. Do not share this medicine  with others. What if I miss a dose? If you miss a dose, take it as soon as you can. If it is almost time for your next dose, take only that dose. Do not take double or extra doses. What may interact with this medicine? -alcohol -antihistamines -barbiturates like amobarbital, butalbital, butabarbital, methohexital, pentobarbital, phenobarbital, thiopental, and secobarbital -benztropine -drugs for bladder problems like solifenacin, trospium, oxybutynin, tolterodine, hyoscyamine, and methscopolamine -drugs for breathing problems like ipratropium and tiotropium -drugs for certain stomach or intestine problems like propantheline, homatropine methylbromide, glycopyrrolate, atropine, belladonna, and dicyclomine -general anesthetics like etomidate, ketamine, nitrous oxide, propofol, desflurane, enflurane, halothane, isoflurane, and sevoflurane -medicines for depression, anxiety, or psychotic disturbances -medicines for sleep -muscle relaxants -naltrexone -narcotic medicines (opiates) for pain -phenothiazines like perphenazine, thioridazine, chlorpromazine, mesoridazine, fluphenazine, prochlorperazine, promazine, and trifluoperazine -scopolamine -tramadol -trihexyphenidyl This list may not describe all possible interactions. Give your health care provider a list of all the medicines, herbs, non-prescription drugs, or dietary supplements you use. Also tell them if you smoke, drink alcohol, or use illegal drugs. Some items may interact with your medicine. What should I watch for while using this medicine? Tell your doctor or health care professional if your pain does not go away, if it gets worse, or if you have new or a different type of pain. You may develop tolerance to the medicine. Tolerance means that you will need a higher dose of the medication for pain relief. Tolerance is normal and is expected if you take this medicine for a long time. Do not suddenly stop taking your medicine because you may  develop a severe reaction. Your body becomes used to the medicine. This does NOT mean you are addicted. Addiction is a behavior related to getting and using a drug for a non-medical reason. If you have pain, you have a medical reason to take pain medicine. Your doctor will tell you how much medicine to take. If your doctor wants you to stop the medicine, the dose will be slowly lowered over time to avoid any side effects. You may get drowsy or dizzy. Do not drive, use machinery, or do anything  that needs mental alertness until you know how this medicine affects you. Do not stand or sit up quickly, especially if you are an older patient. This reduces the risk of dizzy or fainting spells. Alcohol may interfere with the effect of this medicine. Avoid alcoholic drinks. There are different types of narcotic medicines (opiates) for pain. If you take more than one type at the same time, you may have more side effects. Give your health care provider a list of all medicines you use. Your doctor will tell you how much medicine to take. Do not take more medicine than directed. Call emergency for help if you have problems breathing. The medicine will cause constipation. Try to have a bowel movement at least every 2 to 3 days. If you do not have a bowel movement for 3 days, call your doctor or health care professional. Do not take Tylenol (acetaminophen) or medicines that have acetaminophen with this medicine. Too much acetaminophen can be very dangerous. Many nonprescription medicines contain acetaminophen. Always read the labels carefully to avoid taking more acetaminophen. What side effects may I notice from receiving this medicine? Side effects that you should report to your doctor or health care professional as soon as possible: -allergic reactions like skin rash, itching or hives, swelling of the face, lips, or tongue -breathing difficulties, wheezing -confusion -light headedness or fainting spells -severe  stomach pain -unusually weak or tired -yellowing of the skin or the whites of the eyes Side effects that usually do not require medical attention (report to your doctor or health care professional if they continue or are bothersome): -dizziness -drowsiness -nausea -vomiting This list may not describe all possible side effects. Call your doctor for medical advice about side effects. You may report side effects to FDA at 1-800-FDA-1088. Where should I keep my medicine? Keep out of the reach of children. This medicine can be abused. Keep your medicine in a safe place to protect it from theft. Do not share this medicine with anyone. Selling or giving away this medicine is dangerous and against the law. Store at room temperature between 20 and 25 degrees C (68 and 77 degrees F). Keep container tightly closed. Protect from light. This medicine may cause accidental overdose and death if it is taken by other adults, children, or pets. Flush any unused medicine down the toilet to reduce the chance of harm. Do not use the medicine after the expiration date. NOTE: This sheet is a summary. It may not cover all possible information. If you have questions about this medicine, talk to your doctor, pharmacist, or health care provider.  2015, [employer]. (2013-02-10 13:17:35)  Abdominal Hysterectomy, Care After These instructions give you information on caring for yourself after your procedure. Your doctor may also give you more specific instructions. Call your doctor if you have any problems or questions after your procedure.  HOME CARE It takes 4-6 weeks to recover from this surgery. Follow all of your doctor's instructions.   Only take medicines as told by your doctor.  Change your bandage as told by your doctor.  Return to your doctor to have your stitches taken out.  Take showers for 2-3 weeks. Ask your doctor when it is okay to shower.  Do not douche, use tampons, or have sex  (intercourse) for at least 6 weeks or as told.  Follow your doctor's advice about exercise, lifting objects, driving, and general activities.  Get plenty of rest and sleep.  Do not lift anything heavier than  a gallon of milk (about 10 pounds [4.5 kilograms]) for the first month after surgery.  Get back to your normal diet as told by your doctor.  Do not drink alcohol until your doctor says it is okay.  Take a medicine to help you poop (laxative) as told by your doctor.  Eating foods high in fiber may help you poop. Eat a lot of raw fruits and vegetables, whole grains, and beans.  Drink enough fluids to keep your pee (urine) clear or pale yellow.  Have someone help you at home for 1-2 weeks after your surgery.  Keep follow-up doctor visits as told. GET HELP IF:  You have chills or fever.  You have puffiness, redness, or pain in area of the cut (incision).  You have yellowish-white fluid (pus) coming from the cut.  You have a bad smell coming from the cut or bandage.  Your cut pulls apart.  You feel dizzy or light-headed.  You have pain or bleeding when you pee.  You keep having watery poop (diarrhea).  You keep feeling sick to your stomach (nauseous) or keep throwing up (vomiting).  You have fluid (discharge) coming from your vagina.  You have a rash.  You have a reaction to your medicine.  You need stronger pain medicine. GET HELP RIGHT AWAY IF:   You have a fever and your symptoms suddenly get worse.  You have bad belly (abdominal) pain.  You have chest pain.  You are short of breath.  You pass out (faint).  You have pain, puffiness, or redness of your leg.  You bleed a lot from your vagina and notice clumps of tissue (clots). MAKE SURE YOU:   Understand these instructions.  Will watch your condition.  Will get help right away if you are not doing well or get worse. Document Released: 03/28/2008 Document Revised: 06/24/2013 Document Reviewed:  04/11/2013 [facility] Patient Information 2015 [place], [place]. This information is not intended to replace advice given to you by your health care provider. Make sure you discuss any questions you have with your health care provider.

## 2015-01-20 NOTE — Progress Notes (Signed)
Patient is alert and oriented. Tolerated diet with no nausea. PRN pain medication required once for oxycodone 5mg  with relief for patient. Vital signs stable. No signs or symptoms of acute distress. Patient is being discharged to home with daughter. Reviewed discharge education, discharge instructions and medications. Educated patient on foley catheter care. Patient states understanding. She is to follow-up in one week to have foley removed at her physicians office.

## 2015-01-21 ENCOUNTER — Observation Stay (HOSPITAL_COMMUNITY): Payer: Medicare Other

## 2015-01-21 ENCOUNTER — Encounter: Payer: Self-pay | Admitting: Gynecologic Oncology

## 2015-01-21 ENCOUNTER — Observation Stay
Admission: AD | Admit: 2015-01-21 | Discharge: 2015-01-22 | Disposition: A | Discharge status: Home / Self Care | Payer: Medicare Other | Source: Ambulatory Visit | Attending: Gynecologic Oncology | Admitting: Gynecologic Oncology

## 2015-01-21 ENCOUNTER — Ambulatory Visit (HOSPITAL_BASED_OUTPATIENT_CLINIC_OR_DEPARTMENT_OTHER): Payer: Medicare Other | Admitting: Gynecologic Oncology

## 2015-01-21 ENCOUNTER — Encounter (HOSPITAL_COMMUNITY): Payer: Self-pay | Admitting: Gynecologic Oncology

## 2015-01-21 ENCOUNTER — Encounter (INDEPENDENT_AMBULATORY_CARE_PROVIDER_SITE_OTHER): Payer: Self-pay

## 2015-01-21 VITALS — BP 145/78 | HR 117 | Temp 99.6°F | Resp 22

## 2015-01-21 DIAGNOSIS — R112 Nausea with vomiting, unspecified: Secondary | ICD-10-CM

## 2015-01-21 DIAGNOSIS — K259 Gastric ulcer, unspecified as acute or chronic, without hemorrhage or perforation: Secondary | ICD-10-CM | POA: Diagnosis not present

## 2015-01-21 DIAGNOSIS — Z880 Allergy status to penicillin: Secondary | ICD-10-CM | POA: Insufficient documentation

## 2015-01-21 DIAGNOSIS — J449 Chronic obstructive pulmonary disease, unspecified: Secondary | ICD-10-CM | POA: Diagnosis not present

## 2015-01-21 DIAGNOSIS — Z8541 Personal history of malignant neoplasm of cervix uteri: Secondary | ICD-10-CM | POA: Insufficient documentation

## 2015-01-21 DIAGNOSIS — D649 Anemia, unspecified: Secondary | ICD-10-CM | POA: Insufficient documentation

## 2015-01-21 DIAGNOSIS — F1210 Cannabis abuse, uncomplicated: Secondary | ICD-10-CM | POA: Diagnosis not present

## 2015-01-21 DIAGNOSIS — R600 Localized edema: Secondary | ICD-10-CM | POA: Diagnosis not present

## 2015-01-21 DIAGNOSIS — E876 Hypokalemia: Secondary | ICD-10-CM | POA: Insufficient documentation

## 2015-01-21 DIAGNOSIS — Z9889 Other specified postprocedural states: Secondary | ICD-10-CM | POA: Diagnosis present

## 2015-01-21 DIAGNOSIS — Z886 Allergy status to analgesic agent status: Secondary | ICD-10-CM | POA: Diagnosis not present

## 2015-01-21 DIAGNOSIS — Z90710 Acquired absence of both cervix and uterus: Secondary | ICD-10-CM | POA: Insufficient documentation

## 2015-01-21 DIAGNOSIS — J45909 Unspecified asthma, uncomplicated: Secondary | ICD-10-CM | POA: Insufficient documentation

## 2015-01-21 DIAGNOSIS — R05 Cough: Secondary | ICD-10-CM | POA: Diagnosis not present

## 2015-01-21 DIAGNOSIS — F17210 Nicotine dependence, cigarettes, uncomplicated: Secondary | ICD-10-CM | POA: Insufficient documentation

## 2015-01-21 DIAGNOSIS — K913 Postprocedural intestinal obstruction: Principal | ICD-10-CM | POA: Insufficient documentation

## 2015-01-21 DIAGNOSIS — Z881 Allergy status to other antibiotic agents status: Secondary | ICD-10-CM | POA: Diagnosis not present

## 2015-01-21 DIAGNOSIS — R5082 Postprocedural fever: Secondary | ICD-10-CM

## 2015-01-21 DIAGNOSIS — K6389 Other specified diseases of intestine: Secondary | ICD-10-CM | POA: Diagnosis not present

## 2015-01-21 DIAGNOSIS — R110 Nausea: Secondary | ICD-10-CM | POA: Diagnosis not present

## 2015-01-21 DIAGNOSIS — C539 Malignant neoplasm of cervix uteri, unspecified: Secondary | ICD-10-CM

## 2015-01-21 HISTORY — DX: Nausea with vomiting, unspecified: R11.2

## 2015-01-21 HISTORY — DX: Other specified postprocedural states: Z98.890

## 2015-01-21 LAB — COMPREHENSIVE METABOLIC PANEL
ALT: 166 U/L — AB (ref 14–54)
AST: 93 U/L — ABNORMAL HIGH (ref 15–41)
Albumin: 3.4 g/dL — ABNORMAL LOW (ref 3.5–5.0)
Alkaline Phosphatase: 109 U/L (ref 38–126)
Anion gap: 7 (ref 5–15)
BILIRUBIN TOTAL: 0.7 mg/dL (ref 0.3–1.2)
BUN: <5 mg/dL — ABNORMAL LOW (ref 6–20)
CO2: 23 mmol/L (ref 22–32)
Calcium: 8.6 mg/dL — ABNORMAL LOW (ref 8.9–10.3)
Chloride: 105 mmol/L (ref 101–111)
Creatinine, Ser: 0.62 mg/dL (ref 0.44–1.00)
GFR calc Af Amer: >60 mL/min (ref >60)
GFR calc non Af Amer: >60 mL/min (ref >60)
Glucose, Bld: 119 mg/dL — ABNORMAL HIGH (ref 65–99)
POTASSIUM: 3.3 mmol/L — AB (ref 3.5–5.1)
Sodium: 135 mmol/L (ref 135–145)
Total Protein: 7.1 g/dL (ref 6.5–8.1)

## 2015-01-21 LAB — CBC WITH DIFFERENTIAL/PLATELET
Basophils Absolute: 0 10*3/uL (ref 0.0–0.1)
Basophils Relative: 0 % (ref 0–1)
EOS ABS: 0.1 10*3/uL (ref 0.0–0.7)
Eosinophils Relative: 1 % (ref 0–5)
HEMATOCRIT: 30.3 % — AB (ref 36.0–46.0)
Hemoglobin: 9.2 g/dL — ABNORMAL LOW (ref 12.0–15.0)
LYMPHS PCT: 15 % (ref 12–46)
Lymphs Abs: 2 10*3/uL (ref 0.7–4.0)
MCH: 20.2 pg — AB (ref 26.0–34.0)
MCHC: 30.4 g/dL (ref 30.0–36.0)
MCV: 66.6 fL — AB (ref 78.0–100.0)
Monocytes Absolute: 1 10*3/uL (ref 0.1–1.0)
Monocytes Relative: 8 % (ref 3–12)
NEUTROS ABS: 9.9 10*3/uL — AB (ref 1.7–7.7)
Neutrophils Relative %: 76 % (ref 43–77)
Platelets: 198 10*3/uL (ref 150–400)
RBC: 4.55 MIL/uL (ref 3.87–5.11)
RDW: 20.8 % — AB (ref 11.5–15.5)
WBC: 13 10*3/uL — ABNORMAL HIGH (ref 4.0–10.5)

## 2015-01-21 LAB — LIPASE, BLOOD: Lipase: 17 U/L — ABNORMAL LOW (ref 22–51)

## 2015-01-21 MED ORDER — KCL IN DEXTROSE-NACL 20-5-0.45 MEQ/L-%-% IV SOLN
INTRAVENOUS | Status: DC
  Administered 2015-01-21: 17:00:00 via INTRAVENOUS
  Administered 2015-01-22: 125 mL/h via INTRAVENOUS
  Filled 2015-01-21 (×4): qty 1000

## 2015-01-21 MED ORDER — ONDANSETRON HCL 4 MG PO TABS: 4.0000 mg | ORAL_TABLET | Freq: Four times a day (QID) | ORAL | Status: DC | PRN

## 2015-01-21 MED ORDER — ENOXAPARIN SODIUM 40 MG/0.4ML ~~LOC~~ SOLN
40.0000 mg | SUBCUTANEOUS | Status: DC
  Administered 2015-01-21: 40 mg via SUBCUTANEOUS
  Filled 2015-01-21 (×2): qty 0.4

## 2015-01-21 MED ORDER — OXYCODONE-ACETAMINOPHEN 5-325 MG PO TABS
1.0000 | ORAL_TABLET | ORAL | Status: DC | PRN
  Administered 2015-01-21 – 2015-01-22 (×3): 1 via ORAL
  Filled 2015-01-21 (×3): qty 1

## 2015-01-21 MED ORDER — ONDANSETRON HCL 4 MG/2ML IJ SOLN: 4.0000 mg | Freq: Four times a day (QID) | INTRAMUSCULAR | Status: DC | PRN

## 2015-01-21 MED ORDER — ONDANSETRON HCL 8 MG PO TABS
8.0000 mg | ORAL_TABLET | Freq: Once | ORAL | Status: AC
  Administered 2015-01-21: 8 mg via ORAL

## 2015-01-21 MED ORDER — METRONIDAZOLE IN NACL 5-0.79 MG/ML-% IV SOLN
500.0000 mg | Freq: Three times a day (TID) | INTRAVENOUS | Status: DC
  Administered 2015-01-21 – 2015-01-22 (×3): 500 mg via INTRAVENOUS
  Filled 2015-01-21 (×4): qty 100

## 2015-01-21 MED ORDER — ALBUTEROL SULFATE (2.5 MG/3ML) 0.083% IN NEBU: 2.5000 mg | INHALATION_SOLUTION | Freq: Four times a day (QID) | RESPIRATORY_TRACT | Status: DC | PRN

## 2015-01-21 MED ORDER — POTASSIUM CHLORIDE 10 MEQ/100ML IV SOLN
10.0000 meq | INTRAVENOUS | Status: AC
  Administered 2015-01-21 (×3): 10 meq via INTRAVENOUS
  Filled 2015-01-21 (×4): qty 100

## 2015-01-21 MED ORDER — IOHEXOL 300 MG/ML  SOLN
100.0000 mL | Freq: Once | INTRAMUSCULAR | Status: AC | PRN
Start: 2015-01-21 — End: 2015-01-21
  Administered 2015-01-21: 100 mL via INTRAVENOUS

## 2015-01-21 MED ORDER — IBUPROFEN 200 MG PO TABS: 600.0000 mg | ORAL_TABLET | Freq: Four times a day (QID) | ORAL | Status: DC | PRN

## 2015-01-21 MED ORDER — IOHEXOL 300 MG/ML  SOLN
25.0000 mL | INTRAMUSCULAR | Status: AC
  Administered 2015-01-21: 25 mL via ORAL

## 2015-01-21 MED ORDER — HYDROMORPHONE HCL 1 MG/ML IJ SOLN
0.2000 mg | INTRAMUSCULAR | Status: DC | PRN
  Administered 2015-01-21 (×3): 0.6 mg via INTRAVENOUS
  Filled 2015-01-21 (×3): qty 1

## 2015-01-21 MED ORDER — SODIUM CHLORIDE 0.9 % IV BOLUS (SEPSIS)
1000.0000 mL | Freq: Once | INTRAVENOUS | Status: AC
  Administered 2015-01-21: 1000 mL via INTRAVENOUS

## 2015-01-21 MED ORDER — CIPROFLOXACIN IN D5W 200 MG/100ML IV SOLN
200.0000 mg | Freq: Two times a day (BID) | INTRAVENOUS | Status: DC
  Administered 2015-01-21 – 2015-01-22 (×2): 200 mg via INTRAVENOUS
  Filled 2015-01-21 (×3): qty 100

## 2015-01-21 NOTE — Progress Notes (Signed)
Follow Up Note: Gyn-Onc  Melissa Alvarez 39 y.o. female  CC:  Chief Complaint  Patient presents with  . Postop Nausea/Vomiting    Follow up    HPI:  Melissa Alvarez is a 39 y.o. year old G2P2 initially seen in consultation on 11/24/14 for cervical cancer. She then underwent a cold knife conization on 08/08/81 without complications. Her postoperative course was complicated by a trip to the ER for severe pain post cone. CT imaging showed no abscess or abnormality. Her white count was mildly elevated but she was afebrile. She was discharged from the ER and was prescribed a course of doxy and flagl for presumed cervicitis.  Due to continued pain postop, a CT was repeated on 12/23/14 resulting slightly more pelvic fluid, but no abscess. She has multiple fibroids, one of which appears to be degenerating.   Her final pathology from her cone revealed invasive moderately differentiated SCC measuring 1.32mm in greatest dimension with 1.15mm depth of invasion. There were multiple foci all less than 54mm. LVSI was present. 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 The post cone endocervical curretting was positive for fragments of squamous cell carcinoma.  On 01/19/15, she underwent a Robotic-assisted type II modified radical laparoscopic hysterectomy with bilateral salpingectomy right oophorectomy and bilateral pelvic lymphadenectomy by Dr. [name].  Her post-operative course was uneventful and she was discharged on POD 1 with no nausea, tolerating diet, with pain controlled on PRN medications.  Final pathology revealed: Diagnosis 1. Adnexa - ovary +/- tube, neoplastic, right - OVARIAN FIBROMA, 6.0 CM. - BENIGN FALLOPIAN TUBE. - THERE IS NO EVIDENCE OF MALIGNANCY. 2. Lymph nodes, regional resection, Right pelvic - THERE IS NO EVIDENCE OF CARCINOMA IN 8 OF 8 LYMPH NODES (0/8). 3. Lymph nodes, regional resection, Left pelvic lymph node - THERE IS NO EVIDENCE OF CARCINOMA IN 5 OF 5 LYMPH NODES (0/5). 4.  Uterus +/- tubes/ovaries, neoplastic, with left fallopian tube - CERVIX: SQUAMOUS CELL CARCINOMA IN SITU, DIFFUSE. INVASIVE CARCINOMA IS NOT DEFINITIVELY IDENTIFIED. - THE SURGICAL RESECTION MARGINS ARE NEGATIVE FOR CARCINOMA. - SEE COMMENT. - ENDOMETRIUM: BENIGN SECRETORY-TYPE ENDOMETRIUM. - DECIDUALIZATION OF THE STROMA, CONSISTENT WITH HORMONE EFFECT. - BENIGN ENDOMETRIOID-TYPE POLYP. - MYOMETRIUM: LEIOMYOMATA. - SEROSA: ADHESIONS. - LEFT FALLOPIAN TUBE: UNREMARKABLE.     Interval History:  She presents today to the [facility] lobby with her daughter with no appointment.  She spoke with the scheduler and stated she needs to see someone from GYN because she has been up all night with nausea, fevers, and vomiting all night.  She was actively vomiting in the lobby as well.  She stated she took a percocet last pm on an empty stomach and became nauseated.  She states she woke up at 1 am and was still nauseated.  She states her fever was 101.4 at 1 am.  She states her temperature has not went below 99.5 at home.  From time of discharge to 1 am, she reports 500 cc of dark orange urine.  Pain described as moderate and diffuse across the abdomen.  Last percocet use at 1 am.  Stating she feels "wheezy" at times with intermittent mild pain on the left upper abdomen.  Reports shortness of breath when laying flat.  Productive cough still present and described as thick and clear.  She states when she was vomiting, she had moderate pain within the right lower quadrant of the abdomen with distention in that area.  She states her abdomen feels "swollen."  Denies passing flatus  or having a bowel movement but burping frequently.  Poor PO intake.  Stating nausea worsens when drinking coke.  Denies vaginal bleeding.  No other concerns voiced.      Review of Systems Constitutional: Feels poorly.  Reports fever last pm.  Appetite decreased due to nausea.    Cardiovascular: Intermittent shortness of breath when laying flat.   No chest pain or edema.  Pulmonary: Productive cough with clear, thick sputum.  Intermittent wheezing-reporting history of asthma.  Gastrointestinal: Positive for nausea and vomiting.  No diarrhea. No bright red blood per rectum or change in bowel movement.  Genitourinary: Decreased urine output in foley.  No frequency, urgency, or dysuria. No vaginal bleeding or discharge.  Musculoskeletal: No myalgia or joint pain. Neurologic: No weakness, numbness, or change in gait.  Psychology: No depression, anxiety, or insomnia.  Current Meds:  Facility-Administered Encounter Medications as of 01/21/2015  Medication  . [COMPLETED] ondansetron (ZOFRAN) tablet 8 mg   Outpatient Encounter Prescriptions as of 01/21/2015  Medication Sig  . albuterol (PROVENTIL HFA;VENTOLIN HFA) 108 (90 BASE) MCG/ACT inhaler Inhale 2 puffs into the lungs once. (Patient taking differently: Inhale 2 puffs into the lungs every 6 (six) hours as needed for wheezing or shortness of breath. )  . oxyCODONE-acetaminophen (PERCOCET) 10-325 MG per tablet Take 1 tablet by mouth every 4 (four) hours as needed for pain (severe pain).    Allergy:  Allergies  Allergen Reactions  . Acetaminophen Hives    With codeine   . Other Hives    Clinical steroids except for solu medrol  . Penicillins Hives, Itching and Rash  . Doxycycline Hives, Itching and Rash  . Erythromycin Hives, Itching and Rash  . Motrin [Ibuprofen] Hives, Itching and Rash    Social Hx:   History   Social History  . Marital Status: Single    Spouse Name: N/A  . Number of Children: N/A  . Years of Education: N/A   Occupational History  . Not on file.   Social History Main Topics  . Smoking status: Current Every Day Smoker -- 2.00 packs/day for 24 years    Types: Cigarettes  . Smokeless tobacco: Never Used  . Alcohol Use: No     Comment: past hx of occas use   . Drug Use: Yes    Special: Marijuana     Comment: occasional, situational, to relieve stress  pt states does not currently use   . Sexual Activity: No   Other Topics Concern  . Not on file   Social History Narrative    Past Surgical Hx:  Past Surgical History  Procedure Laterality Date  . Appendectomy    . Cholecystectomy    . Tonsillectomy    . Cervical cone biopsy      x 6  . Cesarean section      x 2  . Shoulder surgery Left   . Cervical conization w/bx N/A 12/08/2014    Procedure: COLD KNIFE CONIZATION OF CERVIX;  Surgeon: [name], MD;  Location: [facility];  Service: Gynecology;  Laterality: N/A;  . Dilation and curettage of uterus    . Robotic assisted total hysterectomy N/A 01/19/2015    Procedure: ROBOTIC ASSISTED RADICAL HYSTERECTOMY RIGHT TUBE AND OVARY, LEFT TUBE;  Surgeon: [name], MD;  Location: [facility];  Service: Gynecology;  Laterality: N/A;  . Pelvic lymph node dissection N/A 01/19/2015    Procedure: PELVIC LYMPH NODE DISSECTION;  Surgeon: [name], MD;  Location: [facility];  Service: Gynecology;  Laterality: N/A;    Past Medical Hx:  Past Medical History  Diagnosis Date  . Asthma   . Cancer     cervical  . Anemia   . History of blood transfusion     last one 2 years ago   . COPD (chronic obstructive pulmonary disease)   . Gastric ulcer     x 5  . Difficulty sleeping   . Family history of adverse reaction to anesthesia     pts daughter awaken during surgery   . Shortness of breath dyspnea     exertion   . Numbness and tingling     right hip area following June 2016 surgery pt states has improved   . Postoperative nausea and vomiting 01/21/2015    Family Hx:  Family History  Problem Relation Age of Onset  . Cancer Mother   . Hypertension Mother   . Diabetes Mother   . CAD Mother   . Diabetes Other   . Hypertension Other   . Cancer Other   . CAD Other     Vitals:  Blood pressure 145/78, pulse 117, temperature 99.6 F (37.6 C), temperature source Oral, resp. rate 22, last menstrual period 12/08/2014, SpO2 100 %.  Physical Exam:   General: Well developed, well nourished female in mild distress. Alert and oriented x 3.  Wrapped in a quilt.  Appearing lethargic.  Neck: Supple without any enlargements.  Lymph node survey: No cervical, supraclavicular, or inguinal adenopathy.  Cardiovascular: Regular rate and rhythm. S1 and S2 normal. HR 87  Lungs: Scattered wheezes and mild crackles in the bases.  O2 100%.  Skin: No rashes or lesions present. Back: No CVA tenderness.  Abdomen: Abdomen mildly distended, soft, obese, non-tympanic on percussion, tender on palpation.  Hypoactive bowel sounds in all quadrants. No evidence of a fluid wave or abdominal masses. Lap sites without evidence of herniation or infection. Extremities: No bilateral cyanosis, edema, or clubbing.  Foley draining concentrated, light orange urine.  Around 200 cc in the bag.  Assessment/Plan:  Melissa Alvarez is a 39 year old female s/p robotic-assisted type II modified radical laparoscopic hysterectomy with bilateral salpingectomy right oophorectomy and bilateral pelvic lymphadenectomy on 01/19/15 by Dr. [name].  Situation discussed with Dr. Denman George.  Patient advised that it is recommended she be admitted to the hospital for further evaluation and monitoring.  Plan for chest xray and CT when inpatient as well.  Zofran 8 mg tablet given.  Patient stating she needs to go home first, get her assistance check at 2 pm in the mail, cash her check, give the money to her children, then she will return to be admitted.  Strongly advised against going home at this time per Dr. Denman George and myself.  Advised that leaving would delay our ability to diagnose and treat any complication with the potential for worsening symptoms or severe complications.  Patient states she has to leave and understands our concern.  Her daughter states she will make sure she comes back today.  Bed placement notified of direct admit and stated they would call the patient around 2 or 3 pm when her bed is  available.  Dr. Denman George and Dr. Delsa Sale aware of situation and pending admission.  Plan for IV fluids, CBC and Cmet, CT scan of abdomen and pelvis to rule out bowel or ureter injury/obstruction and Chest xray to rule out pneumonia, antiemetics ordered PO and IV, pain medication PO and IV if PO ineffective, I&O.  Plan will  be modified if necessary when results of labs, chest xray, or CT are available.         Melissa Vanosdol DEAL, NP 01/21/2015, 4:06 PM

## 2015-01-21 NOTE — Progress Notes (Signed)
Reviewed patient chart. Vitals: tachycardic, normotensive, febrile to 100.6 on admission, now defervesced. Labs: mild neutrophilia (13,000), hypokalemic, mildly elevated LFT's (with normal lipase). Imaging: CXR: within normal limits. CT abdo/pelvis (I have personally reviewed the images) revealed pneumatosis in abdominal wall (left) - likely trapped CO2 from surgery, no free air intraperitoneally, small volume of peritoneal postop fluid (no organized collection), no extravasation of GI or GU contrast suggestive of no apparent GI or GU visceral injury. Left "fluid collection" in pelvis appears most consistent with her left residual ovary (which had a corpus luteum identified intraoperatively). Distended colonic bowel with normal calibre small bowel consistent with postop ileus. A&P/ POD2 s/p robotic modified radical hysterctomy, RSO for cervical cancer (final stage IA1), admitted with postoperative fever, and apparent ileus (nausea and emesis).  No apparent source of infection. Unclear source for postop ileus.  Will check UA and culture to rule out GU source of infection.  Continue foley due to radical parametrial dissection and need for bladder rest due to concern for risk for urinary retention. Empiric cipro and flagyl started. Replace K+ with 51meq for hypokalemia. IVF bolus then maintenance fluid. Clear liquids. Will make NPO and place NGT if she has continued emesis. Prn pain medication. [name], MD

## 2015-01-21 NOTE — Patient Instructions (Signed)
We are recommending admission to the hospital at this time for further evaluation and management.  Since you have decided to go home before and return at 3 pm against our medical advise, please monitor for a call from bed placement about your room placement on [place].

## 2015-01-21 NOTE — H&P (Signed)
Gynecologic Oncology   HPI: Melissa Alvarez is a 39 y.o. year old G2P2 initially seen in consultation on 11/24/14 for cervical cancer. She then underwent a cold knife conization on 12/03/67 without complications. Her postoperative course was complicated by a trip to the ER for severe pain post cone. CT imaging showed no abscess or abnormality. Her white count was mildly elevated but she was afebrile. She was discharged from the ER and was prescribed a course of doxy and flagl for presumed cervicitis. Due to continued pain postop, a CT was repeated on 12/23/14 resulting slightly more pelvic fluid, but no abscess. She has multiple fibroids, one of which appears to be degenerating.   Her final pathology from her cone revealed invasive moderately differentiated SCC measuring 1.35mm in greatest dimension with 1.71mm depth of invasion. There were multiple foci all less than 58mm. LVSI was present. 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 The post cone endocervical curretting was positive for fragments of squamous cell carcinoma.  On 01/19/15, she underwent a Robotic-assisted type II modified radical laparoscopic hysterectomy with bilateral salpingectomy right oophorectomy and bilateral pelvic lymphadenectomy by Dr. [name]. Her post-operative course was uneventful and she was discharged on POD 1 with no nausea, tolerating diet, with pain controlled on PRN medications. Final pathology revealed: Diagnosis 1. Adnexa - ovary +/- tube, neoplastic, right - OVARIAN FIBROMA, 6.0 CM. - BENIGN FALLOPIAN TUBE. - THERE IS NO EVIDENCE OF MALIGNANCY. 2. Lymph nodes, regional resection, Right pelvic - THERE IS NO EVIDENCE OF CARCINOMA IN 8 OF 8 LYMPH NODES (0/8). 3. Lymph nodes, regional resection, Left pelvic lymph node - THERE IS NO EVIDENCE OF CARCINOMA IN 5 OF 5 LYMPH NODES (0/5). 4. Uterus +/- tubes/ovaries, neoplastic, with left fallopian tube - CERVIX: SQUAMOUS CELL CARCINOMA IN SITU, DIFFUSE. INVASIVE  CARCINOMA IS NOT DEFINITIVELY IDENTIFIED. - THE SURGICAL RESECTION MARGINS ARE NEGATIVE FOR CARCINOMA. - SEE COMMENT. - ENDOMETRIUM: BENIGN SECRETORY-TYPE ENDOMETRIUM. - DECIDUALIZATION OF THE STROMA, CONSISTENT WITH HORMONE EFFECT. - BENIGN ENDOMETRIOID-TYPE POLYP. - MYOMETRIUM: LEIOMYOMATA. - SEROSA: ADHESIONS. - LEFT FALLOPIAN TUBE: UNREMARKABLE.   Interval History: She is currently admitted for further evaluation of post-operative nausea/vomiting and fever.  She presented today to the [facility] lobby with her daughter with no appointment. She spoke with the scheduler and stated she needs to see someone from GYN because she has been up all night with nausea, fevers, and vomiting all night. She was actively vomiting in the lobby as well. She stated she took a percocet last pm on an empty stomach and became nauseated. She states she woke up at 1 am and was still nauseated. She states her fever was 101.4 at 1 am. She states her temperature has not went below 99.5 at home. From time of discharge to 1 am, she reports 500 cc of dark orange urine. Pain described as moderate and diffuse across the abdomen. Last percocet use at 1 am. Stating she feels "wheezy" at times with intermittent mild pain on the left upper abdomen. Reports shortness of breath when laying flat. Productive cough still present and described as thick and clear. She states when she was vomiting, she had moderate pain within the right lower quadrant of the abdomen with distention in that area. She states her abdomen feels "swollen." Denies passing flatus or having a bowel movement but burping frequently. Poor PO intake. Stating nausea worsens when drinking coke. Denies vaginal bleeding. No other concerns voiced.   Review of Systems Constitutional: Feels poorly. Reports fever last pm. Appetite  decreased due to nausea.  Cardiovascular: Intermittent shortness of breath when laying flat. No chest pain or edema.   Pulmonary: Productive cough with clear, thick sputum. Intermittent wheezing-reporting history of asthma.  Gastrointestinal: Positive for nausea and vomiting. No diarrhea. No bright red blood per rectum or change in bowel movement.  Genitourinary: Decreased urine output in foley. No frequency, urgency, or dysuria. No vaginal bleeding or discharge.  Musculoskeletal: No myalgia or joint pain. Neurologic: No weakness, numbness, or change in gait.  Psychology: No depression, anxiety, or insomnia.  Current Meds:  Facility-Administered Encounter Medications as of 01/21/2015  Medication  . [COMPLETED] ondansetron (ZOFRAN) tablet 8 mg   Outpatient Encounter Prescriptions as of 01/21/2015  Medication Sig  . albuterol (PROVENTIL HFA;VENTOLIN HFA) 108 (90 BASE) MCG/ACT inhaler Inhale 2 puffs into the lungs once. (Patient taking differently: Inhale 2 puffs into the lungs every 6 (six) hours as needed for wheezing or shortness of breath. )  . oxyCODONE-acetaminophen (PERCOCET) 10-325 MG per tablet Take 1 tablet by mouth every 4 (four) hours as needed for pain (severe pain).    Allergy:  Allergies  Allergen Reactions  . Acetaminophen Hives    With codeine   . Other Hives    Clinical steroids except for solu medrol  . Penicillins Hives, Itching and Rash  . Doxycycline Hives, Itching and Rash  . Erythromycin Hives, Itching and Rash  . Motrin [Ibuprofen] Hives, Itching and Rash    Social Hx:  History   Social History  . Marital Status: Single    Spouse Name: N/A  . Number of Children: N/A  . Years of Education: N/A   Occupational History  . Not on file.   Social History Main Topics  . Smoking status: Current Every Day Smoker -- 2.00 packs/day for 24 years    Types: Cigarettes  . Smokeless tobacco: Never Used  . Alcohol Use: No     Comment: past hx of occas use   . Drug Use: Yes     Special: Marijuana     Comment: occasional, situational, to relieve stress pt states does not currently use   . Sexual Activity: No   Other Topics Concern  . Not on file   Social History Narrative    Past Surgical Hx:  Past Surgical History  Procedure Laterality Date  . Appendectomy    . Cholecystectomy    . Tonsillectomy    . Cervical cone biopsy      x 6  . Cesarean section      x 2  . Shoulder surgery Left   . Cervical conization w/bx N/A 12/08/2014    Procedure: COLD KNIFE CONIZATION OF CERVIX; Surgeon: [name], MD; Location: [facility]; Service: Gynecology; Laterality: N/A;  . Dilation and curettage of uterus    . Robotic assisted total hysterectomy N/A 01/19/2015    Procedure: ROBOTIC ASSISTED RADICAL HYSTERECTOMY RIGHT TUBE AND OVARY, LEFT TUBE; Surgeon: [name], MD; Location: [facility]; Service: Gynecology; Laterality: N/A;  . Pelvic lymph node dissection N/A 01/19/2015    Procedure: PELVIC LYMPH NODE DISSECTION; Surgeon: [name], MD; Location: [facility]; Service: Gynecology; Laterality: N/A;    Past Medical Hx:  Past Medical History  Diagnosis Date  . Asthma   . Cancer     cervical  . Anemia   . History of blood transfusion     last one 2 years ago   . COPD (chronic obstructive pulmonary disease)   . Gastric ulcer     x  5  . Difficulty sleeping   . Family history of adverse reaction to anesthesia     pts daughter awaken during surgery   . Shortness of breath dyspnea     exertion   . Numbness and tingling     right hip area following June 2016 surgery pt states has improved   . Postoperative nausea and vomiting 01/21/2015    Family Hx:  Family History  Problem Relation Age of Onset  . Cancer Mother   . Hypertension Mother   . Diabetes Mother   . CAD Mother   . Diabetes Other   .  Hypertension Other   . Cancer Other   . CAD Other     Vitals: Blood pressure 145/78, pulse 117, temperature 99.6 F (37.6 C), temperature source Oral, resp. rate 22, last menstrual period 12/08/2014, SpO2 100 %.  Physical Exam (7/21 at 10am):  General: Well developed, well nourished female in mild distress. Alert and oriented x 3. Wrapped in a quilt. Appearing lethargic.  Neck: Supple without any enlargements.  Lymph node survey: No cervical, supraclavicular, or inguinal adenopathy.  Cardiovascular: Regular rate and rhythm. S1 and S2 normal. HR 87  Lungs: Scattered wheezes and mild crackles in the bases. O2 100%.  Skin: No rashes or lesions present. Back: No CVA tenderness.  Abdomen: Abdomen mildly distended, soft, obese, non-tympanic on percussion, tender on palpation. Hypoactive bowel sounds in all quadrants. No evidence of a fluid wave or abdominal masses. Lap sites without evidence of herniation or infection. Extremities: No bilateral cyanosis, edema, or clubbing.  Foley draining concentrated, light orange urine. Around 200 cc in the bag.  Assessment/Plan: Melissa Alvarez is a 39 year old female s/p robotic-assisted type II modified radical laparoscopic hysterectomy with bilateral salpingectomy right oophorectomy and bilateral pelvic lymphadenectomy on 01/19/15 by Dr. [name]. Plan for IV fluids, CBC and Cmet, CT scan of abdomen and pelvis to rule out bowel or ureter injury/obstruction and Chest xray to rule out pneumonia, antiemetics ordered PO and IV, pain medication PO and IV if PO ineffective, I&O. Plan will be modified if necessary when results of labs, chest xray, or CT are available.    CROSS, MELISSA DEAL, NP 01/21/2015, 4:06 PM              I saw and examined the patient, personally reviewed her CT films and reviewed her labs.   I agree with the assessment and plan by NP [facility]. Melissa Alvarez is postop day 2 status post robotic  radical hysterectomy RSO. Surgery was uncomplicated. She re-presented with severe nausea and vomiting, low-grade fever to 100.6, oliguria, tachycardia, and abdominal pain. She denied passing flatus.  On evaluation there was no localizing signs for infection. Her white blood cell, white count was mildly elevated at 13,000. She did not have a left shift. Her hemoglobin was appropriate for postop and stable. Chest x-ray was unremarkable. CT of the abdomen and pelvis showed no apparent abscess, or visceral injury to the urologic or GI tract. There was no major fluid collection. The left sided pelvic collection that they commented on most likely represented the residual left ovary and ovarian cyst.  Recommend admission to hospital with continued Foley drainage, empiric IV Rx with Cipro and Flagyl, repeat labs, IV hydration and bolus for dehydration, repletion of potassium for hypokalemia, analgesia.  We'll give Lovenox prophylaxis. We'll advance diet as tolerated.

## 2015-01-22 DIAGNOSIS — R112 Nausea with vomiting, unspecified: Secondary | ICD-10-CM

## 2015-01-22 DIAGNOSIS — K913 Postprocedural intestinal obstruction: Secondary | ICD-10-CM | POA: Diagnosis not present

## 2015-01-22 LAB — BASIC METABOLIC PANEL
Anion gap: 6 (ref 5–15)
CHLORIDE: 105 mmol/L (ref 101–111)
CO2: 23 mmol/L (ref 22–32)
Calcium: 8.5 mg/dL — ABNORMAL LOW (ref 8.9–10.3)
Creatinine, Ser: 0.52 mg/dL (ref 0.44–1.00)
GFR calc Af Amer: >60 mL/min (ref >60)
GFR calc non Af Amer: >60 mL/min (ref >60)
GLUCOSE: 105 mg/dL — AB (ref 65–99)
Potassium: 3.4 mmol/L — ABNORMAL LOW (ref 3.5–5.1)
SODIUM: 134 mmol/L — AB (ref 135–145)

## 2015-01-22 LAB — CBC WITH DIFFERENTIAL/PLATELET
BASOS ABS: 0 10*3/uL (ref 0.0–0.1)
Basophils Relative: 0 % (ref 0–1)
EOS PCT: 3 % (ref 0–5)
Eosinophils Absolute: 0.3 10*3/uL (ref 0.0–0.7)
HEMATOCRIT: 29.3 % — AB (ref 36.0–46.0)
HEMOGLOBIN: 8.9 g/dL — AB (ref 12.0–15.0)
LYMPHS PCT: 15 % (ref 12–46)
Lymphs Abs: 1.6 10*3/uL (ref 0.7–4.0)
MCH: 20.5 pg — ABNORMAL LOW (ref 26.0–34.0)
MCHC: 30.4 g/dL (ref 30.0–36.0)
MCV: 67.4 fL — ABNORMAL LOW (ref 78.0–100.0)
Monocytes Absolute: 1 10*3/uL (ref 0.1–1.0)
Monocytes Relative: 9 % (ref 3–12)
NEUTROS PCT: 73 % (ref 43–77)
Neutro Abs: 8 10*3/uL — ABNORMAL HIGH (ref 1.7–7.7)
PLATELETS: 182 10*3/uL (ref 150–400)
RBC: 4.35 MIL/uL (ref 3.87–5.11)
RDW: 21.1 % — ABNORMAL HIGH (ref 11.5–15.5)
WBC: 10.9 10*3/uL — AB (ref 4.0–10.5)

## 2015-01-22 MED ORDER — ONDANSETRON HCL 4 MG PO TABS: 4.0000 mg | ORAL_TABLET | Freq: Four times a day (QID) | ORAL | Status: DC | PRN

## 2015-01-22 NOTE — Discharge Instructions (Signed)

## 2015-01-22 NOTE — Discharge Summary (Signed)
Physician Discharge Summary  Patient ID: Melissa Alvarez MRN: [id number] DOB/AGE: September 06, 1975 39 y.o.  Admit date: 01/21/2015 Discharge date: 01/22/2015  Admission Diagnoses: <principal problem not specified>  Discharge Diagnoses:  Active Problems:   Postoperative nausea and vomiting   Discharged Condition: good  Hospital Course: The patient was admitted to the hospital on postoperative day 2 (01/21/2015) after a robotic modified radical hysterectomy and right salpingo-oophorectomy and bilateral pelvic lymph node dissection. She was admitted with symptoms of low-grade fever, nausea or emesis, abdominal pain. Laboratory evaluation revealed a mildly elevated white blood cell count but no other abnormalities other than hypokalemia. CT of the abdomen and pelvis failed to show evidence of serious intraperitoneal process such as damage to a visceral structure, abscess, or postoperative hernia. It also showed normal caliber small bowel loops, but very distended colon with retained gas. Chest x-ray was unremarkable.  She was empirically started on Cipro and Flagyl and a biotics IV. A clear liquid diet was written for. She was provided with an IV fluid bolus and IV fluid maintenance dosing. Her potassium was repleted.  She began passing flatus on the evening of hospital day 1 and immediately began feeling better. On the morning of hospital day to she was requesting discharge to home and she was feeling so well. Her pain was well controlled with oral G6PD reaches tolerating clear liquid diet. She had not had emesis for approximately 24 hours.  She still has a Foley catheter in situ we will remove this an attempt voiding trial. The Foley catheter had been maintaining postoperative after radical parametrial dissection and increased risk for urinary retention. If she fails her voiding trial in hospital Foley will be replaced and she'll go home with a Foley for another week and be seen in the outpatient  clinic for repeat voiding trial. Given her lack of findings for a source of infection I will not send her home on outpatient oral anabolic regimen.  Consults: None  Significant Diagnostic Studies: labs:  CBC    Component Value Date/Time   WBC 10.9* 01/22/2015 0930   RBC 4.35 01/22/2015 0930   HGB 8.9* 01/22/2015 0930   HCT 29.3* 01/22/2015 0930   PLT 182 01/22/2015 0930   MCV 67.4* 01/22/2015 0930   MCH 20.5* 01/22/2015 0930   MCHC 30.4 01/22/2015 0930   RDW 21.1* 01/22/2015 0930   LYMPHSABS 1.6 01/22/2015 0930   MONOABS 1.0 01/22/2015 0930   EOSABS 0.3 01/22/2015 0930   BASOSABS 0.0 01/22/2015 0930    CMP Latest Ref Rng 01/22/2015 01/21/2015 01/20/2015  Glucose 65 - 99 mg/dL 105(H) 119(H) 106(H)  BUN 6 - 20 mg/dL <5(L) <5(L) <5(L)  Creatinine 0.44 - 1.00 mg/dL 0.52 0.62 0.59  Sodium 135 - 145 mmol/L 134(L) 135 137  Potassium 3.5 - 5.1 mmol/L 3.4(L) 3.3(L) 3.5  Chloride 101 - 111 mmol/L 105 105 106  CO2 22 - 32 mmol/L 23 23 24   Calcium 8.9 - 10.3 mg/dL 8.5(L) 8.6(L) 8.4(L)  Total Protein 6.5 - 8.1 g/dL - 7.1 -  Total Bilirubin 0.3 - 1.2 mg/dL - 0.7 -  Alkaline Phos 38 - 126 U/L - 109 -  AST 15 - 41 U/L - 93(H) -  ALT 14 - 54 U/L - 166(H) -   CT abdo/pelvis with PO and IV contrast 01/21/15:  1. Extensive postoperative changes are identified within the pelvis compatible with recent hysterectomy. 2. Mild gaseous distension of the large bowel loops, likely postoperative ileus. 3. Edema and fluid within the  pelvis is nonspecific in the early postoperative time frame. Within the left side of pelvis there is a fluid collection measuring approximately 5.3 cm. Nonspecific. 4. Left lower lobe pulmonary nodule measuring 6 mm. Nonspecific. Attention on follow-up imaging recommended.   Treatments: IV hydration, antibiotics: Cipro and metronidazole and analgesia: Dilaudid  Discharge Exam: Blood pressure 111/65, pulse 72, temperature 99.4 F (37.4 C), temperature source Oral, resp.  rate 20, height 5\' 6"  (1.676 m), weight 189 lb 15.9 oz (86.18 kg), last menstrual period 12/08/2014, SpO2 98 %. General appearance: alert and cooperative Resp: clear to auscultation bilaterally Cardio: regular rate and rhythm, S1, S2 normal, no murmur, click, rub or gallop GI: soft, non-tender; bowel sounds normal; no masses,  no organomegaly and + bowel sounds, mildly distended Incision/Wound: clean, dry and intact  Disposition: 01-Home or Self Care  Discharge Instructions    (HEART FAILURE PATIENTS) Call MD:  Anytime you have any of the following symptoms: 1) 3 pound weight gain in 24 hours or 5 pounds in 1 week 2) shortness of breath, with or without a dry hacking cough 3) swelling in the hands, feet or stomach 4) if you have to sleep on extra pillows at night in order to breathe.    Complete by:  As directed      Call MD for:  difficulty breathing, headache or visual disturbances    Complete by:  As directed      Call MD for:  extreme fatigue    Complete by:  As directed      Call MD for:  hives    Complete by:  As directed      Call MD for:  persistant dizziness or light-headedness    Complete by:  As directed      Call MD for:  persistant nausea and vomiting    Complete by:  As directed      Call MD for:  redness, tenderness, or signs of infection (pain, swelling, redness, odor or green/yellow discharge around incision site)    Complete by:  As directed      Call MD for:  severe uncontrolled pain    Complete by:  As directed      Call MD for:  temperature >100.4    Complete by:  As directed      Diet - low sodium heart healthy    Complete by:  As directed      Diet general    Complete by:  As directed      Driving Restrictions    Complete by:  As directed   No driving for 7 days or until off narcotic pain medication     Increase activity slowly    Complete by:  As directed      Remove dressing in 24 hours    Complete by:  As directed      Sexual Activity Restrictions     Complete by:  As directed   No intercourse for 6 weeks            Medication List    TAKE these medications        albuterol 108 (90 BASE) MCG/ACT inhaler  Commonly known as:  PROVENTIL HFA;VENTOLIN HFA  Inhale 2 puffs into the lungs once.     ondansetron 4 MG tablet  Commonly known as:  ZOFRAN  Take 1 tablet (4 mg total) by mouth every 6 (six) hours as needed for nausea.     oxyCODONE-acetaminophen 10-325 MG per tablet  Commonly known as:  PERCOCET  Take 1 tablet by mouth every 4 (four) hours as needed for pain (severe pain).         Signed: [name] 01/22/2015, 11:12 AM

## 2015-01-28 ENCOUNTER — Ambulatory Visit: Payer: Medicare Other | Admitting: Gynecologic Oncology

## 2015-02-01 ENCOUNTER — Telehealth: Payer: Self-pay | Admitting: *Deleted

## 2015-02-01 ENCOUNTER — Encounter (HOSPITAL_COMMUNITY): Payer: Self-pay | Admitting: Emergency Medicine

## 2015-02-01 ENCOUNTER — Emergency Department (HOSPITAL_COMMUNITY)
Admission: EM | Admit: 2015-02-01 | Discharge: 2015-02-01 | Discharge status: Against Medical Advice | Payer: Medicare Other | Attending: Emergency Medicine | Admitting: Emergency Medicine

## 2015-02-01 ENCOUNTER — Emergency Department (HOSPITAL_COMMUNITY): Payer: Medicare Other

## 2015-02-01 DIAGNOSIS — Z720 Tobacco use: Secondary | ICD-10-CM | POA: Insufficient documentation

## 2015-02-01 DIAGNOSIS — Z79899 Other long term (current) drug therapy: Secondary | ICD-10-CM | POA: Diagnosis not present

## 2015-02-01 DIAGNOSIS — R1030 Lower abdominal pain, unspecified: Secondary | ICD-10-CM

## 2015-02-01 DIAGNOSIS — J449 Chronic obstructive pulmonary disease, unspecified: Secondary | ICD-10-CM | POA: Diagnosis not present

## 2015-02-01 DIAGNOSIS — Z9049 Acquired absence of other specified parts of digestive tract: Secondary | ICD-10-CM | POA: Insufficient documentation

## 2015-02-01 DIAGNOSIS — Z8669 Personal history of other diseases of the nervous system and sense organs: Secondary | ICD-10-CM | POA: Diagnosis not present

## 2015-02-01 DIAGNOSIS — Z8719 Personal history of other diseases of the digestive system: Secondary | ICD-10-CM | POA: Diagnosis not present

## 2015-02-01 DIAGNOSIS — Z8541 Personal history of malignant neoplasm of cervix uteri: Secondary | ICD-10-CM | POA: Diagnosis not present

## 2015-02-01 DIAGNOSIS — Z862 Personal history of diseases of the blood and blood-forming organs and certain disorders involving the immune mechanism: Secondary | ICD-10-CM | POA: Insufficient documentation

## 2015-02-01 DIAGNOSIS — Z9089 Acquired absence of other organs: Secondary | ICD-10-CM | POA: Insufficient documentation

## 2015-02-01 DIAGNOSIS — Z880 Allergy status to penicillin: Secondary | ICD-10-CM | POA: Insufficient documentation

## 2015-02-01 DIAGNOSIS — R1031 Right lower quadrant pain: Secondary | ICD-10-CM | POA: Insufficient documentation

## 2015-02-01 DIAGNOSIS — M79606 Pain in leg, unspecified: Secondary | ICD-10-CM | POA: Insufficient documentation

## 2015-02-01 LAB — BASIC METABOLIC PANEL
ANION GAP: 6 (ref 5–15)
BUN: 8 mg/dL (ref 6–20)
CHLORIDE: 107 mmol/L (ref 101–111)
CO2: 26 mmol/L (ref 22–32)
Calcium: 8.9 mg/dL (ref 8.9–10.3)
Creatinine, Ser: 0.72 mg/dL (ref 0.44–1.00)
GFR calc Af Amer: >60 mL/min (ref >60)
GFR calc non Af Amer: >60 mL/min (ref >60)
GLUCOSE: 97 mg/dL (ref 65–99)
POTASSIUM: 3.8 mmol/L (ref 3.5–5.1)
Sodium: 139 mmol/L (ref 135–145)

## 2015-02-01 LAB — CBC WITH DIFFERENTIAL/PLATELET
Basophils Absolute: 0.1 10*3/uL (ref 0.0–0.1)
Basophils Relative: 1 % (ref 0–1)
Eosinophils Absolute: 0.9 10*3/uL — ABNORMAL HIGH (ref 0.0–0.7)
Eosinophils Relative: 9 % — ABNORMAL HIGH (ref 0–5)
HCT: 30.5 % — ABNORMAL LOW (ref 36.0–46.0)
Hemoglobin: 8.8 g/dL — ABNORMAL LOW (ref 12.0–15.0)
Lymphocytes Relative: 21 % (ref 12–46)
Lymphs Abs: 2.2 10*3/uL (ref 0.7–4.0)
MCH: 20.2 pg — AB (ref 26.0–34.0)
MCHC: 28.9 g/dL — ABNORMAL LOW (ref 30.0–36.0)
MCV: 70 fL — AB (ref 78.0–100.0)
Monocytes Absolute: 0.6 10*3/uL (ref 0.1–1.0)
Monocytes Relative: 6 % (ref 3–12)
NEUTROS ABS: 6.5 10*3/uL (ref 1.7–7.7)
NEUTROS PCT: 63 % (ref 43–77)
Platelets: 436 10*3/uL — ABNORMAL HIGH (ref 150–400)
RBC: 4.36 MIL/uL (ref 3.87–5.11)
RDW: 21.6 % — AB (ref 11.5–15.5)
WBC: 10.3 10*3/uL (ref 4.0–10.5)

## 2015-02-01 MED ORDER — MORPHINE SULFATE 4 MG/ML IJ SOLN
4.0000 mg | Freq: Once | INTRAMUSCULAR | Status: DC
  Filled 2015-02-01: qty 1

## 2015-02-01 MED ORDER — OXYCODONE-ACETAMINOPHEN 5-325 MG PO TABS: 1.0000 | ORAL_TABLET | Freq: Four times a day (QID) | ORAL | Status: DC | PRN

## 2015-02-01 MED ORDER — ONDANSETRON HCL 4 MG/2ML IJ SOLN
4.0000 mg | Freq: Once | INTRAMUSCULAR | Status: DC
  Filled 2015-02-01: qty 2

## 2015-02-01 NOTE — ED Notes (Signed)
Pt found RN in hall and stated that she just did not want to be here anymore and that she could lay around at home and she was going to leave.  RN encouraged her to stay and reiterated that another RN would be in to attempt another IV.  Pt refused.  RN notified Provider.

## 2015-02-01 NOTE — Telephone Encounter (Signed)
Patient returned call. Patient states "I have had pain for a few days from my right hip down to my toes and my leg sometimes goes numb. I also have 2 knots under my skin - one in my groin and one on the top of my vagina." Patient denies shaving the area (last time she shaved was the week prior to surgery), fever, or the bumps draining any fluid or pus. Offered patient appointment on Wednesday, 02/03/15, with Dr. Alycia Rossetti - patient states she cannot wait that long and that she is going to go to the ED. Advised patient that if the pain is that severe that she should report to the emergency room and instructed patient to please call our office pain with any other concerns.

## 2015-02-01 NOTE — ED Provider Notes (Signed)
CSN: [id number]     Arrival date & time 02/01/15  1824 History   First MD Initiated Contact with Patient 02/01/15 1921     Chief Complaint  Patient presents with  . Leg Pain     (Consider location/radiation/quality/duration/timing/severity/associated sxs/prior Treatment) HPI Comments: Daniesha Driver is a 39 year old female s/p robotic-assisted type II modified radical laparoscopic hysterectomy with bilateral salpingectomy right oophorectomy and bilateral pelvic lymphadenectomy on 01/19/15 by Dr. [name] presents today with pain of the right groin.  She states that the pain has been present ever since the surgery.  She was seen on 01/21/15 for similar pain and had a negative CT scan at that time.  She states that she was taking Percocet for the pain, but does not feel that it helps.  Pain is worse with palpation.  She also feels like there are two "bumps" in the area.  She denies fever, chills, nausea, vomiting, diarrhea, constipation, or urinary symptoms.    Patient is a 39 y.o. female presenting with leg pain. The history is provided by the patient.  Leg Pain   Past Medical History  Diagnosis Date  . Asthma   . Cancer     cervical  . Anemia   . History of blood transfusion     last one 2 years ago   . COPD (chronic obstructive pulmonary disease)   . Gastric ulcer     x 5  . Difficulty sleeping   . Family history of adverse reaction to anesthesia     pts daughter awaken during surgery   . Shortness of breath dyspnea     exertion   . Numbness and tingling     right hip area following June 2016 surgery pt states has improved   . Postoperative nausea and vomiting 01/21/2015   Past Surgical History  Procedure Laterality Date  . Appendectomy    . Cholecystectomy    . Tonsillectomy    . Cervical cone biopsy      x 6  . Cesarean section      x 2  . Shoulder surgery Left   . Cervical conization w/bx N/A 12/08/2014    Procedure: COLD KNIFE CONIZATION OF CERVIX;  Surgeon: [name], MD;  Location: [facility];  Service: Gynecology;  Laterality: N/A;  . Dilation and curettage of uterus    . Robotic assisted total hysterectomy N/A 01/19/2015    Procedure: ROBOTIC ASSISTED RADICAL HYSTERECTOMY RIGHT TUBE AND OVARY, LEFT TUBE;  Surgeon: [name], MD;  Location: [facility];  Service: Gynecology;  Laterality: N/A;  . Pelvic lymph node dissection N/A 01/19/2015    Procedure: PELVIC LYMPH NODE DISSECTION;  Surgeon: [name], MD;  Location: [facility];  Service: Gynecology;  Laterality: N/A;   Family History  Problem Relation Age of Onset  . Cancer Mother   . Hypertension Mother   . Diabetes Mother   . CAD Mother   . Diabetes Other   . Hypertension Other   . Cancer Other   . CAD Other    History  Substance Use Topics  . Smoking status: Current Every Day Smoker -- 2.00 packs/day for 24 years    Types: Cigarettes  . Smokeless tobacco: Never Used  . Alcohol Use: No     Comment: past hx of occas use    OB History    Gravida Para Term Preterm AB TAB SAB Ectopic Multiple Living   2 2 2  Review of Systems  All other systems reviewed and are negative.     Allergies  Acetaminophen; Other; Penicillins; Doxycycline; Erythromycin; and Motrin  Home Medications   Prior to Admission medications   Medication Sig Start Date End Date Taking? Authorizing Provider  albuterol (PROVENTIL HFA;VENTOLIN HFA) 108 (90 BASE) MCG/ACT inhaler Inhale 2 puffs into the lungs once. Patient taking differently: Inhale 2 puffs into the lungs every 6 (six) hours as needed for wheezing or shortness of breath.  06/25/14  Yes Linton Flemings, MD  ondansetron (ZOFRAN) 4 MG tablet Take 1 tablet (4 mg total) by mouth every 6 (six) hours as needed for nausea. Patient not taking: Reported on 02/01/2015 01/22/15   [name], MD  oxyCODONE-acetaminophen (PERCOCET) 10-325 MG per tablet Take 1 tablet by mouth every 4 (four) hours as needed for pain (severe pain). Patient not taking: Reported on 02/01/2015  01/20/15   Dorothyann Gibbs, NP   BP 138/91 mmHg  Pulse 92  Temp(Src) 98.4 F (36.9 C) (Oral)  Resp 18  SpO2 100%  LMP 12/08/2014 (Exact Date) Physical Exam  Constitutional: She appears well-developed and well-nourished.  HENT:  Head: Normocephalic and atraumatic.  Mouth/Throat: Oropharynx is clear and moist.  Neck: Normal range of motion. Neck supple.  Cardiovascular: Normal rate, regular rhythm and normal heart sounds.   Pulmonary/Chest: Effort normal and breath sounds normal.  Abdominal: Soft. Bowel sounds are normal. She exhibits no distension and no mass. There is tenderness. There is no rebound and no guarding.    Musculoskeletal: Normal range of motion.       Right hip: She exhibits normal range of motion, no tenderness, no bony tenderness and no swelling.  Neurological: She is alert.  Skin: Skin is warm and dry.  Psychiatric: She has a normal mood and affect.  Nursing note and vitals reviewed.   ED Course  Procedures (including critical care time) Labs Review Labs Reviewed - No data to display  Imaging Review No results found.   EKG Interpretation None     9:55 PM Patient informed staff that she was leaving.  Patient reports that she does not want to stay for blood work or CT scan.  She states that she has an appointment with her surgeon on Wednesday and feels that she can wait until then.  Apparently RN was having difficulty obtaining IV access and getting blood work.   MDM   Final diagnoses:  None   Patient with a recent  robotic-assisted type II modified radical laparoscopic hysterectomy with bilateral salpingectomy right oophorectomy and bilateral pelvic lymphadenectomy on 7/19 presents to the ED with pain of the right groin.  No obvious signs of infection on exam.  Patient was seen for similar pain on 01/21/15 and had a negative CT ab/pelvis done at that time.  Discussed patient with Dr. Tyrone Nine.  Plan was to obtain CT ab/pelvis and labs and then notify the  patient's surgeon that she was in the ED.  However, patient left AMA prior to blood work and CT scan.  She was explained risks and demonstrated understanding.  Patient discharged AMA.  Return precautions given.    Hyman Bible, PA-C 02/03/15 [place], DO 02/03/15 2059

## 2015-02-01 NOTE — ED Notes (Signed)
RN attempted 2 IV starts. Pt tolerated both poorly.

## 2015-02-01 NOTE — ED Notes (Addendum)
Pt complaint of leg pain 7/22 post hysterectomy/right ovary removal on 7/19. Pt reports lump noted right groin as well.

## 2015-02-01 NOTE — Telephone Encounter (Signed)
Patient called and spoke with Maudie Mercury in [place] requesting return call from nurse. She states she has "a knot in her groin area and pain in her leg." Returned call to patient and left VM requesting return call.

## 2015-02-19 ENCOUNTER — Ambulatory Visit: Payer: Medicare Other | Admitting: Gynecologic Oncology

## 2015-02-22 ENCOUNTER — Encounter: Payer: Self-pay | Admitting: Gynecologic Oncology

## 2015-02-22 ENCOUNTER — Ambulatory Visit: Payer: Medicare Other | Attending: Gynecologic Oncology | Admitting: Gynecologic Oncology

## 2015-02-22 VITALS — BP 137/79 | HR 65 | Temp 98.2°F | Resp 18 | Ht 65.5 in | Wt 187.5 lb

## 2015-02-22 DIAGNOSIS — R339 Retention of urine, unspecified: Secondary | ICD-10-CM | POA: Insufficient documentation

## 2015-02-22 DIAGNOSIS — R1909 Other intra-abdominal and pelvic swelling, mass and lump: Secondary | ICD-10-CM | POA: Diagnosis not present

## 2015-02-22 DIAGNOSIS — C539 Malignant neoplasm of cervix uteri, unspecified: Secondary | ICD-10-CM | POA: Diagnosis present

## 2015-02-22 MED ORDER — OXYCODONE-ACETAMINOPHEN 5-325 MG PO TABS: 1.0000 | ORAL_TABLET | Freq: Four times a day (QID) | ORAL | Status: DC | PRN

## 2015-02-22 NOTE — Patient Instructions (Signed)
We would like to see you back in 3 months. We will call you with a date and time for the ultrasound and biopsy. Please call in the meantime with any new or worsening symptoms.

## 2015-02-22 NOTE — Progress Notes (Signed)
POSTOPERATIVE FOLLOWUP Assessment:    39 y.o. year old with Stage IB1 cervical cancer.   S/p modified radical hysterectomy with bilateral pelvic lymphadenectomy on 01/19/15. Swollen tender right inguinal node. Postop bladder dysfunction/urinary retention: decreased sense of urinary urge.  Plan: 1) Pathology reports reviewed today - stage IB1 cervical cancer. No adjuvant therapy indicated. 5% risk for recurrence. Recommend q 3 monthly surveillance visits and annual vaginal cytology to screen for new HPV related disease. 2) Urinary retention: counseled patient about regular voiding - every 3 hours, and positioning to aid in voiding. Discussed risks of permanent bladder dysfunction if she does not void regularly. 3)  Right inguinal node enlargement: US guided biopsy to evaluate for infection vs metastatic disease vs inflammation. 4) followup for cancer surveillance in 3 months.  HPI:  Melissa Alvarez is a 39 y.o. year old G2P2000 initially seen in consultation on 11/24/14 for cervical cancer.  She then underwent a cold knife conization on 08/07/61 without complications.  Her postoperative course was complicated by a trip to the ER for severe pain post cone. CT imaging showed no abscess or abnormality. Her white count was mildly elevated but she was afebrile. She was discharged from the ER and I prescribed a course of doxy and flagl for presumed cervicitis. She continues to have pain postop op and so I repeated her CT imaging on 12/23/14 and this showed slightly more pelvic fluid, but no abscess. She has multiple fibroids, one of which appears to be degenerating.   Her final pathology from her cone revealed invasive moderately differentiated SCC measuring 1.16mm in greatest dimension with 1.60mm depth of invasion. There were multiple foci all less than 23mm. LVSI was present. 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 The post cone endocervical curretting was positive for fragments of squamous cell carcinoma.  On  01/19/15 she underwent robotic assisted type II modified radical hysterectomy, with RSO and bilateral pelvic lymphadenectomy. Pathology revealed CIS but no residual carcinoma and lymph nodes were negative. She did not meet criteria for needing adjuvant therapy.  Postop she experienced severe nausea and emesis and abdominal pain in the 1st week, and required readmission for supportive care, however she resolved symptoms within 24 hours. CT imaging was unremarkable.  Interval Hx: she has developed right inguinal masses x 2 postop and right groin pain. She states that she has had the pain since preop (including pre cone biopsy) however, the palpable masses weren't present preop. She otherwise has no major complaints. She is requesting a new prescription for pain meds secondary to her groin pain. She has difficulty voiding urine because she has diminished sensation of fullness. She has to wait when she goes and position herself for voiding. No lymphedema.  She is not sexually active.  Current Outpatient Prescriptions on File Prior to Visit  Medication Sig Dispense Refill  . albuterol (PROVENTIL HFA;VENTOLIN HFA) 108 (90 BASE) MCG/ACT inhaler Inhale 2 puffs into the lungs once. (Patient taking differently: Inhale 2 puffs into the lungs every 6 (six) hours as needed for wheezing or shortness of breath. ) 1 Inhaler 1  . ondansetron (ZOFRAN) 4 MG tablet Take 1 tablet (4 mg total) by mouth every 6 (six) hours as needed for nausea. (Patient not taking: Reported on 02/01/2015) 20 tablet 0  . oxyCODONE-acetaminophen (PERCOCET/ROXICET) 5-325 MG per tablet Take 1-2 tablets by mouth every 6 (six) hours as needed for severe pain. (Patient not taking: Reported on 02/22/2015) 15 tablet 0   No current facility-administered medications on file prior  to visit.    Allergies  Allergen Reactions  . Acetaminophen Hives    With codeine   . Other Hives    Clinical steroids except for solu medrol  . Penicillins Hives, Itching  and Rash  . Doxycycline Hives, Itching and Rash  . Erythromycin Hives, Itching and Rash  . Motrin [Ibuprofen] Hives, Itching and Rash    Past Medical History  Diagnosis Date  . Asthma   . Cancer     cervical  . Anemia   . History of blood transfusion     last one 2 years ago   . COPD (chronic obstructive pulmonary disease)   . Gastric ulcer     x 5  . Difficulty sleeping   . Family history of adverse reaction to anesthesia     pts daughter awaken during surgery   . Shortness of breath dyspnea     exertion   . Numbness and tingling     right hip area following June 2016 surgery pt states has improved   . Postoperative nausea and vomiting 01/21/2015   Past Surgical History  Procedure Laterality Date  . Appendectomy    . Cholecystectomy    . Tonsillectomy    . Cervical cone biopsy      x 6  . Cesarean section      x 2  . Shoulder surgery Left   . Cervical conization w/bx N/A 12/08/2014    Procedure: COLD KNIFE CONIZATION OF CERVIX;  Surgeon: [name], MD;  Location: [facility];  Service: Gynecology;  Laterality: N/A;  . Dilation and curettage of uterus    . Robotic assisted total hysterectomy N/A 01/19/2015    Procedure: ROBOTIC ASSISTED RADICAL HYSTERECTOMY RIGHT TUBE AND OVARY, LEFT TUBE;  Surgeon: [name], MD;  Location: [facility];  Service: Gynecology;  Laterality: N/A;  . Pelvic lymph node dissection N/A 01/19/2015    Procedure: PELVIC LYMPH NODE DISSECTION;  Surgeon: [name], MD;  Location: [facility];  Service: Gynecology;  Laterality: N/A;   Family History  Problem Relation Age of Onset  . Cancer Mother   . Hypertension Mother   . Diabetes Mother   . CAD Mother   . Diabetes Other   . Hypertension Other   . Cancer Other   . CAD Other    Social History   Social History  . Marital Status: Single    Spouse Name: N/A  . Number of Children: N/A  . Years of Education: N/A   Occupational History  . Not on file.   Social History Main Topics  . Smoking status:  Current Every Day Smoker -- 2.00 packs/day for 24 years    Types: Cigarettes  . Smokeless tobacco: Never Used  . Alcohol Use: No     Comment: past hx of occas use   . Drug Use: Yes    Special: Marijuana     Comment: occasional, situational, to relieve stress pt states does not currently use   . Sexual Activity: No   Other Topics Concern  . Not on file   Social History Narrative     Review of systems: Constitutional:  She has no weight gain or weight loss. She has no fever or chills. Eyes: No blurred vision Ears, Nose, Mouth, Throat: No dizziness, headaches or changes in hearing. No mouth sores. Cardiovascular: No chest pain, palpitations or edema. Respiratory:  No shortness of breath, wheezing or cough Gastrointestinal: She has normal bowel movements without diarrhea or constipation. She denies  any nausea or vomiting. She denies blood in her stool or heart burn. Genitourinary:  + groin pain, no bleeding Musculoskeletal: Denies muscle weakness or joint pains.  Skin:  She has no skin changes, rashes or itching Neurological:  + headache x 2 days Psychiatric:  She denies depression or anxiety. Hematologic/Lymphatic:   No easy bruising or bleeding   Physical Exam: Blood pressure 137/79, pulse 65, temperature 98.2 F (36.8 C), temperature source Oral, resp. rate 18, height 5' 5.5" (1.664 m), weight 187 lb 8 oz (85.049 kg), SpO2 100 %. General: Well dressed, well nourished in no apparent distress.   HEENT:  Normocephalic and atraumatic, no lesions.  Extraocular muscles intact. Sclerae anicteric. Pupils equal, round, reactive. No mouth sores or  Abdomen:  Soft, nontender, nondistended.  No palpable masses.  No hepatosplenomegaly.  No ascites. Normal bowel sounds.  No hernias.  Well healed incisions. Genitourinary: Normal EGBUS  Vaginal cuff in tact with no abnormal drainage. Healing normally, Extremities: No cyanosis, clubbing or edema.  No calf tenderness or erythema. No palpable  cords. Psychiatric: Mood and affect are appropriate. Neurological: Awake, alert and oriented x 3. Sensation is intact, no neuropathy.  Musculoskeletal: No pain, normal strength and range of motion. Lymph nodes: right inguinal region there is a 2cm palpable tender node in the mid region, and a smaller one at the pubic tubercle on the right. No cervical adenopathy.  [name], MD

## 2015-02-23 ENCOUNTER — Other Ambulatory Visit: Payer: Self-pay | Admitting: Gynecologic Oncology

## 2015-02-23 DIAGNOSIS — R1909 Other intra-abdominal and pelvic swelling, mass and lump: Secondary | ICD-10-CM

## 2015-02-24 ENCOUNTER — Telehealth (HOSPITAL_COMMUNITY): Payer: Self-pay | Admitting: Radiology

## 2015-02-24 NOTE — Telephone Encounter (Signed)
I s/w [name] from the cc today.  She requested the bx be scheduled at the same time as the u/s which is for tomorrow.  She stated the NP had spoken to Dr. Annamaria Boots about this.  I called [name].  Her and I looked at the notes in the chart.  Per the notes, Dr. Annamaria Boots stated to get the u/s done first.  [name] paged him twice to re-verify with no response.  I called [name] back and told her we will keep the pt on for the u/s only since we could not reach Dr. Annamaria Boots and there is no additional time tomorrow to add-in the bx.  She stated she would advise the NP.

## 2015-02-25 ENCOUNTER — Other Ambulatory Visit: Payer: Self-pay | Admitting: Gynecologic Oncology

## 2015-02-25 ENCOUNTER — Ambulatory Visit (HOSPITAL_COMMUNITY)
Admission: RE | Admit: 2015-02-25 | Discharge: 2015-02-25 | Disposition: A | Discharge status: Home / Self Care | Payer: Medicare Other | Source: Ambulatory Visit | Attending: Gynecologic Oncology | Admitting: Gynecologic Oncology

## 2015-02-25 DIAGNOSIS — R590 Localized enlarged lymph nodes: Secondary | ICD-10-CM | POA: Diagnosis not present

## 2015-02-25 DIAGNOSIS — R1909 Other intra-abdominal and pelvic swelling, mass and lump: Secondary | ICD-10-CM

## 2015-03-05 ENCOUNTER — Other Ambulatory Visit: Payer: Self-pay | Admitting: Radiology

## 2015-03-09 ENCOUNTER — Ambulatory Visit (HOSPITAL_COMMUNITY)
Admission: RE | Admit: 2015-03-09 | Discharge: 2015-03-09 | Disposition: A | Discharge status: Home / Self Care | Payer: Medicare Other | Source: Ambulatory Visit | Attending: Gynecologic Oncology | Admitting: Gynecologic Oncology

## 2015-03-09 ENCOUNTER — Encounter (HOSPITAL_COMMUNITY): Payer: Self-pay

## 2015-03-09 DIAGNOSIS — Z90710 Acquired absence of both cervix and uterus: Secondary | ICD-10-CM | POA: Insufficient documentation

## 2015-03-09 DIAGNOSIS — C539 Malignant neoplasm of cervix uteri, unspecified: Secondary | ICD-10-CM

## 2015-03-09 DIAGNOSIS — R591 Generalized enlarged lymph nodes: Secondary | ICD-10-CM | POA: Diagnosis not present

## 2015-03-09 DIAGNOSIS — R590 Localized enlarged lymph nodes: Secondary | ICD-10-CM | POA: Diagnosis not present

## 2015-03-09 LAB — CBC
HCT: 32.7 % — ABNORMAL LOW (ref 36.0–46.0)
Hemoglobin: 9.8 g/dL — ABNORMAL LOW (ref 12.0–15.0)
MCH: 20.5 pg — ABNORMAL LOW (ref 26.0–34.0)
MCHC: 30 g/dL (ref 30.0–36.0)
MCV: 68.6 fL — AB (ref 78.0–100.0)
PLATELETS: 236 10*3/uL (ref 150–400)
RBC: 4.77 MIL/uL (ref 3.87–5.11)
RDW: 19.1 % — ABNORMAL HIGH (ref 11.5–15.5)
WBC: 8.4 10*3/uL (ref 4.0–10.5)

## 2015-03-09 LAB — PROTIME-INR
INR: 1.01 (ref 0.00–1.49)
PROTHROMBIN TIME: 13.5 s (ref 11.6–15.2)

## 2015-03-09 LAB — APTT: APTT: 28 s (ref 24–37)

## 2015-03-09 MED ORDER — MIDAZOLAM HCL 2 MG/2ML IJ SOLN
INTRAMUSCULAR | Status: AC | PRN
  Administered 2015-03-09 (×3): 1 mg via INTRAVENOUS

## 2015-03-09 MED ORDER — SODIUM CHLORIDE 0.9 % IV SOLN
Freq: Once | INTRAVENOUS | Status: AC
  Administered 2015-03-09: 12:00:00 via INTRAVENOUS

## 2015-03-09 MED ORDER — FENTANYL CITRATE (PF) 100 MCG/2ML IJ SOLN
INTRAMUSCULAR | Status: AC | PRN
  Administered 2015-03-09 (×2): 50 ug via INTRAVENOUS

## 2015-03-09 MED ORDER — FENTANYL CITRATE (PF) 100 MCG/2ML IJ SOLN
INTRAMUSCULAR | Status: AC
  Filled 2015-03-09: qty 4

## 2015-03-09 MED ORDER — MIDAZOLAM HCL 2 MG/2ML IJ SOLN
INTRAMUSCULAR | Status: AC
  Filled 2015-03-09: qty 6

## 2015-03-09 NOTE — Discharge Instructions (Signed)
Needle Biopsy °Care After °These instructions give you information on caring for yourself after your procedure. Your doctor may also give you more specific instructions. Call your doctor if you have any problems or questions after your procedure. °HOME CARE °· Rest for 4 hours after your biopsy, except for getting up to go to the bathroom or as told. °· Keep the places where the needles were put in clean and dry. °· Do not put powder or lotion on the sites. °· Do not shower until 24 hours after the test. Remove all bandages (dressings) before showering. °· Remove all bandages at least once every day. Gently clean the sites with soap and water. Keep putting a new bandage on until the skin is closed. °Finding out the results of your test °Ask your doctor when your test results will be ready. Make sure you follow up and get the test results. °GET HELP RIGHT AWAY IF:  °· You have shortness of breath or trouble breathing. °· You have pain or cramping in your belly (abdomen). °· You feel sick to your stomach (nauseous) or throw up (vomit). °· Any of the places where the needles were put in: °¨ Are puffy (swollen) or red. °¨ Are sore or hot to the touch. °¨ Are draining yellowish-white fluid (pus). °¨ Are bleeding after 10 minutes of pressing down on the site. Have someone keep pressing on any place that is bleeding until you see a doctor. °· You have any unusual pain that will not stop. °· You have a fever. °If you go to the emergency room, tell the nurse that you had a biopsy. Take this paper with you to show the nurse. °MAKE SURE YOU:  °· Understand these instructions. °· Will watch your condition. °· Will get help right away if you are not doing well or get worse. °Document Released: 06/01/2008 Document Revised: 09/11/2011 Document Reviewed: 06/01/2008 °ExitCare® Patient Information ©2015 [employer]. This information is not intended to replace advice given to you by your health care provider. Make sure you discuss  any questions you have with your health care provider. °Conscious Sedation °Sedation is the use of medicines to promote relaxation and relieve discomfort and anxiety. Conscious sedation is a type of sedation. Under conscious sedation you are less alert than normal but are still able to respond to instructions or stimulation. Conscious sedation is used during short medical and dental procedures. It is milder than deep sedation or general anesthesia and allows you to return to your regular activities sooner.  °LET YOUR HEALTH CARE PROVIDER KNOW ABOUT:  °· Any allergies you have. °· All medicines you are taking, including vitamins, herbs, eye drops, creams, and over-the-counter medicines. °· Use of steroids (by mouth or creams). °· Previous problems you or members of your family have had with the use of anesthetics. °· Any blood disorders you have. °· Previous surgeries you have had. °· Medical conditions you have. °· Possibility of pregnancy, if this applies. °· Use of cigarettes, alcohol, or illegal drugs. °RISKS AND COMPLICATIONS °Generally, this is a safe procedure. However, as with any procedure, problems can occur. Possible problems include: °· Oversedation. °· Trouble breathing on your own. You may need to have a breathing tube until you are awake and breathing on your own. °· Allergic reaction to any of the medicines used for the procedure. °BEFORE THE PROCEDURE °· You may have blood tests done. These tests can help show how well your kidneys and liver are working. They can also   show how well your blood clots. °· A physical exam will be done.   °· Only take medicines as directed by your health care provider. You may need to stop taking medicines (such as blood thinners, aspirin, or nonsteroidal anti-inflammatory drugs) before the procedure.   °· Do not eat or drink at least 6 hours before the procedure or as directed by your health care provider. °· Arrange for a responsible adult, family member, or friend to  take you home after the procedure. He or she should stay with you for at least 24 hours after the procedure, until the medicine has worn off. °PROCEDURE  °· An intravenous (IV) catheter will be inserted into one of your veins. Medicine will be able to flow directly into your body through this catheter. You may be given medicine through this tube to help prevent pain and help you relax. °· The medical or dental procedure will be done. °AFTER THE PROCEDURE °· You will stay in a recovery area until the medicine has worn off. Your blood pressure and pulse will be checked.   °·  Depending on the procedure you had, you may be allowed to go home when you can tolerate liquids and your pain is under control. °Document Released: 03/14/2001 Document Revised: 06/24/2013 Document Reviewed: 02/24/2013 °ExitCare® Patient Information ©2015 [employer]. This information is not intended to replace advice given to you by your health care provider. Make sure you discuss any questions you have with your health care provider. ° °Conscious Sedation, Adult, Care After °Refer to this sheet in the next few weeks. These instructions provide you with information on caring for yourself after your procedure. Your health care provider may also give you more specific instructions. Your treatment has been planned according to current medical practices, but problems sometimes occur. Call your health care provider if you have any problems or questions after your procedure. °WHAT TO EXPECT AFTER THE PROCEDURE  °After your procedure: °· You may feel sleepy, clumsy, and have poor balance for several hours. °· Vomiting may occur if you eat too soon after the procedure. °HOME CARE INSTRUCTIONS °· Do not participate in any activities where you could become injured for at least 24 hours. Do not: °¨ Drive. °¨ Swim. °¨ Ride a bicycle. °¨ Operate heavy machinery. °¨ Cook. °¨ Use power tools. °¨ Climb ladders. °¨ Work from a high place. °· Do not make  important decisions or sign legal documents until you are improved. °· If you vomit, drink water, juice, or soup when you can drink without vomiting. Make sure you have little or no nausea before eating solid foods. °· Only take over-the-counter or prescription medicines for pain, discomfort, or fever as directed by your health care provider. °· Make sure you and your family fully understand everything about the medicines given to you, including what side effects may occur. °· You should not drink alcohol, take sleeping pills, or take medicines that cause drowsiness for at least 24 hours. °· If you smoke, do not smoke without supervision. °· If you are feeling better, you may resume normal activities 24 hours after you were sedated. °· Keep all appointments with your health care provider. °SEEK MEDICAL CARE IF: °· Your skin is pale or bluish in color. °· You continue to feel nauseous or vomit. °· Your pain is getting worse and is not helped by medicine. °· You have bleeding or swelling. °· You are still sleepy or feeling clumsy after 24 hours. °SEEK IMMEDIATE MEDICAL CARE IF: °· You develop   a rash. °· You have difficulty breathing. °· You develop any type of allergic problem. °· You have a fever. °MAKE SURE YOU: °· Understand these instructions. °· Will watch your condition. °· Will get help right away if you are not doing well or get worse. °Document Released: 04/09/2013 Document Reviewed: 04/09/2013 °ExitCare® Patient Information ©2015 [employer]. This information is not intended to replace advice given to you by your health care provider. Make sure you discuss any questions you have with your health care provider. ° ° °

## 2015-03-09 NOTE — H&P (Signed)
HPI: Patient with stage IB1 cervical cancer s/p modified radical hysterectomy with bilateral pelvic lymphadenectomy on 01/19/15 now with post operative painful right inguinal lymphadenopathy. She has been seen by Dr. Denman George on 02/22/15 and scheduled today for image guided right inguinal lymph node biopsy  The patient has had a H&P performed within the last 30 days, all history, medications, and exam have been reviewed. The patient denies any interval changes since the H&P.  Medications: Prior to Admission medications   Medication Sig Start Date End Date Taking? Authorizing Provider  albuterol (PROVENTIL HFA;VENTOLIN HFA) 108 (90 BASE) MCG/ACT inhaler Inhale 2 puffs into the lungs once. Patient taking differently: Inhale 2 puffs into the lungs every 6 (six) hours as needed for wheezing or shortness of breath.  06/25/14  Yes Linton Flemings, MD  ondansetron (ZOFRAN) 4 MG tablet Take 1 tablet (4 mg total) by mouth every 6 (six) hours as needed for nausea. Patient not taking: Reported on 02/01/2015 01/22/15   [name], MD  oxyCODONE-acetaminophen (PERCOCET/ROXICET) 5-325 MG per tablet Take 1-2 tablets by mouth every 6 (six) hours as needed for severe pain. 02/22/15   [name], MD    Vital Signs: BP 134/81 mmHg  Pulse 70  Temp(Src) 98.7 F (37.1 C) (Oral)  Resp 16  SpO2 100%  LMP   Physical Exam  Constitutional: No distress.  HENT:  Head: Normocephalic and atraumatic.  Cardiovascular: Normal rate and regular rhythm.  Exam reveals no gallop and no friction rub.   No murmur heard. Pulmonary/Chest: Effort normal. No respiratory distress. She has wheezes. She has no rales.  Neurological: She is alert.  Skin: She is not diaphoretic.    Mallampati Score:  MD Evaluation Airway: WNL Heart: WNL Abdomen: WNL Chest/ Lungs: WNL ASA  Classification: 2 Mallampati/Airway Score: Two  Labs:  CBC:  Recent Labs  01/21/15 1627 01/22/15 0930 02/01/15 2039 03/09/15 1140  WBC 13.0* 10.9* 10.3  8.4  HGB 9.2* 8.9* 8.8* 9.8*  HCT 30.3* 29.3* 30.5* 32.7*  PLT 198 182 436* 236    COAGS:  Recent Labs  03/09/15 1140  INR 1.01  APTT 28    BMP:  Recent Labs  01/20/15 0436 01/21/15 1627 01/22/15 0930 02/01/15 2039  NA 137 135 134* 139  K 3.5 3.3* 3.4* 3.8  CL 106 105 105 107  CO2 24 23 23 26   GLUCOSE 106* 119* 105* 97  BUN <5* <5* <5* 8  CALCIUM 8.4* 8.6* 8.5* 8.9  CREATININE 0.59 0.62 0.52 0.72  GFRNONAA >60 >60 >60 >60  GFRAA >60 >60 >60 >60    LIVER FUNCTION TESTS:  Recent Labs  07/18/14 1938 12/16/14 1903 01/14/15 0830 01/21/15 1627  BILITOT 0.4 0.4 0.2* 0.7  AST 25 49* 23 93*  ALT 19 67* 19 166*  ALKPHOS 57 74 57 109  PROT 6.9 8.1 7.1 7.1  ALBUMIN 3.9 4.2 3.8 3.4*    Assessment/Plan:  Stage IB1 cervical cancer s/p modified radical hysterectomy with bilateral pelvic lymphadenectomy on 01/19/15 Post operative painful right inguinal lymphadenopathy Seen by Dr. Denman George 02/22/15 Scheduled today for image guided right inguinal lymph node biopsy with sedation The patient has been NPO, no blood thinners taken, labs and vitals have been reviewed.\ Risks and Benefits discussed with the patient including, but not limited to bleeding, infection, damage to adjacent structures or low yield requiring additional tests. All of the patient's questions were answered, patient is agreeable to proceed. Consent signed and in chart.   Signed: Hedy Jacob  03/09/2015, 12:40 PM

## 2015-03-09 NOTE — Procedures (Signed)
Interventional Radiology Procedure Note  Procedure:  US guided right inguinal lymph node biopsy  Complications:  None  Estimated Blood Loss: <10 mL  Largest right inguinal LN sampled with 18 G core biopsy x 4.  No complications.  Venetia Night. Kathlene Cote, M.D Pager:  [phone]

## 2015-03-11 ENCOUNTER — Telehealth: Payer: Self-pay | Admitting: *Deleted

## 2015-03-11 NOTE — Telephone Encounter (Signed)
Path results given to pt: no malignancy identified. Pt complained of pain to top of vagina, pt removed bandagage denies drainage/discharge or bleeding, denied fever, n/v. Pt denied taking any pain medication. Encouraged pt to take pain medication, keep area clean and dry, sitz bath to help with discomfort. No further concerns.

## 2015-03-11 NOTE — Telephone Encounter (Addendum)
Pt called with request for pain meds, states " [place] out of pain medication before my surgery and then didn't get anymore."Pt took temp 100.00" Pt denied needing to be seen by NP or MD to assess vaginal area. Encouraged pt to put ice compress on vaginal area, drink plenty of fluids.  Reviewed with NP No additional refill on percocet at this time. Pt to see PCP for any additional narcotics. Called pt to discuss above information Pt advised she cant take anything over the counter for pain due to allergies.  Encouraged cool compress and elevate pelvis. Pt states " ok " and phone disconnected.

## 2015-03-19 ENCOUNTER — Emergency Department (HOSPITAL_COMMUNITY)
Admission: EM | Admit: 2015-03-19 | Discharge: 2015-03-20 | Discharge status: Against Medical Advice | Payer: Medicare Other | Attending: Emergency Medicine | Admitting: Emergency Medicine

## 2015-03-19 ENCOUNTER — Encounter (HOSPITAL_COMMUNITY): Payer: Self-pay | Admitting: Emergency Medicine

## 2015-03-19 DIAGNOSIS — J449 Chronic obstructive pulmonary disease, unspecified: Secondary | ICD-10-CM | POA: Insufficient documentation

## 2015-03-19 DIAGNOSIS — R1031 Right lower quadrant pain: Secondary | ICD-10-CM | POA: Insufficient documentation

## 2015-03-19 DIAGNOSIS — Z720 Tobacco use: Secondary | ICD-10-CM | POA: Diagnosis not present

## 2015-03-19 NOTE — ED Notes (Signed)
Pt states she had a biopsy done on her right groin area last week  Pt states after the biopsy she started to have an increase in swelling to the area  Pt states now the groin, right thigh, and into her vaginal area are swollen  Pt states she has an increase in pain as well

## 2015-03-20 ENCOUNTER — Emergency Department (HOSPITAL_COMMUNITY)
Admission: EM | Admit: 2015-03-20 | Discharge: 2015-03-20 | Disposition: A | Discharge status: Home / Self Care | Payer: Medicare Other | Attending: Emergency Medicine | Admitting: Emergency Medicine

## 2015-03-20 ENCOUNTER — Encounter (HOSPITAL_COMMUNITY): Payer: Self-pay | Admitting: Emergency Medicine

## 2015-03-20 DIAGNOSIS — Z862 Personal history of diseases of the blood and blood-forming organs and certain disorders involving the immune mechanism: Secondary | ICD-10-CM | POA: Diagnosis not present

## 2015-03-20 DIAGNOSIS — Z720 Tobacco use: Secondary | ICD-10-CM | POA: Insufficient documentation

## 2015-03-20 DIAGNOSIS — R1030 Lower abdominal pain, unspecified: Secondary | ICD-10-CM | POA: Diagnosis not present

## 2015-03-20 DIAGNOSIS — Z8719 Personal history of other diseases of the digestive system: Secondary | ICD-10-CM | POA: Diagnosis not present

## 2015-03-20 DIAGNOSIS — Z9049 Acquired absence of other specified parts of digestive tract: Secondary | ICD-10-CM | POA: Diagnosis not present

## 2015-03-20 DIAGNOSIS — J449 Chronic obstructive pulmonary disease, unspecified: Secondary | ICD-10-CM | POA: Diagnosis not present

## 2015-03-20 DIAGNOSIS — Z8541 Personal history of malignant neoplasm of cervix uteri: Secondary | ICD-10-CM | POA: Insufficient documentation

## 2015-03-20 DIAGNOSIS — Z79899 Other long term (current) drug therapy: Secondary | ICD-10-CM | POA: Insufficient documentation

## 2015-03-20 DIAGNOSIS — Z8669 Personal history of other diseases of the nervous system and sense organs: Secondary | ICD-10-CM | POA: Diagnosis not present

## 2015-03-20 DIAGNOSIS — Z90710 Acquired absence of both cervix and uterus: Secondary | ICD-10-CM | POA: Diagnosis not present

## 2015-03-20 DIAGNOSIS — J45909 Unspecified asthma, uncomplicated: Secondary | ICD-10-CM | POA: Diagnosis not present

## 2015-03-20 DIAGNOSIS — R1031 Right lower quadrant pain: Secondary | ICD-10-CM | POA: Diagnosis not present

## 2015-03-20 DIAGNOSIS — Z880 Allergy status to penicillin: Secondary | ICD-10-CM | POA: Insufficient documentation

## 2015-03-20 MED ORDER — HYDROCODONE-ACETAMINOPHEN 5-325 MG PO TABS: 1.0000 | ORAL_TABLET | Freq: Four times a day (QID) | ORAL | Status: DC | PRN

## 2015-03-20 MED ORDER — OXYCODONE HCL 5 MG PO TABS: 5.0000 mg | ORAL_TABLET | ORAL | Status: DC | PRN

## 2015-03-20 NOTE — ED Provider Notes (Signed)
CSN: [id number]     Arrival date & time 03/20/15  1445 History  This chart was scribed for non-physician practitioner, Hyman Bible, PA-C,  working with Orpah Greek, MD by Evelene Croon, ED Scribe. This patient was seen in room WTR5/WTR5 and the patient's care was started at 4:30 PM.    Chief Complaint  Patient presents with  . Groin Pain   The history is provided by the patient. No language interpreter was used.     HPI Comments:  Melissa Alvarez is a 39 y.o. female with a PMHx of Cervical CA s/p radical hysterectomy with bilateral pelvic lymph adenectomy on 01/19/15 and lymph node biopsy on 03/09/15 presents today with right groin pain. She states 7/10 pain and swelling to her right groin since the biopsy. Pt attempted to call the physician who performed the biopsy but her physician is on vacation.  Pt's pain is worse with movement of the right leg and palpation.  She was taking percocet for her pain but ran out. She denies fever, chills, nausea, vomiting,  abdominal pain, redness/warmth to groin area and urinary symptoms. She has a f/u appointment scheduled with surgeon in Oct 2016.  Past Medical History  Diagnosis Date  . Asthma   . Cancer     cervical  . Anemia   . History of blood transfusion     last one 2 years ago   . COPD (chronic obstructive pulmonary disease)   . Gastric ulcer     x 5  . Difficulty sleeping   . Family history of adverse reaction to anesthesia     pts daughter awaken during surgery   . Shortness of breath dyspnea     exertion   . Numbness and tingling     right hip area following June 2016 surgery pt states has improved   . Postoperative nausea and vomiting 01/21/2015   Past Surgical History  Procedure Laterality Date  . Appendectomy    . Cholecystectomy    . Tonsillectomy    . Cervical cone biopsy      x 6  . Cesarean section      x 2  . Shoulder surgery Left   . Cervical conization w/bx N/A 12/08/2014    Procedure: COLD KNIFE  CONIZATION OF CERVIX;  Surgeon: [name], MD;  Location: [facility];  Service: Gynecology;  Laterality: N/A;  . Dilation and curettage of uterus    . Robotic assisted total hysterectomy N/A 01/19/2015    Procedure: ROBOTIC ASSISTED RADICAL HYSTERECTOMY RIGHT TUBE AND OVARY, LEFT TUBE;  Surgeon: [name], MD;  Location: [facility];  Service: Gynecology;  Laterality: N/A;  . Pelvic lymph node dissection N/A 01/19/2015    Procedure: PELVIC LYMPH NODE DISSECTION;  Surgeon: [name], MD;  Location: [facility];  Service: Gynecology;  Laterality: N/A;   Family History  Problem Relation Age of Onset  . Cancer Mother   . Hypertension Mother   . Diabetes Mother   . CAD Mother   . Diabetes Other   . Hypertension Other   . Cancer Other   . CAD Other    Social History  Substance Use Topics  . Smoking status: Current Every Day Smoker -- 2.00 packs/day for 24 years    Types: Cigarettes  . Smokeless tobacco: Never Used  . Alcohol Use: No     Comment: past hx of occas use    OB History    Gravida Para Term Preterm AB TAB SAB Ectopic  Multiple Living   2 2 2             Review of Systems  Constitutional: Negative for fever and chills.  Gastrointestinal: Negative for nausea, vomiting and abdominal pain.  Genitourinary: Positive for pelvic pain (Groin pain ). Negative for dysuria and hematuria.    Allergies  Acetaminophen; Other; Penicillins; Doxycycline; Erythromycin; and Motrin  Home Medications   Prior to Admission medications   Medication Sig Start Date End Date Taking? Authorizing Provider  albuterol (PROVENTIL HFA;VENTOLIN HFA) 108 (90 BASE) MCG/ACT inhaler Inhale 2 puffs into the lungs once. Patient taking differently: Inhale 2 puffs into the lungs every 6 (six) hours as needed for wheezing or shortness of breath.  06/25/14   Linton Flemings, MD  ondansetron (ZOFRAN) 4 MG tablet Take 1 tablet (4 mg total) by mouth every 6 (six) hours as needed for nausea. Patient not taking: Reported on 02/01/2015  01/22/15   [name], MD  oxyCODONE-acetaminophen (PERCOCET/ROXICET) 5-325 MG per tablet Take 1-2 tablets by mouth every 6 (six) hours as needed for severe pain. 02/22/15   [name], MD   BP 119/74 mmHg  Pulse 92  Temp(Src) 98.8 F (37.1 C) (Oral)  Resp 16  SpO2 100%  LMP 12/08/2014 (Exact Date) Physical Exam  Constitutional: She is oriented to person, place, and time. She appears well-developed and well-nourished.  HENT:  Head: Normocephalic and atraumatic.  Neck: Normal range of motion.  Cardiovascular: Normal rate and regular rhythm.   Pulmonary/Chest: Effort normal and breath sounds normal.  Abdominal: Soft. Bowel sounds are normal. She exhibits no distension. There is no tenderness.  Abdominal incision well healed   Musculoskeletal: Normal range of motion.  Tenderness to palpation of the right groin  Neurological: She is alert and oriented to person, place, and time.  Skin: Skin is warm and dry.  No erythema, edema, or warmth of the right groin   Psychiatric: She has a normal mood and affect.  Nursing note and vitals reviewed.   ED Course  Procedures   DIAGNOSTIC STUDIES:  Oxygen Saturation is 100% on RA, normal by my interpretation.    COORDINATION OF CARE:  4:36 PM Discussed treatment plan with pt at bedside and pt agreed to plan.  Labs Review Labs Reviewed - No data to display  Imaging Review No results found. I have personally reviewed and evaluated these images and lab results as part of my medical decision-making.   EKG Interpretation None      MDM   Final diagnoses:  None   Patient with a recent inguinal lymph node biopsy on 03/09/15 presents today with right groin pain that has been present since the biopsy.  No signs of infection on exam.  She denies urinary complaints.  She is afebrile.  Feel that she is stable for discharge.  Instructed patient to follow up with OB/GYN that performed her surgery.  Patient discussed with Dr. Betsey Holiday who is in  agreement with the plan.    I personally performed the services described in this documentation, which was scribed in my presence. The recorded information has been reviewed and is accurate.    Hyman Bible, PA-C 03/20/15 [place], [place] 03/21/15 [phone]

## 2015-03-20 NOTE — ED Notes (Signed)
Per pt, states right groin pain since having a hysterectomy 2 weeks ago

## 2015-03-20 NOTE — ED Notes (Signed)
Called for second time without response from lobby 

## 2015-03-20 NOTE — ED Notes (Signed)
Called without response from lobby  

## 2015-03-20 NOTE — ED Notes (Signed)
Called pt from triage to update V/S, no response.

## 2015-04-20 DIAGNOSIS — Z8541 Personal history of malignant neoplasm of cervix uteri: Secondary | ICD-10-CM | POA: Diagnosis not present

## 2015-04-20 DIAGNOSIS — R109 Unspecified abdominal pain: Secondary | ICD-10-CM | POA: Diagnosis not present

## 2015-04-20 DIAGNOSIS — Z9049 Acquired absence of other specified parts of digestive tract: Secondary | ICD-10-CM | POA: Diagnosis not present

## 2015-04-20 DIAGNOSIS — Z881 Allergy status to other antibiotic agents status: Secondary | ICD-10-CM | POA: Diagnosis not present

## 2015-04-20 DIAGNOSIS — Z886 Allergy status to analgesic agent status: Secondary | ICD-10-CM | POA: Diagnosis not present

## 2015-04-20 DIAGNOSIS — Z880 Allergy status to penicillin: Secondary | ICD-10-CM | POA: Diagnosis not present

## 2015-04-20 DIAGNOSIS — F17200 Nicotine dependence, unspecified, uncomplicated: Secondary | ICD-10-CM | POA: Diagnosis not present

## 2015-04-20 DIAGNOSIS — N83202 Unspecified ovarian cyst, left side: Secondary | ICD-10-CM | POA: Diagnosis not present

## 2015-04-20 DIAGNOSIS — Z90710 Acquired absence of both cervix and uterus: Secondary | ICD-10-CM | POA: Diagnosis not present

## 2015-04-20 DIAGNOSIS — J45909 Unspecified asthma, uncomplicated: Secondary | ICD-10-CM | POA: Diagnosis not present

## 2015-04-20 DIAGNOSIS — R1031 Right lower quadrant pain: Secondary | ICD-10-CM | POA: Diagnosis not present

## 2015-05-06 ENCOUNTER — Encounter: Payer: Self-pay | Admitting: *Deleted

## 2015-05-26 ENCOUNTER — Ambulatory Visit: Payer: Medicare Other | Attending: Gynecologic Oncology | Admitting: Gynecologic Oncology

## 2015-05-29 DIAGNOSIS — Z881 Allergy status to other antibiotic agents status: Secondary | ICD-10-CM | POA: Diagnosis not present

## 2015-05-29 DIAGNOSIS — Z886 Allergy status to analgesic agent status: Secondary | ICD-10-CM | POA: Diagnosis not present

## 2015-05-29 DIAGNOSIS — T486X6A Underdosing of antiasthmatics, initial encounter: Secondary | ICD-10-CM | POA: Diagnosis not present

## 2015-05-29 DIAGNOSIS — Z880 Allergy status to penicillin: Secondary | ICD-10-CM | POA: Diagnosis not present

## 2015-05-29 DIAGNOSIS — Z91128 Patient's intentional underdosing of medication regimen for other reason: Secondary | ICD-10-CM | POA: Diagnosis not present

## 2015-05-29 DIAGNOSIS — R0602 Shortness of breath: Secondary | ICD-10-CM | POA: Diagnosis not present

## 2015-05-29 DIAGNOSIS — J45901 Unspecified asthma with (acute) exacerbation: Secondary | ICD-10-CM | POA: Diagnosis not present

## 2015-06-02 IMAGING — US US PELVIS COMPLETE
1 series · 13 of 25 positions shown · non-contrast
Comparison: CT 06/25/2014.  Pelvic ultrasound 04/30/2014

CLINICAL DATA: Left adnexal pain and tenderness. Evaluate for
torsion.

EXAM:
TRANSABDOMINAL AND TRANSVAGINAL ULTRASOUND OF PELVIS
DOPPLER ULTRASOUND OF OVARIES
TECHNIQUE: Both transabdominal and transvaginal ultrasound examinations of the
pelvis were performed. Transabdominal technique was performed for
global imaging of the pelvis including uterus, ovaries, adnexal
regions, and pelvic cul-de-sac.
It was necessary to proceed with endovaginal exam following the
transabdominal exam to visualize the uterus, endometrium, and
ovaries. Color and duplex Doppler ultrasound was utilized to
evaluate blood flow to the ovaries.

[Series 1: us pelvis complete · 0.22mm/px · 13 of 52 slices shown]
[im 1/52]
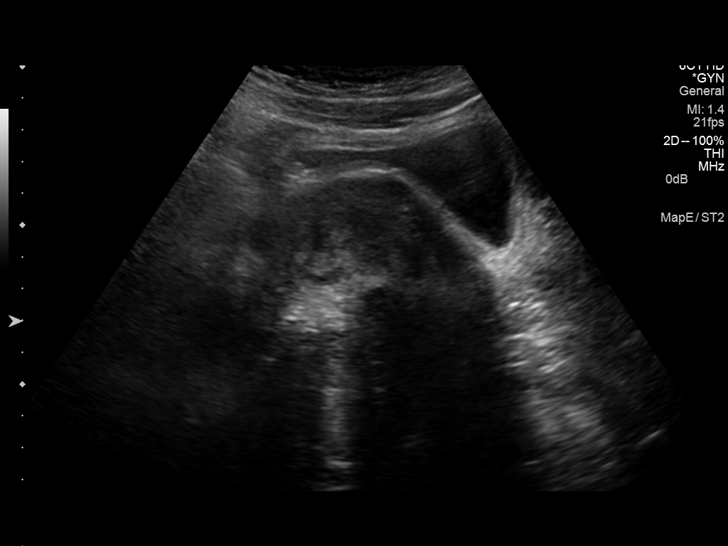
[im 5/52]
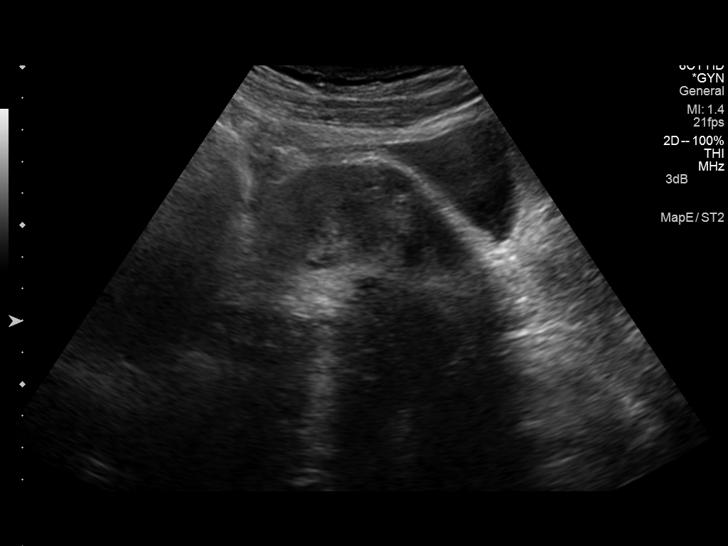
[im 9/52]
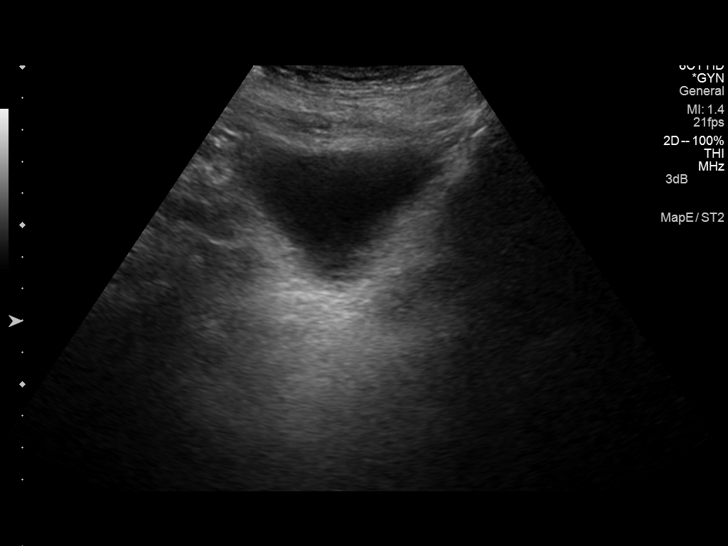
[im 13/52]
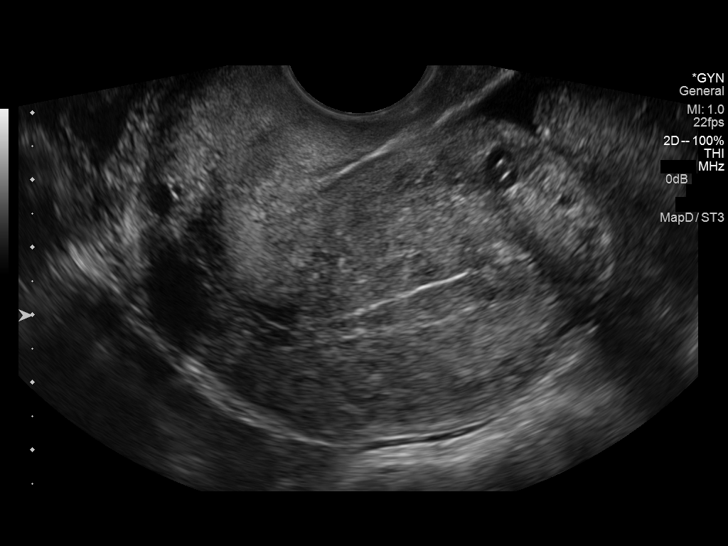
[im 18/52]
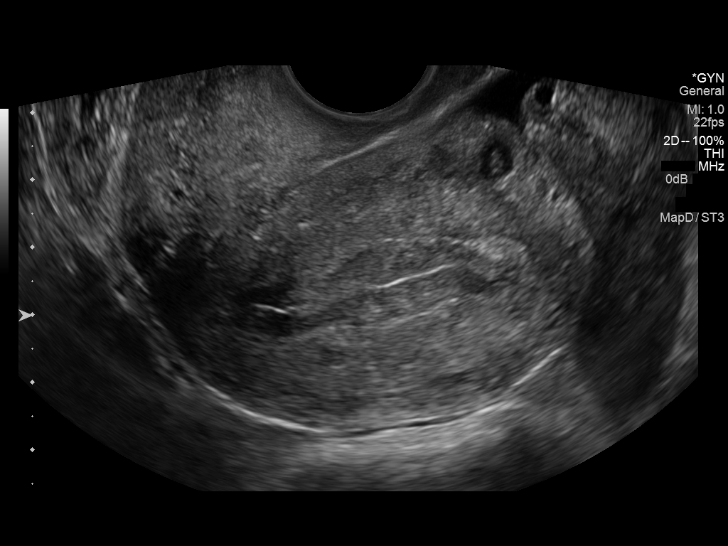
[im 22/52]
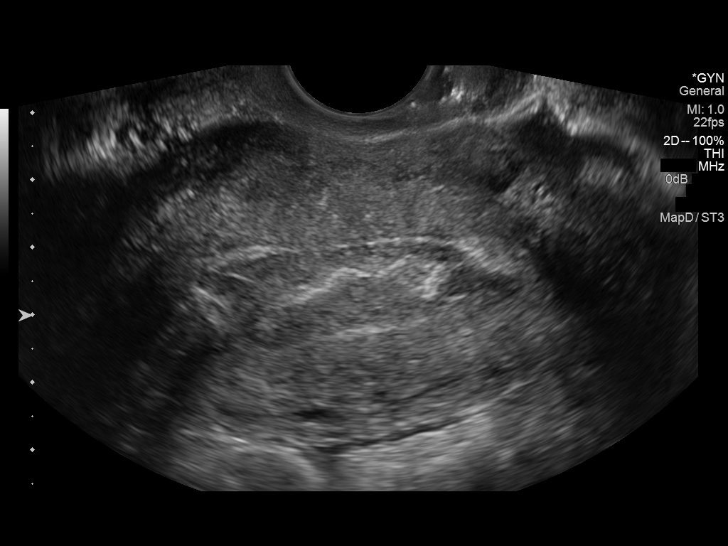
[im 26/52]
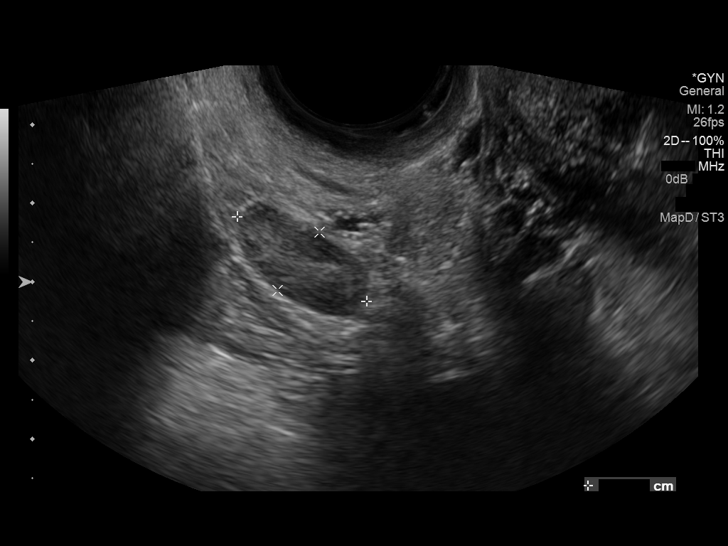
[im 30/52]
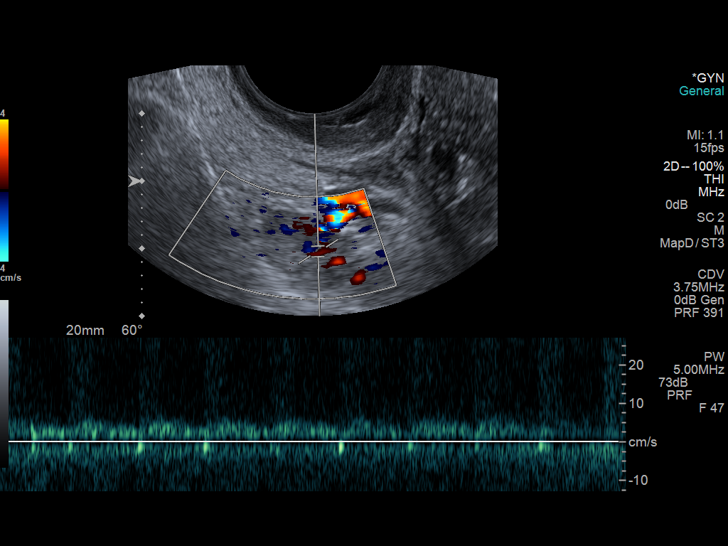
[im 35/52]
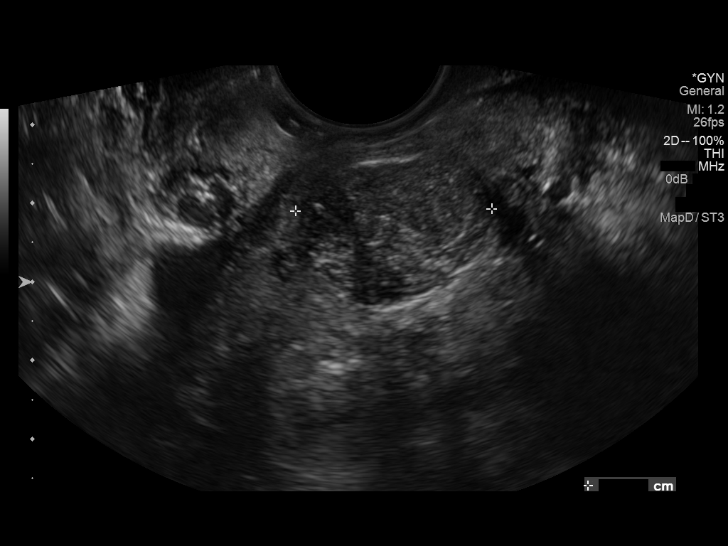
[im 39/52]
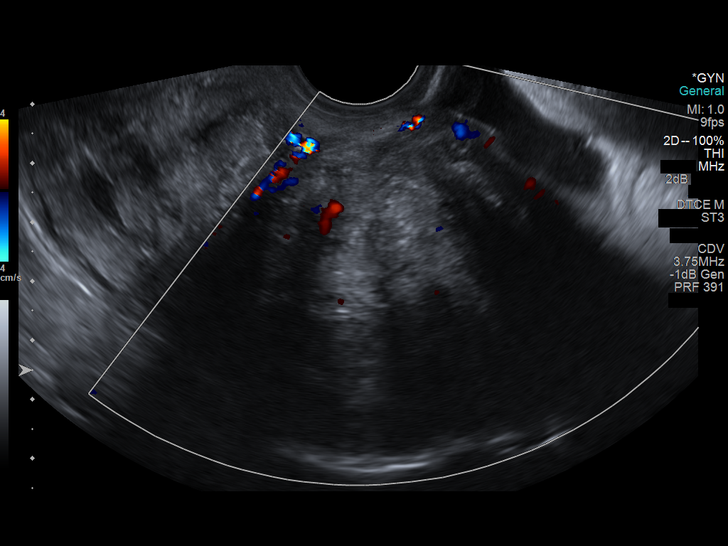
[im 43/52]
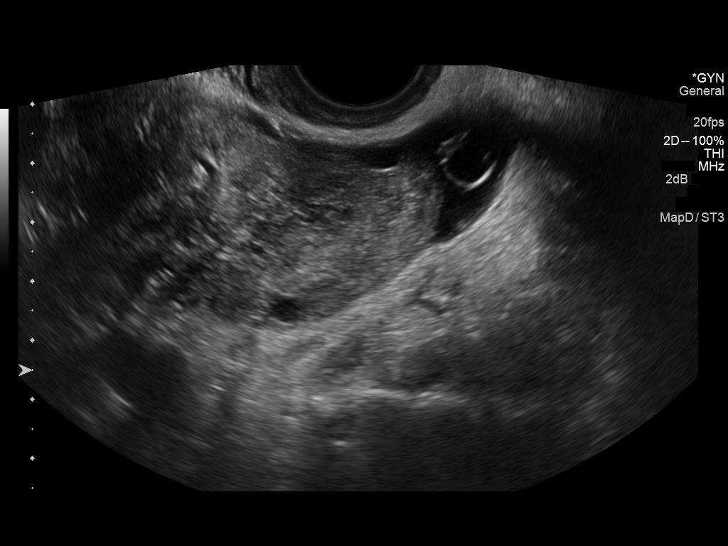
[im 47/52]
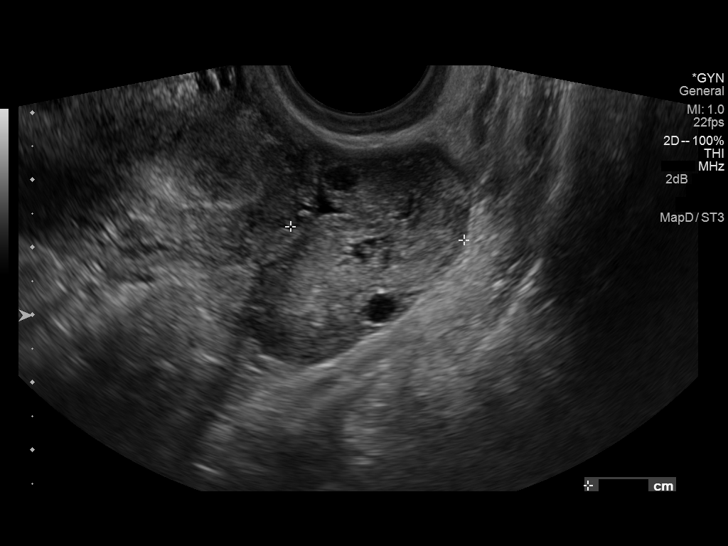
[im 52/52]
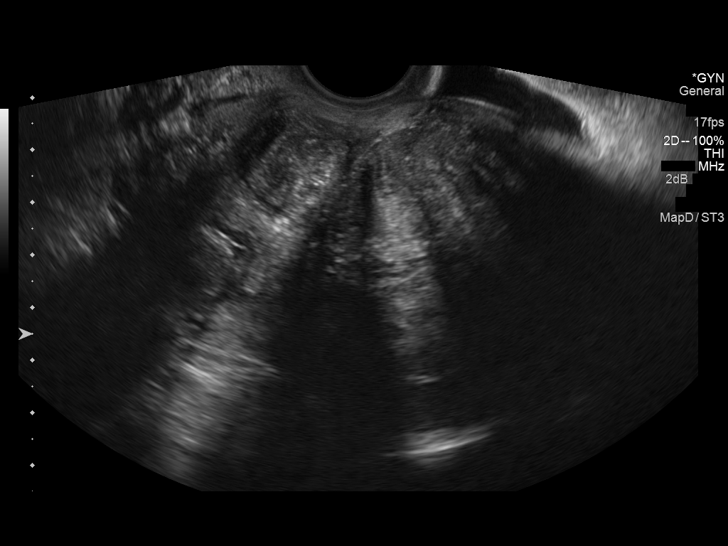

[13 of 25 positions shown; findings below may reference images not displayed]

FINDINGS: Uterus

Measurements: Retroverted and measuring 7.4 x 4.5 x 6.2 cm,
measurements likely underestimating the uterine size. This is not
included exophytic uterine fibroids and measures 5.0 x 4.9 x 6.1 cm
about the fundus. Additional smaller uterine fibroids are seen.

Endometrium

Thickness: 11 mm. The endometrium remains heterogeneous. No focal
abnormality visualized.

Right ovary

Measurements: 2.0 x 1.0 x 1.0 cm. Normal appearance/no adnexal mass.

Left ovary

Measurements: 4.8 x 2.3 x 2.6 cm. Normal appearance/no adnexal mass.

Pulsed Doppler evaluation of both ovaries demonstrates normal
low-resistance arterial and venous waveforms.

Other findings

Small volume of free fluid.
IMPRESSION: 1. Normal appearance of both ovaries without torsion. Normal blood
flow.
2. Uterine fibroids, largest at the posterior fundus. Heterogeneous
endometrium appear similar to prior exams.

## 2015-06-05 DIAGNOSIS — Z720 Tobacco use: Secondary | ICD-10-CM | POA: Diagnosis not present

## 2015-06-05 DIAGNOSIS — F329 Major depressive disorder, single episode, unspecified: Secondary | ICD-10-CM | POA: Diagnosis not present

## 2015-06-05 DIAGNOSIS — D649 Anemia, unspecified: Secondary | ICD-10-CM | POA: Diagnosis not present

## 2015-06-05 DIAGNOSIS — Z888 Allergy status to other drugs, medicaments and biological substances status: Secondary | ICD-10-CM | POA: Diagnosis not present

## 2015-06-05 DIAGNOSIS — Z881 Allergy status to other antibiotic agents status: Secondary | ICD-10-CM | POA: Diagnosis not present

## 2015-06-05 DIAGNOSIS — F17210 Nicotine dependence, cigarettes, uncomplicated: Secondary | ICD-10-CM | POA: Diagnosis not present

## 2015-06-05 DIAGNOSIS — J4521 Mild intermittent asthma with (acute) exacerbation: Secondary | ICD-10-CM | POA: Diagnosis not present

## 2015-06-05 DIAGNOSIS — Z8541 Personal history of malignant neoplasm of cervix uteri: Secondary | ICD-10-CM | POA: Diagnosis not present

## 2015-06-05 DIAGNOSIS — J45901 Unspecified asthma with (acute) exacerbation: Secondary | ICD-10-CM | POA: Diagnosis not present

## 2015-06-05 DIAGNOSIS — Z880 Allergy status to penicillin: Secondary | ICD-10-CM | POA: Diagnosis not present

## 2015-06-10 ENCOUNTER — Encounter: Payer: Self-pay | Admitting: Gynecologic Oncology

## 2015-06-10 ENCOUNTER — Ambulatory Visit: Payer: Medicare Other | Attending: Gynecologic Oncology | Admitting: Gynecologic Oncology

## 2015-06-10 VITALS — BP 150/88 | HR 83 | Temp 98.5°F | Resp 18 | Ht 65.5 in | Wt 189.9 lb

## 2015-06-10 DIAGNOSIS — R339 Retention of urine, unspecified: Secondary | ICD-10-CM | POA: Diagnosis not present

## 2015-06-10 DIAGNOSIS — R159 Full incontinence of feces: Secondary | ICD-10-CM

## 2015-06-10 DIAGNOSIS — C539 Malignant neoplasm of cervix uteri, unspecified: Secondary | ICD-10-CM | POA: Insufficient documentation

## 2015-06-10 DIAGNOSIS — R590 Localized enlarged lymph nodes: Secondary | ICD-10-CM

## 2015-06-10 NOTE — Patient Instructions (Signed)
You will receive a call with an appointment to see Melissa Alvarez.  Plan to followup with Dr. Denman George in 3 months as scheduled. Please call our office with any questions or concerns sooner than your scheduled appt.

## 2015-06-10 NOTE — Progress Notes (Signed)
POSTOPERATIVE FOLLOWUP Assessment:    39 y.o. year old with Stage IB1 cervical cancer.   S/p modified radical hysterectomy with bilateral pelvic lymphadenectomy on 01/19/15.  Postop bladder dysfunction/urinary retention: decreased sense of urinary urge, and fecal incontinence  Plan: 1) stage IB1 cervical cancer. Recommend continued q 3 monthly surveillance visits and annual vaginal cytology to screen for new HPV related disease. 2) Urinary retention: counseled patient about regular voiding - every 3 hours, and positioning to aid in voiding. Discussed risks of permanent bladder dysfunction if she does not void regularly. 3)  Fecal incontinence: will refer patient to see Dr Maryland Pink for evaluation and possible pelvic floor muscle training. Recommended metamucil for stool bulking. 4) right inguinal adenopathy - benign on biopsy, reducing in size.   HPI:  TAWATHA HOLIK is a 39 y.o. year old G2P2000 initially seen in consultation on 11/24/14 for cervical cancer.  She then underwent a cold knife conization on [id number] without complications.  Her postoperative course was complicated by a trip to the ER for severe pain post cone. CT imaging showed no abscess or abnormality. Her white count was mildly elevated but she was afebrile. She was discharged from the ER and I prescribed a course of doxy and flagl for presumed cervicitis. She continues to have pain postop op and so I repeated her CT imaging on 12/23/14 and this showed slightly more pelvic fluid, but no abscess. She has multiple fibroids, one of which appears to be degenerating.   Her final pathology from her cone revealed invasive moderately differentiated SCC measuring 1.19mm in greatest dimension with 1.40mm depth of invasion. There were multiple foci all less than 32mm. LVSI was present. 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 The post cone endocervical curretting was positive for fragments of squamous cell carcinoma.  On 01/19/15 she underwent  robotic assisted type II modified radical hysterectomy, with RSO and bilateral pelvic lymphadenectomy. Pathology revealed CIS but no residual carcinoma and lymph nodes were negative. She did not meet criteria for needing adjuvant therapy.  Postop she experienced severe nausea and emesis and abdominal pain in the 1st week, and required readmission for supportive care, however she resolved symptoms within 24 hours. CT imaging was unremarkable.  She developed inguinal masses postoperatively (on the right). These were biopsied for benign findings in August, 2016.  Interval Hx: she reports anal leakage, and poor sensation of a full rectum. She needs to defecate 20 minutes after eating and notes her stools are looser than usual. She has had several "accidents". These symptoms have developed since surgery.  Current Outpatient Prescriptions on File Prior to Visit  Medication Sig Dispense Refill  . albuterol (PROVENTIL HFA;VENTOLIN HFA) 108 (90 BASE) MCG/ACT inhaler Inhale 2 puffs into the lungs once. (Patient taking differently: Inhale 2 puffs into the lungs every 6 (six) hours as needed for wheezing or shortness of breath. ) 1 Inhaler 1   No current facility-administered medications on file prior to visit.    Allergies  Allergen Reactions  . Acetaminophen Hives    With codeine States she can take percocet   . Other Hives    Clinical steroids except for solu medrol  . Penicillins Hives, Itching and Rash  . Doxycycline Hives, Itching and Rash  . Erythromycin Hives, Itching and Rash  . Motrin [Ibuprofen] Hives, Itching and Rash    Past Medical History  Diagnosis Date  . Asthma   . Cancer (HCC)     cervical  . Anemia   . History  of blood transfusion     last one 2 years ago   . COPD (chronic obstructive pulmonary disease) ([place])   . Gastric ulcer     x 5  . Difficulty sleeping   . Family history of adverse reaction to anesthesia     pts daughter awaken during surgery   . Shortness of  breath dyspnea     exertion   . Numbness and tingling     right hip area following June 2016 surgery pt states has improved   . Postoperative nausea and vomiting 01/21/2015   Past Surgical History  Procedure Laterality Date  . Appendectomy    . Cholecystectomy    . Tonsillectomy    . Cervical cone biopsy      x 6  . Cesarean section      x 2  . Shoulder surgery Left   . Cervical conization w/bx N/A 12/08/2014    Procedure: COLD KNIFE CONIZATION OF CERVIX;  Surgeon: [name], MD;  Location: [facility];  Service: Gynecology;  Laterality: N/A;  . Dilation and curettage of uterus    . Robotic assisted total hysterectomy N/A 01/19/2015    Procedure: ROBOTIC ASSISTED RADICAL HYSTERECTOMY RIGHT TUBE AND OVARY, LEFT TUBE;  Surgeon: [name], MD;  Location: [facility];  Service: Gynecology;  Laterality: N/A;  . Pelvic lymph node dissection N/A 01/19/2015    Procedure: PELVIC LYMPH NODE DISSECTION;  Surgeon: [name], MD;  Location: [facility];  Service: Gynecology;  Laterality: N/A;   Family History  Problem Relation Age of Onset  . Cancer Mother   . Hypertension Mother   . Diabetes Mother   . CAD Mother   . Diabetes Other   . Hypertension Other   . Cancer Other   . CAD Other    Social History   Social History  . Marital Status: Single    Spouse Name: N/A  . Number of Children: N/A  . Years of Education: N/A   Occupational History  . Not on file.   Social History Main Topics  . Smoking status: Current Every Day Smoker -- 2.00 packs/day for 24 years    Types: Cigarettes  . Smokeless tobacco: Never Used  . Alcohol Use: No     Comment: past hx of occas use   . Drug Use: Yes    Special: Marijuana     Comment: not currently  . Sexual Activity: No   Other Topics Concern  . Not on file   Social History Narrative     Review of systems: Constitutional:  She has no weight gain or weight loss. She has no fever or chills. Eyes: No blurred vision Ears, Nose, Mouth, Throat: No  dizziness, headaches or changes in hearing. No mouth sores. Cardiovascular: No chest pain, palpitations or edema. Respiratory:  No shortness of breath, wheezing or cough Gastrointestinal: She has normal bowel movements without diarrhea or constipation. She denies any nausea or vomiting. She denies blood in her stool or heart burn. + fecal incontinence Genitourinary:  + groin pain, no bleeding Musculoskeletal: Denies muscle weakness or joint pains.  Skin:  She has no skin changes, rashes or itching Neurological:  No headache Psychiatric:  She denies depression or anxiety. Hematologic/Lymphatic:   No easy bruising or bleeding   Physical Exam: Blood pressure 150/88, pulse 83, temperature 98.5 F (36.9 C), temperature source Oral, resp. rate 18, height 5' 5.5" (1.664 m), weight 189 lb 14.4 oz (86.138 kg), last menstrual period 12/08/2014, SpO2 100 %.  General: Well dressed, well nourished in no apparent distress.   HEENT:  Normocephalic and atraumatic, no lesions.  Extraocular muscles intact. Sclerae anicteric. Pupils equal, round, reactive. No mouth sores or  Abdomen:  Soft, nontender, nondistended.  No palpable masses.  No hepatosplenomegaly.  No ascites. Normal bowel sounds.  No hernias.  Well healed incisions. Genitourinary: Normal EGBUS  Vaginal cuff in tact with no abnormal drainage. Healing normally, Rectal: grossly normal tone. In tact sphincter palpably. No masses. Extremities: No cyanosis, clubbing or edema.  No calf tenderness or erythema. No palpable cords. Psychiatric: Mood and affect are appropriate. Neurological: Awake, alert and oriented x 3. Sensation is intact, no neuropathy.  Musculoskeletal: No pain, normal strength and range of motion. Lymph nodes: right inguinal region is now 1cm palpable tender node in the mid region. No cervical adenopathy.  [name], MD

## 2015-06-11 ENCOUNTER — Telehealth: Payer: Self-pay | Admitting: *Deleted

## 2015-06-11 ENCOUNTER — Other Ambulatory Visit: Payer: Self-pay | Admitting: Gynecologic Oncology

## 2015-06-11 DIAGNOSIS — R1032 Left lower quadrant pain: Secondary | ICD-10-CM

## 2015-06-11 MED ORDER — OXYCODONE HCL 5 MG PO TABS: 5.0000 mg | ORAL_TABLET | ORAL | Status: DC | PRN

## 2015-06-11 NOTE — Telephone Encounter (Signed)
Patient presented to [facility] lobby without an appointment requesting to speak with [place] nurse. Met patient in lobby - patient states that she is having increased lower, left abdominal pain after she eats or drinks anything. Patient becomes tearful and states "It is getting to the point that I don't even want to eat or drink anything because I hate feeling like this." She states that this has been going on every since her surgery in July. Inquired if patient thinks the pain is severe enough that she needs to go to the emergency room. Patient states it is not.   Discussed with Joylene John, NP and Dr. Fermin Schwab. Prescription for #10 Oxycodone 11m tablets given to patient and instructed her to pick-up either Pepcid or Zantac OTC at the drugstore and to take as directed on the box. Instructed patient to call the clinic on Monday with an update on her status or to report to the ED over the weekend if the pain worsen or she develops any additional GI symptoms such as vomiting  - patient verbalizes understanding.

## 2015-06-11 NOTE — Progress Notes (Signed)
See RN note.

## 2015-06-11 NOTE — Telephone Encounter (Signed)
Records faxed to Dr Maryland Pink office in regards to New Patient appointment for Melissa Alvarez.

## 2015-06-15 ENCOUNTER — Telehealth: Payer: Self-pay | Admitting: *Deleted

## 2015-06-15 DIAGNOSIS — C539 Malignant neoplasm of cervix uteri, unspecified: Secondary | ICD-10-CM

## 2015-06-15 NOTE — Telephone Encounter (Signed)
Referral to [facility]  appt 06/16/15 at 245pm with Dr. Senaida Ores.  Called pt gave appt, office address and phone#. Pt confirmed and verbalized appt date/time and location. No further concerns. Pt records faxed to [facility] [phone].

## 2015-06-17 ENCOUNTER — Telehealth: Payer: Self-pay

## 2015-06-17 NOTE — Telephone Encounter (Signed)
Incoming call from [facility] [phone] with Dr Senaida Ores for left abdominal pain , will updated Dr [name] and [facility], APNP.

## 2015-07-04 HISTORY — PX: ABDOMINAL HYSTERECTOMY: SHX81

## 2015-09-06 ENCOUNTER — Encounter: Payer: Self-pay | Admitting: Gynecologic Oncology

## 2015-09-06 ENCOUNTER — Ambulatory Visit: Payer: Medicare Other | Attending: Gynecologic Oncology | Admitting: Gynecologic Oncology

## 2015-09-06 VITALS — BP 124/88 | HR 75 | Temp 98.0°F | Resp 18 | Ht 65.5 in | Wt 202.3 lb

## 2015-09-06 DIAGNOSIS — R159 Full incontinence of feces: Secondary | ICD-10-CM | POA: Diagnosis not present

## 2015-09-06 DIAGNOSIS — F17210 Nicotine dependence, cigarettes, uncomplicated: Secondary | ICD-10-CM | POA: Diagnosis not present

## 2015-09-06 DIAGNOSIS — Z880 Allergy status to penicillin: Secondary | ICD-10-CM | POA: Diagnosis not present

## 2015-09-06 DIAGNOSIS — J45909 Unspecified asthma, uncomplicated: Secondary | ICD-10-CM | POA: Insufficient documentation

## 2015-09-06 DIAGNOSIS — Z90710 Acquired absence of both cervix and uterus: Secondary | ICD-10-CM | POA: Insufficient documentation

## 2015-09-06 DIAGNOSIS — J449 Chronic obstructive pulmonary disease, unspecified: Secondary | ICD-10-CM | POA: Insufficient documentation

## 2015-09-06 DIAGNOSIS — Z8719 Personal history of other diseases of the digestive system: Secondary | ICD-10-CM | POA: Insufficient documentation

## 2015-09-06 DIAGNOSIS — R339 Retention of urine, unspecified: Secondary | ICD-10-CM

## 2015-09-06 DIAGNOSIS — C539 Malignant neoplasm of cervix uteri, unspecified: Secondary | ICD-10-CM

## 2015-09-06 DIAGNOSIS — R1909 Other intra-abdominal and pelvic swelling, mass and lump: Secondary | ICD-10-CM | POA: Diagnosis not present

## 2015-09-06 NOTE — Progress Notes (Signed)
POSTOPERATIVE FOLLOWUP Assessment:    40 y.o. year old with Stage IB1 cervical cancer.   S/p modified radical hysterectomy with bilateral pelvic lymphadenectomy on 01/19/15.  Postop bladder dysfunction/urinary retention: decreased sense of urinary urge, and fecal incontinence.  Postoperative right inguinal nodularity and pain.  Plan: 1) stage IB1 cervical cancer. Recommend continued q 3 monthly surveillance visits and annual vaginal cytology to screen for new HPV related disease. No evidence for recurrence at today's exam. 2) Urinary retention: counseled patient about regular voiding - every 3 hours, and positioning to aid in voiding. Discussed risks of permanent bladder dysfunction if she does not void regularly. 3)  Fecal incontinence: have refered patient to see Dr Maryland Pink for evaluation and possible pelvic floor muscle training. Recommended metamucil for stool bulking. 4) right inguinal adenopathy - benign on biopsy, stable but not resolving and very painful for Melissa Alvarez. I am unclear as to what the etiology of this is, and therefore I will obtain a pelvic MRI to better characterize the nodules and consider if there is a surgical remedy for these lesions. 5) return to clinic in 1 month to discuss MRI  HPI:  Melissa Alvarez is a 40 y.o. year old G2P2000 initially seen in consultation on 11/24/14 for cervical cancer.  She then underwent a cold knife conization on [id number] without complications.  Her postoperative course was complicated by a trip to the ER for severe pain post cone. CT imaging showed no abscess or abnormality. Her white count was mildly elevated but she was afebrile. She was discharged from the ER and I prescribed a course of doxy and flagl for presumed cervicitis. She continues to have pain postop op and so I repeated her CT imaging on 12/23/14 and this showed slightly more pelvic fluid, but no abscess. She has multiple fibroids, one of which appears to be degenerating.   Her  final pathology from her cone revealed invasive moderately differentiated SCC measuring 1.50mm in greatest dimension with 1.26mm depth of invasion. There were multiple foci all less than 72mm. LVSI was present. 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 The post cone endocervical curretting was positive for fragments of squamous cell carcinoma.  On 01/19/15 she underwent robotic assisted type II modified radical hysterectomy, with RSO and bilateral pelvic lymphadenectomy. Pathology revealed CIS but no residual carcinoma and lymph nodes were negative. She did not meet criteria for needing adjuvant therapy.  Postop she experienced severe nausea and emesis and abdominal pain in the 1st week, and required readmission for supportive care, however she resolved symptoms within 24 hours. CT imaging was unremarkable.  She developed inguinal masses postoperatively (on the right). These were biopsied for benign findings (benign reactive lymph node with dermatopathologic changes) in August, 2016.  In December 2016 she reported anal leakage, and poor sensation of a full rectum. She needs to defecate 20 minutes after eating and notes her stools are looser than usual. She has had several "accidents". These symptoms have developed since surgery.  Interval Hx: She presents today for routine surveillance but to also report progressive unremitting pain in the right groin. It is associated with the nodularity. She also reports fluctuating fullness in the right groin. She reports intermittent development of discoloration (blue and black) of the skin of the right upper thigh that is not related to trauma. The pain is very bothersome and limits quality of life and has caused multiple ED visits.  Current Outpatient Prescriptions on File Prior to Visit  Medication Sig Dispense Refill  .  albuterol (PROVENTIL HFA;VENTOLIN HFA) 108 (90 BASE) MCG/ACT inhaler Inhale 2 puffs into the lungs once. (Patient taking differently: Inhale 2 puffs  into the lungs every 6 (six) hours as needed for wheezing or shortness of breath. ) 1 Inhaler 1  . oxyCODONE (OXY IR/ROXICODONE) 5 MG immediate release tablet Take 1 tablet (5 mg total) by mouth every 4 (four) hours as needed for severe pain. (Patient not taking: Reported on 09/06/2015) 10 tablet 0   No current facility-administered medications on file prior to visit.    Allergies  Allergen Reactions  . Acetaminophen Hives    With codeine States she can take percocet   . Other Hives    Clinical steroids except for solu medrol  . Penicillins Hives, Itching and Rash  . Doxycycline Hives, Itching and Rash  . Erythromycin Hives, Itching and Rash  . Motrin [Ibuprofen] Hives, Itching and Rash    Past Medical History  Diagnosis Date  . Asthma   . Cancer (HCC)     cervical  . Anemia   . History of blood transfusion     last one 2 years ago   . COPD (chronic obstructive pulmonary disease) ([place])   . Gastric ulcer     x 5  . Difficulty sleeping   . Family history of adverse reaction to anesthesia     pts daughter awaken during surgery   . Shortness of breath dyspnea     exertion   . Numbness and tingling     right hip area following June 2016 surgery pt states has improved   . Postoperative nausea and vomiting 01/21/2015   Past Surgical History  Procedure Laterality Date  . Appendectomy    . Cholecystectomy    . Tonsillectomy    . Cervical cone biopsy      x 6  . Cesarean section      x 2  . Shoulder surgery Left   . Cervical conization w/bx N/A 12/08/2014    Procedure: COLD KNIFE CONIZATION OF CERVIX;  Surgeon: [name], MD;  Location: [facility];  Service: Gynecology;  Laterality: N/A;  . Dilation and curettage of uterus    . Robotic assisted total hysterectomy N/A 01/19/2015    Procedure: ROBOTIC ASSISTED RADICAL HYSTERECTOMY RIGHT TUBE AND OVARY, LEFT TUBE;  Surgeon: [name], MD;  Location: [facility];  Service: Gynecology;  Laterality: N/A;  . Pelvic lymph node dissection N/A  01/19/2015    Procedure: PELVIC LYMPH NODE DISSECTION;  Surgeon: [name], MD;  Location: [facility];  Service: Gynecology;  Laterality: N/A;   Family History  Problem Relation Age of Onset  . Cancer Mother   . Hypertension Mother   . Diabetes Mother   . CAD Mother   . Diabetes Other   . Hypertension Other   . Cancer Other   . CAD Other    Social History   Social History  . Marital Status: Single    Spouse Name: N/A  . Number of Children: N/A  . Years of Education: N/A   Occupational History  . Not on file.   Social History Main Topics  . Smoking status: Current Every Day Smoker -- 2.00 packs/day for 24 years    Types: Cigarettes  . Smokeless tobacco: Never Used  . Alcohol Use: No     Comment: past hx of occas use   . Drug Use: Yes    Special: Marijuana     Comment: not currently  . Sexual Activity: No  Other Topics Concern  . Not on file   Social History Narrative     Review of systems: Constitutional:  She has no weight gain or weight loss. She has no fever or chills. Eyes: No blurred vision Ears, Nose, Mouth, Throat: No dizziness, headaches or changes in hearing. No mouth sores. Cardiovascular: No chest pain, palpitations or edema. Respiratory:  No shortness of breath, wheezing or cough Gastrointestinal: She has normal bowel movements without diarrhea or constipation. She denies any nausea or vomiting. She denies blood in her stool or heart burn. + fecal incontinence Genitourinary:  + groin pain, no bleeding Musculoskeletal: Denies muscle weakness or joint pains.  Skin:  She has no skin changes, rashes or itching Neurological:  No headache Psychiatric:  She denies depression or anxiety. Hematologic/Lymphatic:   No easy bruising or bleeding   Physical Exam: Blood pressure 124/88, pulse 75, temperature 98 F (36.7 C), temperature source Oral, resp. rate 18, height 5' 5.5" (1.664 m), weight 202 lb 4.8 oz (91.763 kg), last menstrual period 12/08/2014, SpO2 100  %. General: Well dressed, well nourished in no apparent distress.   HEENT:  Normocephalic and atraumatic, no lesions.  Extraocular muscles intact. Sclerae anicteric. Pupils equal, round, reactive. No mouth sores or  Abdomen:  Soft, nontender, nondistended.  No palpable masses.  No hepatosplenomegaly.  No ascites. Normal bowel sounds.  No hernias.  Well healed incisions. Genitourinary: Normal EGBUS  Vaginal cuff in tact with no abnormal drainage. Healing normally, no masses or lesions. Rectal: grossly normal tone. In tact sphincter palpably. No masses. Extremities: No cyanosis, clubbing or edema.  No calf tenderness or erythema. No palpable cords. Psychiatric: Mood and affect are appropriate. Neurological: Awake, alert and oriented x 3. Sensation is intact, no neuropathy.  Musculoskeletal: No pain, normal strength and range of motion. Lymph nodes: right inguinal region is now 1cm palpable tender node in the mid region. There are some additional nodules along the chain in the groin. No cervical adenopathy.  [name], MD

## 2015-09-06 NOTE — Patient Instructions (Signed)
Plan to have an MRI and follow up with Dr. Denman George in one month.  Please call for any questions or concerns.

## 2015-09-11 ENCOUNTER — Emergency Department (HOSPITAL_COMMUNITY): Payer: Medicare Other

## 2015-09-11 ENCOUNTER — Encounter (HOSPITAL_COMMUNITY): Payer: Self-pay | Admitting: *Deleted

## 2015-09-11 ENCOUNTER — Emergency Department (HOSPITAL_COMMUNITY)
Admission: EM | Admit: 2015-09-11 | Discharge: 2015-09-11 | Disposition: A | Discharge status: Home / Self Care | Payer: Medicare Other | Attending: Emergency Medicine | Admitting: Emergency Medicine

## 2015-09-11 DIAGNOSIS — R109 Unspecified abdominal pain: Secondary | ICD-10-CM | POA: Insufficient documentation

## 2015-09-11 DIAGNOSIS — G8929 Other chronic pain: Secondary | ICD-10-CM | POA: Diagnosis not present

## 2015-09-11 DIAGNOSIS — J441 Chronic obstructive pulmonary disease with (acute) exacerbation: Secondary | ICD-10-CM | POA: Diagnosis not present

## 2015-09-11 DIAGNOSIS — Z862 Personal history of diseases of the blood and blood-forming organs and certain disorders involving the immune mechanism: Secondary | ICD-10-CM | POA: Diagnosis not present

## 2015-09-11 DIAGNOSIS — M79604 Pain in right leg: Secondary | ICD-10-CM | POA: Insufficient documentation

## 2015-09-11 DIAGNOSIS — Z880 Allergy status to penicillin: Secondary | ICD-10-CM | POA: Insufficient documentation

## 2015-09-11 DIAGNOSIS — Z79899 Other long term (current) drug therapy: Secondary | ICD-10-CM | POA: Diagnosis not present

## 2015-09-11 DIAGNOSIS — J45901 Unspecified asthma with (acute) exacerbation: Secondary | ICD-10-CM | POA: Diagnosis not present

## 2015-09-11 DIAGNOSIS — F17210 Nicotine dependence, cigarettes, uncomplicated: Secondary | ICD-10-CM | POA: Insufficient documentation

## 2015-09-11 DIAGNOSIS — R1032 Left lower quadrant pain: Secondary | ICD-10-CM

## 2015-09-11 DIAGNOSIS — Z8541 Personal history of malignant neoplasm of cervix uteri: Secondary | ICD-10-CM | POA: Diagnosis not present

## 2015-09-11 DIAGNOSIS — Z8719 Personal history of other diseases of the digestive system: Secondary | ICD-10-CM | POA: Insufficient documentation

## 2015-09-11 DIAGNOSIS — M79601 Pain in right arm: Secondary | ICD-10-CM

## 2015-09-11 DIAGNOSIS — R1903 Right lower quadrant abdominal swelling, mass and lump: Secondary | ICD-10-CM | POA: Insufficient documentation

## 2015-09-11 DIAGNOSIS — R05 Cough: Secondary | ICD-10-CM | POA: Diagnosis not present

## 2015-09-11 DIAGNOSIS — R0602 Shortness of breath: Secondary | ICD-10-CM | POA: Diagnosis not present

## 2015-09-11 LAB — BASIC METABOLIC PANEL
ANION GAP: 8 (ref 5–15)
BUN: 6 mg/dL (ref 6–20)
CALCIUM: 8.6 mg/dL — AB (ref 8.9–10.3)
CO2: 26 mmol/L (ref 22–32)
Chloride: 107 mmol/L (ref 101–111)
Creatinine, Ser: 0.72 mg/dL (ref 0.44–1.00)
GLUCOSE: 101 mg/dL — AB (ref 65–99)
Potassium: 3.6 mmol/L (ref 3.5–5.1)
SODIUM: 141 mmol/L (ref 135–145)

## 2015-09-11 LAB — CBC
HCT: 39.2 % (ref 36.0–46.0)
HEMOGLOBIN: 13 g/dL (ref 12.0–15.0)
MCH: 27.4 pg (ref 26.0–34.0)
MCHC: 33.2 g/dL (ref 30.0–36.0)
MCV: 82.7 fL (ref 78.0–100.0)
PLATELETS: 206 10*3/uL (ref 150–400)
RBC: 4.74 MIL/uL (ref 3.87–5.11)
RDW: 17.6 % — AB (ref 11.5–15.5)
WBC: 9.6 10*3/uL (ref 4.0–10.5)

## 2015-09-11 LAB — I-STAT TROPONIN, ED: Troponin i, poc: 0 ng/mL (ref 0.00–0.08)

## 2015-09-11 MED ORDER — ALBUTEROL SULFATE HFA 108 (90 BASE) MCG/ACT IN AERS: 2.0000 | INHALATION_SPRAY | Freq: Once | RESPIRATORY_TRACT | Status: AC

## 2015-09-11 MED ORDER — ALBUTEROL SULFATE HFA 108 (90 BASE) MCG/ACT IN AERS
1.0000 | INHALATION_SPRAY | Freq: Four times a day (QID) | RESPIRATORY_TRACT | Status: DC | PRN
  Administered 2015-09-11: 1 via RESPIRATORY_TRACT
  Filled 2015-09-11: qty 6.7

## 2015-09-11 MED ORDER — ALBUTEROL SULFATE (2.5 MG/3ML) 0.083% IN NEBU
5.0000 mg | INHALATION_SOLUTION | Freq: Once | RESPIRATORY_TRACT | Status: AC
  Administered 2015-09-11: 5 mg via RESPIRATORY_TRACT
  Filled 2015-09-11: qty 6

## 2015-09-11 MED ORDER — CYCLOBENZAPRINE HCL 10 MG PO TABS
10.0000 mg | ORAL_TABLET | Freq: Once | ORAL | Status: AC
  Administered 2015-09-11: 10 mg via ORAL
  Filled 2015-09-11: qty 1

## 2015-09-11 MED ORDER — OXYCODONE HCL 5 MG PO TABS
5.0000 mg | ORAL_TABLET | Freq: Once | ORAL | Status: AC
  Administered 2015-09-11: 5 mg via ORAL
  Filled 2015-09-11: qty 1

## 2015-09-11 MED ORDER — OXYCODONE HCL 5 MG PO TABS: 5.0000 mg | ORAL_TABLET | ORAL | Status: DC | PRN

## 2015-09-11 NOTE — Discharge Instructions (Signed)
Asthma Attack Prevention While you may not be able to control the fact that you have asthma, you can take actions to prevent asthma attacks. The best way to prevent asthma attacks is to maintain good control of your asthma. You can achieve this by:  Taking your medicines as directed.  Avoiding things that can irritate your airways or make your asthma symptoms worse (asthma triggers).  Keeping track of how well your asthma is controlled and of any changes in your symptoms.  Responding quickly to worsening asthma symptoms (asthma attack).  Seeking emergency care when it is needed. WHAT ARE SOME WAYS TO PREVENT AN ASTHMA ATTACK? Have a Plan Work with your health care provider to create a written plan for managing and treating your asthma attacks (asthma action plan). This plan includes:  A list of your asthma triggers and how you can avoid them.  Information on when medicines should be taken and when their dosages should be changed.  The use of a device that measures how well your lungs are working (peak flow meter). Monitor Your Asthma Use your peak flow meter and record your results in a journal every day. A drop in your peak flow numbers on one or more days may indicate the start of an asthma attack. This can happen even before you start to feel symptoms. You can prevent an asthma attack from getting worse by following the steps in your asthma action plan. Avoid Asthma Triggers Work with your asthma health care provider to find out what your asthma triggers are. This can be done by:  Allergy testing.  Keeping a journal that notes when asthma attacks occur and the factors that may have contributed to them.  Determining if there are other medical conditions that are making your asthma worse. Once you have determined your asthma triggers, take steps to avoid them. This may include avoiding excessive or prolonged exposure to:  Dust. Have someone dust and vacuum your home for you once or  twice a week. Using a high-efficiency particulate arrestance (HEPA) vacuum is best.  Smoke. This includes campfire smoke, forest fire smoke, and secondhand smoke from tobacco products.  Pet dander. Avoid contact with animals that you know you are allergic to.  Allergens from trees, grasses or pollens. Avoid spending a lot of time outdoors when pollen counts are high, and on very windy days.  Very cold, dry, or humid air.  Mold.  Foods that contain high amounts of sulfites.  Strong odors.  Outdoor air pollutants, such as engine exhaust.  Indoor air pollutants, such as aerosol sprays and fumes from household cleaners.  Household pests, including dust mites and cockroaches, and pest droppings.  Certain medicines, including NSAIDs. Always talk to your health care provider before stopping or starting any new medicines. Medicines Take over-the-counter and prescription medicines only as told by your health care provider. Many asthma attacks can be prevented by carefully following your medicine schedule. Taking your medicines correctly is especially important when you cannot avoid certain asthma triggers. Act Quickly If an asthma attack does happen, acting quickly can decrease how severe it is and how long it lasts. Take these steps:   Pay attention to your symptoms. If you are coughing, wheezing, or having difficulty breathing, do not wait to see if your symptoms go away on their own. Follow your asthma action plan.  If you have followed your asthma action plan and your symptoms are not improving, call your health care provider or seek immediate medical care   at the nearest hospital. It is important to note how often you need to use your fast-acting rescue inhaler. If you are using your rescue inhaler more often, it may mean that your asthma is not under control. Adjusting your asthma treatment plan may help you to prevent future asthma attacks and help you to gain better control of your  condition. HOW CAN I PREVENT AN ASTHMA ATTACK WHEN I EXERCISE? Follow advice from your health care provider about whether you should use your fast-acting inhaler before exercising. Many people with asthma experience exercise-induced bronchoconstriction (EIB). This condition often worsens during vigorous exercise in cold, humid, or dry environments. Usually, people with EIB can stay very active by pre-treating with a fast-acting inhaler before exercising.   This information is not intended to replace advice given to you by your health care provider. Make sure you discuss any questions you have with your health care provider.   Document Released: 06/07/2009 Document Revised: 03/10/2015 Document Reviewed: 11/19/2014 Elsevier Interactive Patient Education 2016 [employer]  

## 2015-09-11 NOTE — ED Notes (Signed)
Pt reports hx of asthma, exacerbation that began this morning - denies fever or cough.

## 2015-09-11 NOTE — ED Provider Notes (Signed)
CSN: [id number]     Arrival date & time 09/11/15  1540 History   First MD Initiated Contact with Patient 09/11/15 1844     Chief Complaint  Patient presents with  . Asthma     (Consider location/radiation/quality/duration/timing/severity/associated sxs/prior Treatment) HPI Comments: Patient is a 40 year old female with a history of cervical cancer resulting in a hysterectomy and removal of one ovary in June who developed a lump in her pelvic area several months after surgery that has been persistent. She had a biopsy of this area done by her OB/GYN and they were concern for cancer. She has scheduled an appointment on April 3 2 and evaluation to see if it can be surgically removed. However she ran of her pain medicine several days ago when she went to the office today her doctor was not in the office and they recommended her to come here to get pain medication. She denies any change in her symptoms since August denies any redness, fever or swelling of her leg.  Patient is a 40 y.o. female presenting with asthma. The history is provided by the patient.  Asthma This is a chronic problem. The current episode started 6 to 12 hours ago. The problem occurs constantly. The problem has not changed since onset.Associated symptoms comments: Cough, wheeze that started today because she ran out of her inhaler. She denies shortness of breath or fever. She feels better after getting albuterol and the waiting room.. The symptoms are aggravated by walking. She has tried nothing for the symptoms. The treatment provided significant relief.    Past Medical History  Diagnosis Date  . Asthma   . Cancer (HCC)     cervical  . Anemia   . History of blood transfusion     last one 2 years ago   . COPD (chronic obstructive pulmonary disease) ([place])   . Gastric ulcer     x 5  . Difficulty sleeping   . Family history of adverse reaction to anesthesia     pts daughter awaken during surgery   . Shortness of breath  dyspnea     exertion   . Numbness and tingling     right hip area following June 2016 surgery pt states has improved   . Postoperative nausea and vomiting 01/21/2015   Past Surgical History  Procedure Laterality Date  . Appendectomy    . Cholecystectomy    . Tonsillectomy    . Cervical cone biopsy      x 6  . Cesarean section      x 2  . Shoulder surgery Left   . Cervical conization w/bx N/A 12/08/2014    Procedure: COLD KNIFE CONIZATION OF CERVIX;  Surgeon: [name], MD;  Location: [facility];  Service: Gynecology;  Laterality: N/A;  . Dilation and curettage of uterus    . Robotic assisted total hysterectomy N/A 01/19/2015    Procedure: ROBOTIC ASSISTED RADICAL HYSTERECTOMY RIGHT TUBE AND OVARY, LEFT TUBE;  Surgeon: [name], MD;  Location: [facility];  Service: Gynecology;  Laterality: N/A;  . Pelvic lymph node dissection N/A 01/19/2015    Procedure: PELVIC LYMPH NODE DISSECTION;  Surgeon: [name], MD;  Location: [facility];  Service: Gynecology;  Laterality: N/A;   Family History  Problem Relation Age of Onset  . Cancer Mother   . Hypertension Mother   . Diabetes Mother   . CAD Mother   . Diabetes Other   . Hypertension Other   . Cancer Other   .  CAD Other    Social History  Substance Use Topics  . Smoking status: Current Every Day Smoker -- 2.00 packs/day for 24 years    Types: Cigarettes  . Smokeless tobacco: Never Used  . Alcohol Use: No     Comment: past hx of occas use    OB History    Gravida Para Term Preterm AB TAB SAB Ectopic Multiple Living   2 2 2             Review of Systems  All other systems reviewed and are negative.     Allergies  Acetaminophen; Other; Penicillins; Doxycycline; Erythromycin; and Motrin  Home Medications   Prior to Admission medications   Medication Sig Start Date End Date Taking? Authorizing Provider  albuterol (PROVENTIL HFA;VENTOLIN HFA) 108 (90 Base) MCG/ACT inhaler Inhale 2 puffs into the lungs once. 09/11/15   Blanchie Dessert, MD  oxyCODONE (OXY IR/ROXICODONE) 5 MG immediate release tablet Take 1 tablet (5 mg total) by mouth every 4 (four) hours as needed for severe pain. 09/11/15   Blanchie Dessert, MD   BP 139/80 mmHg  Pulse 88  Temp(Src) 98.2 F (36.8 C) (Oral)  Resp 16  SpO2 100%  LMP 12/08/2014 (Exact Date) Physical Exam  Constitutional: She is oriented to person, place, and time. She appears well-developed and well-nourished. No distress.  HENT:  Head: Normocephalic and atraumatic.  Mouth/Throat: Oropharynx is clear and moist.  Eyes: Conjunctivae and EOM are normal. Pupils are equal, round, and reactive to light.  Neck: Normal range of motion. Neck supple.  Cardiovascular: Normal rate, regular rhythm and intact distal pulses.   No murmur heard. Pulmonary/Chest: Effort normal. No respiratory distress. She has wheezes. She has no rales.  Wheezing that clears with cough  Abdominal: Soft. She exhibits no distension. There is tenderness. There is no rebound and no guarding.    Musculoskeletal: Normal range of motion. She exhibits no edema or tenderness.  No notable lower extremity swelling or tenderness  Neurological: She is alert and oriented to person, place, and time.  Skin: Skin is warm and dry. No rash noted. No erythema.  Psychiatric: She has a normal mood and affect. Her behavior is normal.  Nursing note and vitals reviewed.   ED Course  Procedures (including critical care time) Labs Review Labs Reviewed  BASIC METABOLIC PANEL - Abnormal; Notable for the following:    Glucose, Bld 101 (*)    Calcium 8.6 (*)    All other components within normal limits  CBC - Abnormal; Notable for the following:    RDW 17.6 (*)    All other components within normal limits  Randolm Idol, ED    Imaging Review Dg Chest 2 View  09/11/2015  CLINICAL DATA:  40 year old female with history of cough, chest pain and shortness of breath. EXAM: CHEST  2 VIEW COMPARISON:  Chest x-ray 01/21/2015.  FINDINGS: Lung volumes are normal. No consolidative airspace disease. No pleural effusions. No pneumothorax. No pulmonary nodule or mass noted. Pulmonary vasculature and the cardiomediastinal silhouette are within normal limits. IMPRESSION: No radiographic evidence of acute cardiopulmonary disease. Electronically Signed   By: Vinnie Langton M.D.   On: 09/11/2015 17:11   I have personally reviewed and evaluated these images and lab results as part of my medical decision-making.   EKG Interpretation   Date/Time:  Saturday September 11 2015 16:44:44 EST Ventricular Rate:  98 PR Interval:  133 QRS Duration: 118 QT Interval:  314 QTC Calculation: 401 R Axis:  78 Text Interpretation:  Sinus rhythm Nonspecific intraventricular conduction  delay Nonspecific T abnrm, anterolateral leads Baseline wander in lead(s)  II III aVF V5 T wave inversion in inferior leads more pronounced than  prior Confirmed by [facility]  MD, Spiros Greenfeld ([id number]) on 09/11/2015 6:46:01 PM      MDM   Final diagnoses:  Asthma exacerbation  Chronic leg pain, right  Patient coming for 2 reasons. Initially is for an asthma exacerbation that began this morning. She ran out of her inhaler 5 days ago and has had cough and wheezing. She denies fever or shortness of breath. She received an albuterol treatment prior to being seen and now only has minor wheezing that clears with cough. Has a normal chest x-ray and low suspicion for infection. Patient was sent home with albuterol inhaler.  Secondly patient is complaining of pain in her pelvic area. She recently had surgery in June for cervical cancer and then had this area appear after her surgery. She is following with GYN who biopsied this area and there was concern for cancer. They're planning a surgical procedure in April however she is out of her pain medication and went to the office today but they instructed her to come here because her doctor was not there.  She denies any change in  her symptoms than what has been ongoing. Low suspicion for DVT or infection. Labs without acute finding.  Blanchie Dessert, MD 09/11/15 2032

## 2015-09-11 NOTE — ED Notes (Signed)
Patient refused cardiac monitoring. 

## 2015-09-14 ENCOUNTER — Ambulatory Visit (HOSPITAL_COMMUNITY)
Admission: RE | Admit: 2015-09-14 | Discharge: 2015-09-14 | Disposition: A | Discharge status: Home / Self Care | Payer: Medicare Other | Source: Ambulatory Visit | Attending: Gynecologic Oncology | Admitting: Gynecologic Oncology

## 2015-09-14 DIAGNOSIS — C539 Malignant neoplasm of cervix uteri, unspecified: Secondary | ICD-10-CM | POA: Diagnosis not present

## 2015-09-14 DIAGNOSIS — Z90710 Acquired absence of both cervix and uterus: Secondary | ICD-10-CM | POA: Diagnosis not present

## 2015-09-14 DIAGNOSIS — N838 Other noninflammatory disorders of ovary, fallopian tube and broad ligament: Secondary | ICD-10-CM | POA: Diagnosis not present

## 2015-09-14 DIAGNOSIS — R1909 Other intra-abdominal and pelvic swelling, mass and lump: Secondary | ICD-10-CM | POA: Diagnosis not present

## 2015-09-14 DIAGNOSIS — R1030 Lower abdominal pain, unspecified: Secondary | ICD-10-CM | POA: Diagnosis not present

## 2015-09-14 MED ORDER — GADOBENATE DIMEGLUMINE 529 MG/ML IV SOLN
20.0000 mL | Freq: Once | INTRAVENOUS | Status: AC | PRN
  Administered 2015-09-14: 19 mL via INTRAVENOUS

## 2015-09-20 ENCOUNTER — Telehealth: Payer: Self-pay | Admitting: *Deleted

## 2015-09-20 ENCOUNTER — Other Ambulatory Visit: Payer: Self-pay | Admitting: Gynecologic Oncology

## 2015-09-20 DIAGNOSIS — R1031 Right lower quadrant pain: Secondary | ICD-10-CM

## 2015-09-20 DIAGNOSIS — C539 Malignant neoplasm of cervix uteri, unspecified: Secondary | ICD-10-CM

## 2015-09-20 NOTE — Telephone Encounter (Signed)
Per MD notified pt nothing on MRI to explain groin pain and swelling. MD has made a referral to the Lymphedema Clinic to see if this would help. Request pt call back to confirm message was received.

## 2015-09-20 NOTE — Progress Notes (Signed)
Per Dr. Denman George since the MRI did not show any findings to point to the cause of her groin pain.  Referral made to lymphedema clinic per Dr. Denman George to see if lymphedema may be contributing to her pain.

## 2015-09-23 ENCOUNTER — Ambulatory Visit: Payer: Medicare Other | Attending: Gynecologic Oncology | Admitting: Physical Therapy

## 2015-09-23 DIAGNOSIS — I890 Lymphedema, not elsewhere classified: Secondary | ICD-10-CM | POA: Diagnosis not present

## 2015-09-23 DIAGNOSIS — R531 Weakness: Secondary | ICD-10-CM | POA: Insufficient documentation

## 2015-09-23 DIAGNOSIS — Z9181 History of falling: Secondary | ICD-10-CM

## 2015-09-23 DIAGNOSIS — Z9189 Other specified personal risk factors, not elsewhere classified: Secondary | ICD-10-CM | POA: Insufficient documentation

## 2015-09-24 ENCOUNTER — Ambulatory Visit: Payer: Medicare Other | Admitting: Physical Therapy

## 2015-09-24 ENCOUNTER — Other Ambulatory Visit: Payer: Self-pay | Admitting: Gynecologic Oncology

## 2015-09-24 DIAGNOSIS — R1909 Other intra-abdominal and pelvic swelling, mass and lump: Secondary | ICD-10-CM

## 2015-09-24 DIAGNOSIS — R1032 Left lower quadrant pain: Secondary | ICD-10-CM

## 2015-09-24 MED ORDER — OXYCODONE HCL 5 MG PO TABS: 5.0000 mg | ORAL_TABLET | ORAL | Status: DC | PRN

## 2015-09-24 NOTE — Therapy (Signed)
[place], [place], [address] Phone: [phone]   Fax:  [phone]  Physical Therapy Evaluation  Patient Details  Name: Melissa Alvarez MRN: [id number] Date of Birth: Nov 25, 1975 Referring Provider: Dr. Denman George   Encounter Date: 09/23/2015      PT End of Session - 09/24/15 0741    Visit Number 1   Number of Visits 13   Date for PT Re-Evaluation 11/01/15   PT Start Time [id number]   PT Stop Time 1600   PT Time Calculation (min) 45 min   Activity Tolerance Patient limited by pain   Behavior During Therapy [facility] for tasks assessed/performed      Past Medical History  Diagnosis Date  . Asthma   . Cancer (HCC)     cervical  . Anemia   . History of blood transfusion     last one 2 years ago   . COPD (chronic obstructive pulmonary disease) ([place])   . Gastric ulcer     x 5  . Difficulty sleeping   . Family history of adverse reaction to anesthesia     pts daughter awaken during surgery   . Shortness of breath dyspnea     exertion   . Numbness and tingling     right hip area following June 2016 surgery pt states has improved   . Postoperative nausea and vomiting 01/21/2015    Past Surgical History  Procedure Laterality Date  . Appendectomy    . Cholecystectomy    . Tonsillectomy    . Cervical cone biopsy      x 6  . Cesarean section      x 2  . Shoulder surgery Left   . Cervical conization w/bx N/A 12/08/2014    Procedure: COLD KNIFE CONIZATION OF CERVIX;  Surgeon: [name], MD;  Location: [facility];  Service: Gynecology;  Laterality: N/A;  . Dilation and curettage of uterus    . Robotic assisted total hysterectomy N/A 01/19/2015    Procedure: ROBOTIC ASSISTED RADICAL HYSTERECTOMY RIGHT TUBE AND OVARY, LEFT TUBE;  Surgeon: [name], MD;  Location: [facility];  Service: Gynecology;  Laterality: N/A;  . Pelvic lymph node dissection N/A 01/19/2015    Procedure: PELVIC LYMPH NODE DISSECTION;  Surgeon:  [name], MD;  Location: [facility];  Service: Gynecology;  Laterality: N/A;    There were no vitals filed for this visit.  Visit Diagnosis:  Lymphedema - Plan: PT plan of care cert/re-cert  At risk for injury related to fall - Plan: PT plan of care cert/re-cert  Generalized weakness - Plan: PT plan of care cert/re-cert      Subjective Assessment - 09/23/15 1527    Subjective swelling increases during the course  of the day but is less in the morning.  she is more bothered by pain "Its pretty good right now because I just got up"   Pertinent History hysterectomy with right ovary removed in August 2016 with symptoms of pain in groin    Patient Stated Goals to get rid of pain and swelling    Currently in Pain? Yes   Pain Score 3    Pain Location Groin   Pain Orientation Right   Pain Descriptors / Indicators Aching   Pain Radiating Towards tingly down the leg    Pain Onset More than a month ago   Pain Frequency Constant   Aggravating Factors  walking alot of moving the leg, using gas pedal  Pain Relieving Factors prescription med and downward position of the leg to take pressure off the groin    Effect of Pain on Daily Activities limits drivings             [facility] PT Assessment - 09/24/15 0001    Assessment   Medical Diagnosis cervical cancer    Referring Provider Dr. Denman George    Onset Date/Surgical Date 02/23/15   Hand Dominance Right   Precautions   Precautions Fall   Precaution Comments feels that leg gives out,    Restrictions   Weight Bearing Restrictions No   Balance Screen   Has the patient fallen in the past 6 months Yes   How many times? once, leg gave out due to pain and goes numb    Has the patient had a decrease in activity level because of a fear of falling?  Yes   Is the patient reluctant to leave their home because of a fear of falling?  Yes   [place] residence   Living Arrangements Spouse/significant other;Children   pt reports she has multiple family members closely involved    Available Help at Discharge Family;Friend(s)   Type of [place] to enter   Entrance Stairs-Number of Steps concrete slabs spaced far apart to enter, but pt states she has to walk up on grass holding onto someone to enter    [place] One level   Prior Function   Level of Independence Independent  hard to get in and out of tub    Vocation On disability  on disability due to asthma    Leisure helps with grandchildren    Cognition   Overall Cognitive Status Within Functional Limits for tasks assessed   Observation/Other Assessments   Observations Pt comes into treatment room with daughter and finance. They are visibly upset and concerned about patient's condition.    Skin Integrity no open areas    Other Surveys  Other Surveys  Lymphedema Life Imact Scale 55 or 81% impairment    Sensation   Light Touch Impaired by gross assessment   Coordination   Gross Motor Movements are Fluid and Coordinated No  moves slowly due to pain    Functional Tests   Functional tests Other   Sit to Stand   Comments 6 reps in 30 seconds that caused increased pain in groin and down leg    Posture/Postural Control   Posture/Postural Control Postural limitations   Postural Limitations Rounded Shoulders;Forward head   Strength   Overall Strength Comments pt appears to have general deconditioning    Right Hip Flexion 2+/5  pain   Left Hip Flexion 4/5   Right Knee Extension 4/5   Left Knee Extension 4/5   Left Ankle Dorsiflexion 5/5   Left Ankle Plantar Flexion 5/5           LYMPHEDEMA/ONCOLOGY QUESTIONNAIRE - 09/24/15 0735    Type   Cancer Type cervical    Date Lymphedema/Swelling Started   Date 03/04/15  approximate   Treatment   Past Radiation Treatment Yes   Date 07/03/96  approximate date   Body Site pelvis    What other symptoms do you have   Are you Having Heaviness or Tightness Yes   Are  you having Pain Yes   Are you having pitting edema Yes   Body Site right thigh.... per pt report, not present at eval    Is it  Hard or Difficult finding clothes that fit Yes  pt says she gets binding from panties   Do you have infections No   [place] Sign No   Lymphedema Stage   Stage STAGE 1 SPONTANEOUSLY REVERSIBLE  much better when she first gets up after lying down   Lymphedema Assessments   Lymphedema Assessments Lower extremities   Right Lower Extremity Lymphedema   30 cm Proximal to Suprapatella 73.5 cm   20 cm Proximal to Suprapatella 65.5 cm   10 cm Proximal to Suprapatella 56 cm   At Midpatella/Popliteal Crease 37 cm   30 cm Proximal to Floor at Lateral Plantar Foot 37.5 cm   20 cm Proximal to Floor at Lateral Plantar Foot 26.7 1   10  cm Proximal to Floor at Lateral Malleoli 21 cm   5 cm Proximal to 1st MTP Joint 23 cm   Around Proximal Great Toe 8 cm   Left Lower Extremity Lymphedema   30 cm Proximal to Suprapatella 68.4 cm   20 cm Proximal to Suprapatella 66 cm   10 cm Proximal to Suprapatella 54.5 cm   At Midpatella/Popliteal Crease 36 cm   30 cm Proximal to Floor at Lateral Plantar Foot 38 cm   20 cm Proximal to Floor at Lateral Plantar Foot 28 cm   10 cm Proximal to Floor at Lateral Malleoli 21.5 cm   5 cm Proximal to 1st MTP Joint 23 cm   Around Proximal Great Toe 8 cm   Other pt reports she feels swelling in genital region and abdomen did not measure at this time                                 [facility] Goals - 09/24/15 0747    CC Long Term Goal  #1   Title Patient with verbalize an understanding of lymphedema risk reduction precautions   Time 4   Period Weeks   Status New   CC Long Term Goal  #2   Title Patient will be independent in a exercise program   Time 4   Period Weeks   Status New   CC Long Term Goal  #3   Title Patient will be know how to obtain and use compression garments for maintenance phase of treatment    Time 4   Period Weeks   Status New   CC Long Term Goal  #4   Title Patient will report a decrease in pain by 50% so they can perform daily activities with greater ease   Time 4   Period Weeks   Status New   CC Long Term Goal  #5   Title pt will have decrease in lymphedema life impact scale to 40% indicating an improvement in Lymphedema affect on her life   Time 4   Period Weeks   Status New   CC Long Term Goal  #6   Title Pt will increase repetitions of sit to stand to 10 in 30 seconds indicating and improvement in functional strength and decrease risk of fall    Time 4   Period Weeks   Status New   Additional Goals   Additional Goals Yes            Plan - 09/24/15 0741    Clinical Impression Statement 39 you female in distress from pain and swelling in right leg with report of intemittent numbnes and weakness interfering with  life activities comes to physical therapy. Symptoms began after surgery last August and are progressing    Pt will benefit from skilled therapeutic intervention in order to improve on the following deficits Decreased activity tolerance;Decreased knowledge of precautions;Impaired perceived functional ability;Pain;Decreased strength;Increased edema   Rehab Potential Good   Clinical Impairments Affecting Rehab Potential previous radiation and surgery affecting pelvic lymph nodes    PT Next Visit Plan continue assessment of abdominal scar mobility and measure widest part of abdomen Manual lymph drianage,  Issue Celine Ahr DVD if pt is interested    Consulted and Agree with Plan of Care Patient;Family member/caregiver   Family Member Consulted fiance and daughter           G-Codes - 10-14-15 0753    Functional Assessment Tool Used Lymphedema Life Impact Scale  55 or 81% impact    Functional Limitation Self care   Self Care Current Status [phone]) At least 80 percent but less than 100 percent impaired, limited or restricted   Self Care Goal Status [id number]) At  least 40 percent but less than 60 percent impaired, limited or restricted       Problem List Patient Active Problem List   Diagnosis Date Noted  . Urinary retention with incomplete bladder emptying 02/22/2015  . Groin mass in female 02/22/2015  . Postoperative nausea and vomiting 01/21/2015  . Cervical cancer, FIGO stage IB1 ([place]) 01/19/2015  . Cervical cancer ([place])    Donato Heinz. Owens Shark, PT  09/24/2015, 7:56 AM  [place], [place], [address] Phone: [phone]   Fax:  [phone]  Name: TAMBI ODANIEL MRN: [id number] Date of Birth: 09-06-1975

## 2015-09-24 NOTE — Progress Notes (Signed)
Patient called reporting moderate right leg pain.  She is set up to see a therapist at the lymphedema clinic.  Refill given.  Reportable signs and symptoms reviewed.  Pt recently evaluated at appt with Dr. Denman George.

## 2015-09-28 ENCOUNTER — Ambulatory Visit: Payer: Medicare Other | Admitting: Physical Therapy

## 2015-09-30 ENCOUNTER — Ambulatory Visit: Payer: Medicare Other

## 2015-09-30 DIAGNOSIS — Z9181 History of falling: Secondary | ICD-10-CM

## 2015-09-30 DIAGNOSIS — I890 Lymphedema, not elsewhere classified: Secondary | ICD-10-CM | POA: Diagnosis not present

## 2015-09-30 DIAGNOSIS — Z9189 Other specified personal risk factors, not elsewhere classified: Secondary | ICD-10-CM | POA: Diagnosis not present

## 2015-09-30 DIAGNOSIS — R531 Weakness: Secondary | ICD-10-CM | POA: Diagnosis not present

## 2015-09-30 NOTE — Patient Instructions (Signed)
Cancer Rehab [phone] Start with 5 circles above collar bones.       Deep Effective Breath   Standing, sitting, or laying down place both hands on the belly. Take a deep breath IN, expanding the belly; then breath OUT, contracting the belly. Repeat __5__ times. Do __2-3__ sessions per day and before each self massage.  http://gt2.exer.us/866   Copyright  VHI. All rights reserved.  Inguinal Nodes to Axilla - Clear   On involved side, at armpit, make _5__ in-place circles. Then from hip proceed in sections to armpit with stationary circles or pumps _5_ times, this is your pathway. Do _1__ time per day.  Also do stationary circles at Lt groin at panty line and then create pathway with pumps below belly button from Rt hip to Lt. Copyright  VHI. All rights reserved.  LEG: Knee to Hip - Clear   Pump up outer thigh of involved leg from knee to outer hip. Then do stationary circles from inner to outer thigh, then do outer thigh again. Next, interlace fingers behind knee IF ABLE and make in-place circles. Do _5_ times of each sequence.  Do _1__ time per day.  Copyright  VHI. All rights reserved.  LEG: Ankle to Hip Sweep   Hands on sides of ankle of involved leg, pump _5__ times up both sides of lower leg, then retrace steps up outer thigh to hip as before and back to pathway. Do _2-3_ times. Do __1_ time per day.  Copyright  VHI. All rights reserved.  FOOT: Dorsum of Foot and Toes Massage   One hand on top of foot make _5_ stationary circles or pumps, then either on top of toes or each individual toe do _5_ pumps. Then retrace all steps pumping back up both sides of lower leg, outer thigh, and then pathway. Finish with what you started with, _5_ circles at involved side arm pit. All _2-3_ times at each sequence. Do _1__ time per day.  Copyright  VHI. All rights reserved.

## 2015-09-30 NOTE — Therapy (Addendum)
[place], [place], [address] Phone: [phone]   Fax:  [phone]  Physical Therapy Treatment  Patient Details  Name: Melissa Alvarez MRN: [id number] Date of Birth: 1976/02/17 Referring Provider: Dr. Denman George   Encounter Date: 09/30/2015      PT End of Session - 09/30/15 1158    Visit Number 2   Number of Visits 13   Date for PT Re-Evaluation 11/01/15   PT Start Time 1107   PT Stop Time 1152   PT Time Calculation (min) 45 min   Activity Tolerance Patient tolerated treatment well   Behavior During Therapy [facility] for tasks assessed/performed      Past Medical History  Diagnosis Date  . Asthma   . Cancer (HCC)     cervical  . Anemia   . History of blood transfusion     last one 2 years ago   . COPD (chronic obstructive pulmonary disease) ([place])   . Gastric ulcer     x 5  . Difficulty sleeping   . Family history of adverse reaction to anesthesia     pts daughter awaken during surgery   . Shortness of breath dyspnea     exertion   . Numbness and tingling     right hip area following June 2016 surgery pt states has improved   . Postoperative nausea and vomiting 01/21/2015    Past Surgical History  Procedure Laterality Date  . Appendectomy    . Cholecystectomy    . Tonsillectomy    . Cervical cone biopsy      x 6  . Cesarean section      x 2  . Shoulder surgery Left   . Cervical conization w/bx N/A 12/08/2014    Procedure: COLD KNIFE CONIZATION OF CERVIX;  Surgeon: [name], MD;  Location: [facility];  Service: Gynecology;  Laterality: N/A;  . Dilation and curettage of uterus    . Robotic assisted total hysterectomy N/A 01/19/2015    Procedure: ROBOTIC ASSISTED RADICAL HYSTERECTOMY RIGHT TUBE AND OVARY, LEFT TUBE;  Surgeon: [name], MD;  Location: [facility];  Service: Gynecology;  Laterality: N/A;  . Pelvic lymph node dissection N/A 01/19/2015    Procedure: PELVIC LYMPH NODE DISSECTION;   Surgeon: [name], MD;  Location: [facility];  Service: Gynecology;  Laterality: N/A;    There were no vitals filed for this visit.  Visit Diagnosis:  Lymphedema  At risk for injury related to fall      Subjective Assessment - 09/30/15 1111    Subjective My Rt leg is hurting today.    Pertinent History hysterectomy with right ovary removed in August 2016 with symptoms of pain in groin    Patient Stated Goals to get rid of pain and swelling    Currently in Pain? Yes   Pain Score 6    Pain Location Leg   Pain Orientation Right   Pain Descriptors / Indicators Aching   Pain Onset More than a month ago   Pain Frequency Constant   Aggravating Factors  wearing too tight pants    Pain Relieving Factors getting pressure off the groin                         [facility] Adult PT Treatment/Exercise - 09/30/15 0001    Manual Therapy   Manual Lymphatic Drainage (MLD) In Supine, instructed pt while performing today: Short neck, superficial and deep abdominals,  Rt axilla and Lt inguinal nodes, Rt inguino-axillary and anterior inter-inguinal anastomosis, and Rt LE from dorsal foot to lateral thigh.                PT Education - 09/30/15 1157    Education provided Yes   Education Details Instructed pt while performing in manual lymph drainage today and issued Celine Ahr DVD for further instruction.     Person(s) Educated Patient   Methods Explanation;Demonstration;Tactile cues;Handout   Comprehension Verbalized understanding;Need further instruction                [facility] Goals - 09/24/15 0747    CC Long Term Goal  #1   Title Patient with verbalize an understanding of lymphedema risk reduction precautions   Time 4   Period Weeks   Status New   CC Long Term Goal  #2   Title Patient will be independent in a exercise program   Time 4   Period Weeks   Status New   CC Long Term Goal  #3   Title Patient will be know how to obtain and use compression garments  for maintenance phase of treatment   Time 4   Period Weeks   Status New   CC Long Term Goal  #4   Title Patient will report a decrease in pain by 50% so they can perform daily activities with greater ease   Time 4   Period Weeks   Status New   CC Long Term Goal  #5   Title pt will have decrease in lymphedema life impact scale to 40% indicating an improvement in Lymphedema affect on her life   Time 4   Period Weeks   Status New   CC Long Term Goal  #6   Title Pt will increase repetitions of sit to stand to 10 in 30 seconds indicating and improvement in functional strength and decrease risk of fall    Time 4   Period Weeks   Status New   Additional Goals   Additional Goals Yes            Plan - 09/30/15 1158    Clinical Impression Statement Pt still awaiting results from her MRI, sees doctor Monday April 3 for this. Though having pain upon arrival today, she tolerated session very well and reported feeling like pain had decreased by end of session but could feel it coming back when she sat up and stood to leave. She seemed to do well with initial understanding of instruction of manual lymph driange and principles of lympatic system.    Pt will benefit from skilled therapeutic intervention in order to improve on the following deficits Decreased activity tolerance;Decreased knowledge of precautions;Impaired perceived functional ability;Pain;Decreased strength;Increased edema   Rehab Potential Good   Clinical Impairments Affecting Rehab Potential previous radiation and surgery affecting pelvic lymph nodes    PT Next Visit Plan continue assessment of abdominal scar mobility and measure widest part of abdomen; review and cont instructing in manual lymph drianage.    PT Home Exercise Plan see education section   Consulted and Agree with Plan of Care Patient        Problem List Patient Active Problem List   Diagnosis Date Noted  . Urinary retention with incomplete bladder emptying  02/22/2015  . Groin mass in female 02/22/2015  . Postoperative nausea and vomiting 01/21/2015  . Cervical cancer, FIGO stage IB1 ([place]) 01/19/2015  . Cervical cancer ([place])  Otelia Limes, PTA 09/30/2015, 12:02 PM  [place], [place], [address] Phone: [phone]   Fax:  [phone]  Name: Melissa Alvarez MRN: [id number] Date of Birth: Dec 06, 1975  PHYSICAL THERAPY DISCHARGE SUMMARY  Visits from Start of Care: 2  Current functional level related to goals / functional outcomes: As above    Remaining deficits: Unknown    Education / Equipment: As above  Plan: Patient agrees to discharge.  Patient goals were not met. Patient is being discharged due to not returning since the last visit.  ?????         Maudry Diego, PT 10/21/2015 2:12 PM

## 2015-10-04 ENCOUNTER — Encounter: Payer: Self-pay | Admitting: Gynecologic Oncology

## 2015-10-04 ENCOUNTER — Ambulatory Visit: Payer: Medicare Other | Attending: Gynecologic Oncology | Admitting: Gynecologic Oncology

## 2015-10-04 DIAGNOSIS — Z833 Family history of diabetes mellitus: Secondary | ICD-10-CM | POA: Diagnosis not present

## 2015-10-04 DIAGNOSIS — R200 Anesthesia of skin: Secondary | ICD-10-CM | POA: Insufficient documentation

## 2015-10-04 DIAGNOSIS — R0602 Shortness of breath: Secondary | ICD-10-CM | POA: Insufficient documentation

## 2015-10-04 DIAGNOSIS — R159 Full incontinence of feces: Secondary | ICD-10-CM | POA: Diagnosis not present

## 2015-10-04 DIAGNOSIS — Z809 Family history of malignant neoplasm, unspecified: Secondary | ICD-10-CM | POA: Diagnosis not present

## 2015-10-04 DIAGNOSIS — I890 Lymphedema, not elsewhere classified: Secondary | ICD-10-CM | POA: Diagnosis not present

## 2015-10-04 DIAGNOSIS — Z880 Allergy status to penicillin: Secondary | ICD-10-CM | POA: Insufficient documentation

## 2015-10-04 DIAGNOSIS — N319 Neuromuscular dysfunction of bladder, unspecified: Secondary | ICD-10-CM | POA: Insufficient documentation

## 2015-10-04 DIAGNOSIS — R339 Retention of urine, unspecified: Secondary | ICD-10-CM | POA: Diagnosis not present

## 2015-10-04 DIAGNOSIS — C539 Malignant neoplasm of cervix uteri, unspecified: Secondary | ICD-10-CM

## 2015-10-04 DIAGNOSIS — J45909 Unspecified asthma, uncomplicated: Secondary | ICD-10-CM | POA: Insufficient documentation

## 2015-10-04 DIAGNOSIS — R590 Localized enlarged lymph nodes: Secondary | ICD-10-CM | POA: Diagnosis not present

## 2015-10-04 DIAGNOSIS — G8929 Other chronic pain: Secondary | ICD-10-CM | POA: Diagnosis not present

## 2015-10-04 DIAGNOSIS — Z8541 Personal history of malignant neoplasm of cervix uteri: Secondary | ICD-10-CM | POA: Diagnosis not present

## 2015-10-04 DIAGNOSIS — Z90710 Acquired absence of both cervix and uterus: Secondary | ICD-10-CM | POA: Diagnosis not present

## 2015-10-04 DIAGNOSIS — D649 Anemia, unspecified: Secondary | ICD-10-CM | POA: Diagnosis not present

## 2015-10-04 DIAGNOSIS — Z8719 Personal history of other diseases of the digestive system: Secondary | ICD-10-CM | POA: Insufficient documentation

## 2015-10-04 DIAGNOSIS — Z8249 Family history of ischemic heart disease and other diseases of the circulatory system: Secondary | ICD-10-CM | POA: Insufficient documentation

## 2015-10-04 DIAGNOSIS — J449 Chronic obstructive pulmonary disease, unspecified: Secondary | ICD-10-CM | POA: Diagnosis not present

## 2015-10-04 NOTE — Patient Instructions (Signed)
We will make a referral to a pain specialist and you will be contacted with an appointment.  Plan to follow up with Dr. Denman George in July or sooner if needed.  Please call our office [phone] in one to two weeks to schedule your appointment.

## 2015-10-04 NOTE — Progress Notes (Signed)
POSTOPERATIVE FOLLOWUP Assessment:    40 y.o. year old with Stage IB1 cervical cancer.   S/p modified radical hysterectomy with bilateral pelvic lymphadenectomy on 01/19/15.  Postop bladder dysfunction/urinary retention: decreased sense of urinary urge, and fecal incontinence.  Plan: 1) stage IB1 cervical cancer. Recommend continued q 3 monthly surveillance visits until July 2018 (then 6 monthly) and annual vaginal cytology to screen for new HPV related disease. No evidence for recurrence at today's exam. 2) Urinary retention: counseled patient about regular voiding - every 3 hours, and positioning to aid in voiding. Discussed risks of permanent bladder dysfunction if she does not void regularly. 3)  Fecal incontinence: have refered patient to see Dr Maryland Pink for evaluation and possible pelvic floor muscle training. Recommended metamucil for stool bulking. 4) right inguinal adenopathy - benign on biopsy, and lymphedema - continue PT (appreciate their input) 5) chronic pain: Will refer to chronic pain clinic for chronic prescription opioid use (this predated her seeing me for her cervical cancer).  HPI:  Melissa Alvarez is a 40 y.o. year old G2P2000 initially seen in consultation on 11/24/14 for cervical cancer.  She then underwent a cold knife conization on [id number] without complications.  Her postoperative course was complicated by a trip to the ER for severe pain post cone. CT imaging showed no abscess or abnormality. Her white count was mildly elevated but she was afebrile. She was discharged from the ER and I prescribed a course of doxy and flagl for presumed cervicitis. She continues to have pain postop op and so I repeated her CT imaging on 12/23/14 and this showed slightly more pelvic fluid, but no abscess. She has multiple fibroids, one of which appears to be degenerating.   Her final pathology from her cone revealed invasive moderately differentiated SCC measuring 1.32mm in greatest  dimension with 1.52mm depth of invasion. There were multiple foci all less than 26mm. LVSI was present. 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 The post cone endocervical curretting was positive for fragments of squamous cell carcinoma.  On 01/19/15 she underwent robotic assisted type II modified radical hysterectomy, with RSO and bilateral pelvic lymphadenectomy. Pathology revealed CIS but no residual carcinoma and lymph nodes were negative. She did not meet criteria for needing adjuvant therapy.  Postop she experienced severe nausea and emesis and abdominal pain in the 1st week, and required readmission for supportive care, however she resolved symptoms within 24 hours. CT imaging was unremarkable.  She developed inguinal masses postoperatively (on the right). These were biopsied for benign findings (benign reactive lymph node with dermatopathologic changes) in August, 2016.  In December 2016 she reported anal leakage, and poor sensation of a full rectum. She needs to defecate 20 minutes after eating and notes her stools are looser than usual. She has had several "accidents". These symptoms have developed since surgery.  Postoperatively she developed fluctuating fullness in the right groin.The pain was very bothersome and limits quality of life and has caused multiple ED visits.   Interval Hx: She presents today for routine scheduled followup. She had an MRI of the pelvis on 09/14/15 which showed no etiology for her leg pain and no lymphadenopathy. She was sent to PT who diagnosed her with RLE lymphedema and are actively treating her for this.  Current Outpatient Prescriptions on File Prior to Visit  Medication Sig Dispense Refill  . albuterol (PROVENTIL HFA;VENTOLIN HFA) 108 (90 Base) MCG/ACT inhaler Inhale 2 puffs into the lungs once. 1 Inhaler 1  . oxyCODONE (OXY  IR/ROXICODONE) 5 MG immediate release tablet Take 1 tablet (5 mg total) by mouth every 4 (four) hours as needed for severe pain. 25 tablet 0    No current facility-administered medications on file prior to visit.    Allergies  Allergen Reactions  . Acetaminophen Hives    With codeine States she can take percocet   . Other Hives    Clinical steroids except for solu medrol  . Penicillins Hives, Itching and Rash  . Doxycycline Hives, Itching and Rash  . Erythromycin Hives, Itching and Rash  . Motrin [Ibuprofen] Hives, Itching and Rash    Past Medical History  Diagnosis Date  . Asthma   . Cancer (HCC)     cervical  . Anemia   . History of blood transfusion     last one 2 years ago   . COPD (chronic obstructive pulmonary disease) ([place])   . Gastric ulcer     x 5  . Difficulty sleeping   . Family history of adverse reaction to anesthesia     pts daughter awaken during surgery   . Shortness of breath dyspnea     exertion   . Numbness and tingling     right hip area following June 2016 surgery pt states has improved   . Postoperative nausea and vomiting 01/21/2015   Past Surgical History  Procedure Laterality Date  . Appendectomy    . Cholecystectomy    . Tonsillectomy    . Cervical cone biopsy      x 6  . Cesarean section      x 2  . Shoulder surgery Left   . Cervical conization w/bx N/A 12/08/2014    Procedure: COLD KNIFE CONIZATION OF CERVIX;  Surgeon: [name], MD;  Location: [facility];  Service: Gynecology;  Laterality: N/A;  . Dilation and curettage of uterus    . Robotic assisted total hysterectomy N/A 01/19/2015    Procedure: ROBOTIC ASSISTED RADICAL HYSTERECTOMY RIGHT TUBE AND OVARY, LEFT TUBE;  Surgeon: [name], MD;  Location: [facility];  Service: Gynecology;  Laterality: N/A;  . Pelvic lymph node dissection N/A 01/19/2015    Procedure: PELVIC LYMPH NODE DISSECTION;  Surgeon: [name], MD;  Location: [facility];  Service: Gynecology;  Laterality: N/A;   Family History  Problem Relation Age of Onset  . Cancer Mother   . Hypertension Mother   . Diabetes Mother   . CAD Mother   . Diabetes Other   .  Hypertension Other   . Cancer Other   . CAD Other    Social History   Social History  . Marital Status: Single    Spouse Name: N/A  . Number of Children: N/A  . Years of Education: N/A   Occupational History  . Not on file.   Social History Main Topics  . Smoking status: Current Every Day Smoker -- 2.00 packs/day for 24 years    Types: Cigarettes  . Smokeless tobacco: Never Used  . Alcohol Use: No     Comment: past hx of occas use   . Drug Use: Yes    Special: Marijuana     Comment: not currently  . Sexual Activity: No   Other Topics Concern  . Not on file   Social History Narrative     Review of systems: Constitutional:  She has no weight gain or weight loss. She has no fever or chills. Eyes: No blurred vision Ears, Nose, Mouth, Throat: No dizziness, headaches or changes in  hearing. No mouth sores. Cardiovascular: No chest pain, palpitations or edema. Respiratory:  No shortness of breath, wheezing or cough Gastrointestinal: She has normal bowel movements without diarrhea or constipation. She denies any nausea or vomiting. She denies blood in her stool or heart burn. + fecal incontinence Genitourinary:  + groin pain, no bleeding Musculoskeletal: Denies muscle weakness or joint pains.  Skin:  She has no skin changes, rashes or itching Neurological:  No headache Psychiatric:  She denies depression or anxiety. Hematologic/Lymphatic:   No easy bruising or bleeding   Physical Exam: Last menstrual period 12/08/2014. General: Well dressed, well nourished in no apparent distress.   HEENT:  Normocephalic and atraumatic, no lesions.  Extraocular muscles intact. Sclerae anicteric. Pupils equal, round, reactive. No mouth sores or  Abdomen:  Soft, nontender, nondistended.  No palpable masses.  No hepatosplenomegaly.  No ascites. Normal bowel sounds.  No hernias.  Well healed incisions. Genitourinary: Normal EGBUS  Vaginal cuff in tact with no abnormal drainage. Healing  normally, no masses or lesions. Rectal: grossly normal tone. In tact sphincter palpably. No masses. Extremities: Mild right LE edema compared to left Psychiatric: Mood and affect are appropriate. Neurological: Awake, alert and oriented x 3. Sensation is intact, no neuropathy.  Musculoskeletal: No pain, normal strength and range of motion. Lymph nodes: right inguinal region has no palpable tender nodes.  No cervical adenopathy.  [name], MD

## 2015-10-05 ENCOUNTER — Ambulatory Visit: Payer: Medicare Other | Attending: Gynecologic Oncology | Admitting: Physical Therapy

## 2015-10-05 DIAGNOSIS — M6281 Muscle weakness (generalized): Secondary | ICD-10-CM | POA: Insufficient documentation

## 2015-10-05 DIAGNOSIS — I890 Lymphedema, not elsewhere classified: Secondary | ICD-10-CM | POA: Insufficient documentation

## 2015-10-07 ENCOUNTER — Telehealth: Payer: Self-pay

## 2015-10-07 NOTE — Telephone Encounter (Signed)
Orders received from [facility] , APNP to follow up on the referral that was placed to the  the [facility]: [phone] . Writer spoke to [employer] and today , Inez Catalina states the physician has not reviewed the referral yet and they will contact our office as soon as the case is either approved or denied.

## 2015-10-08 ENCOUNTER — Other Ambulatory Visit: Payer: Self-pay | Admitting: Gynecologic Oncology

## 2015-10-08 DIAGNOSIS — R1909 Other intra-abdominal and pelvic swelling, mass and lump: Secondary | ICD-10-CM

## 2015-10-08 DIAGNOSIS — R1032 Left lower quadrant pain: Secondary | ICD-10-CM

## 2015-10-08 MED ORDER — OXYCODONE HCL 5 MG PO TABS: 5.0000 mg | ORAL_TABLET | ORAL | Status: DC | PRN

## 2015-10-08 NOTE — Progress Notes (Signed)
Patient called reporting moderate right leg discomfort.  Asking for a refill since she has not heard anything from the pain clinic referral.  Per Dr. Denman George, give patient a refill for #50 tablets that would have to last her the month of April or until she is seen by pain clinic.

## 2015-10-12 ENCOUNTER — Encounter: Payer: Medicare Other | Admitting: Physical Therapy

## 2015-10-12 ENCOUNTER — Telehealth: Payer: Self-pay | Admitting: Physical Therapy

## 2015-10-12 NOTE — Telephone Encounter (Addendum)
Placed call to patient as she missed her therapy appointment again today.  She has missed 4 out of the 5 scheduled appointments.  If she does not return call will discharge her from out services.  Maudry Diego, PT 4/11/ 2017 @ 11:36 AM

## 2015-10-14 ENCOUNTER — Telehealth: Payer: Self-pay | Admitting: Gynecologic Oncology

## 2015-10-14 ENCOUNTER — Ambulatory Visit: Payer: Medicare Other | Admitting: Physical Therapy

## 2015-10-14 NOTE — Therapy (Signed)
[place], [place], [address] Phone: [phone]   Fax:  [phone]  Patient Details  Name: Melissa Alvarez MRN: [id number] Date of Birth: 11/12/1975 Referring Provider:  Dorothyann Gibbs, NP  Encounter Date: 10/14/2015  Pt did not show up for PT again today and did not return our call as requested at phone call when she missed  last visit.  As I was preparing to close out her chart to discharge the episode, secretary reached pt on the phone.  She said she had not been able to come because her daughter was in ICU but she wanted to come to visit scheduled for next week. Episode was continued  Norwood Levo 10/14/2015, 1:26 PM  [place], [place], [address] Phone: [phone]   Fax:  [phone]

## 2015-10-14 NOTE — Telephone Encounter (Signed)
Spoke with Inez Catalina, new pt coordinator at [facility], about new patient referral.  She states that they have accepted her as a new patient but they have not been able to get in touch with the patient.  Another number given to their office: [phone].  Called all the numbers available for the patient and left messages that the Pain Clinic has been trying to contact her to make an appt.  Received a return call from Inez Catalina stating she had spoken with the patient and she has an appt on May 15.

## 2015-10-19 ENCOUNTER — Encounter: Payer: Medicare Other | Admitting: Physical Therapy

## 2015-10-19 ENCOUNTER — Telehealth: Payer: Self-pay | Admitting: Physical Therapy

## 2015-10-19 NOTE — Telephone Encounter (Signed)
Pt did not show up for appointment today even though she confirmed last week she would be here.  Called phone number from [employer] to inquire about patient.  There was no answer and phone message box was full, so could not leave a message.   Will ask secretary to continue to call to let pt know that we will discharge her from our service at the time since she has not shown up or called to cancel  for several appoinments.  Donato Heinz. Owens Shark, PT

## 2015-10-21 ENCOUNTER — Ambulatory Visit: Payer: Medicare Other | Admitting: Physical Therapy

## 2015-10-26 ENCOUNTER — Encounter: Payer: Medicare Other | Admitting: Physical Therapy

## 2015-10-28 ENCOUNTER — Telehealth: Payer: Self-pay | Admitting: Gynecologic Oncology

## 2015-10-28 ENCOUNTER — Encounter: Payer: Medicare Other | Admitting: Physical Therapy

## 2015-10-28 DIAGNOSIS — M79606 Pain in leg, unspecified: Secondary | ICD-10-CM

## 2015-10-28 NOTE — Progress Notes (Signed)
11:40 AM patient stopped by the clinic to update Dr [name] that her Pain Management appointment on May 15 , 2017 was canceled due to an "auto" claim that needs to be resolved and than Medicare will resubmit her claim to for Pain Management consultation. [id number] [phone] from Pain Management and she sates she will contact us to update with the when the patient will be seen . Patient was informed that we will update Dr Denman George of what had happened and get back to her with Dr Denman George recommendations for pain management . Patient is aware that Dr Denman George may not consent to write for narcotics as this was discussed previously , patient states being aware.

## 2015-10-28 NOTE — Telephone Encounter (Signed)
Melissa Alvarez, referrals coordinator for [facility], about why the patient's appointment was cancelled.  She stated that the patient has Medicare Part A and B with Medicaid as secondary.  She states she ran her Medicare card and she has two auto claims one from 2010 and one from 2012 with liability insurance as primary on those and Medicare as second.  She would have to take care of the claims before having a new referral.  She stated their office only takes clean Medicare accounts.  Stating if she was able to get in to an office, then they may bill and get reimbursed then Medicare could come and ask for the money back.  Spoke with a representative with [facility] to see if the patient could be seen there.  Records faxed to the new patient referral's coordinator at [phone].  The office number is [phone] and fax [phone].

## 2015-10-28 NOTE — Telephone Encounter (Signed)
Discussed case with risk management as patient threatening legal action regarding our unwillingness to continue to prescribe narcotics. Melissa Alvarez does not have an identifiable medical source for her chronic pain, which pre-existed her radical hysterectomy.   I will not be able to continue to prescribe narcotics for Melissa Alvarez, however I have made a referral to [facility]. The appointment is held up because Melissa Alvarez is required to resolve paperwork issues related to medicare payments in order for that appointment to take place, however, once resolved, this clinic has accepted her.  [name], MD

## 2015-10-28 NOTE — Progress Notes (Signed)
Dr [name] updated and the patient request for narcotics was denied , patient needs to follow through with her auto claim. The patient was contacted and updated , the patient became angry and stated she will be here tomorrow to see Dr Terrence Dupont Rossi's boss . Patient states will be seeking an"attorney ,because Dr Denman George "did this to me " and "no one will touch" me due to her" . Writer informed the patient that Dr Denman George will be updated with her statement and the patient hung up the phone during our conversation.

## 2015-10-30 IMAGING — CT CT ABD-PELV W/ CM
2 of 4 series · 15 of 46 positions shown, 17 images · IV contrast (OMNIPAQUE 300)
Comparison: CT of the abdomen and pelvis performed 06/25/2014, and
MRI of the pelvis performed 12/03/2014. Abdominal ultrasound
performed 11/19/2014

CLINICAL DATA: Acute onset of persistent lower abdominal discomfort
and tightness, since cold knife conization. Initial encounter.

EXAM:
CT ABDOMEN AND PELVIS WITH CONTRAST
TECHNIQUE: Multidetector CT imaging of the abdomen and pelvis was performed
using the standard protocol following bolus administration of
intravenous contrast.
CONTRAST:  100mL OMNIPAQUE IOHEXOL 300 MG/ML  SOLN

[Series 2: abd/pel with · axial · 0.74mm/px · z∈[-458,-18]mm · 12 of 98 slices shown, 14 images]
[im 5/98  soft-tissue]
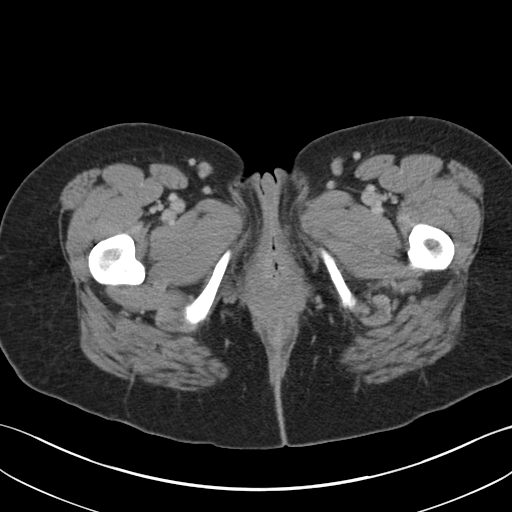
[im 5/98  bone]
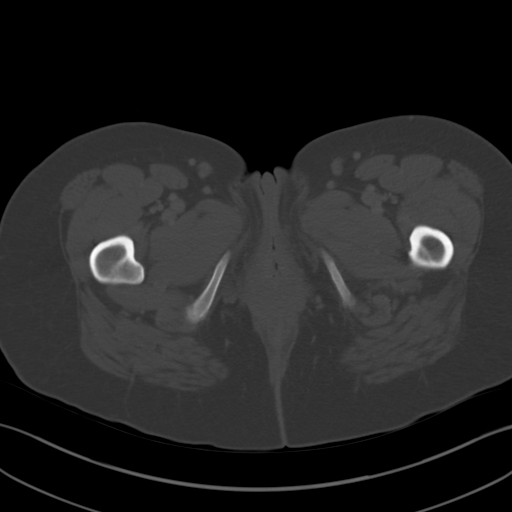
[im 13/98  soft-tissue]
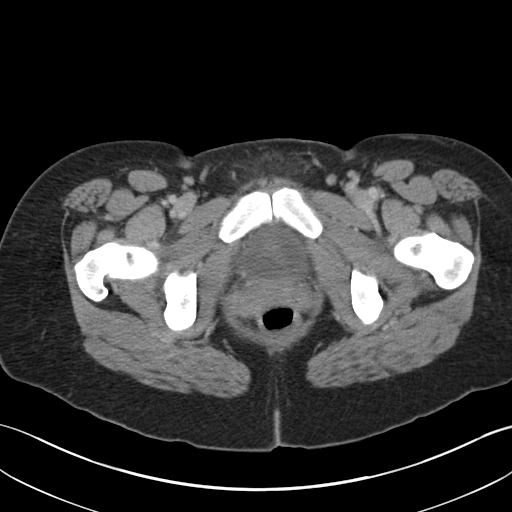
[im 21/98  soft-tissue]
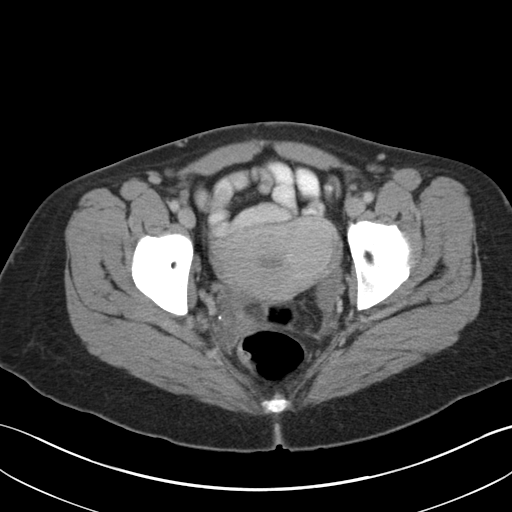
[im 29/98  soft-tissue]
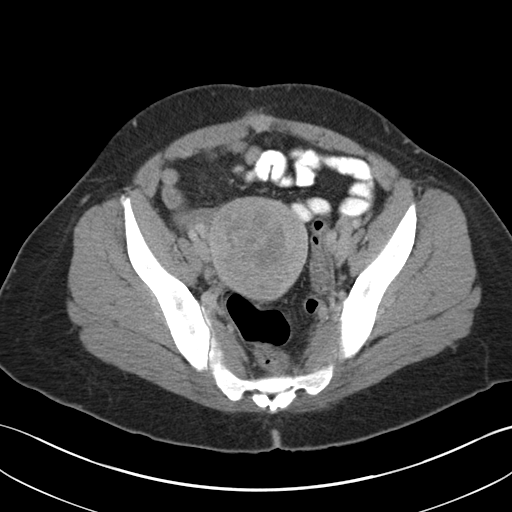
[im 37/98  soft-tissue]
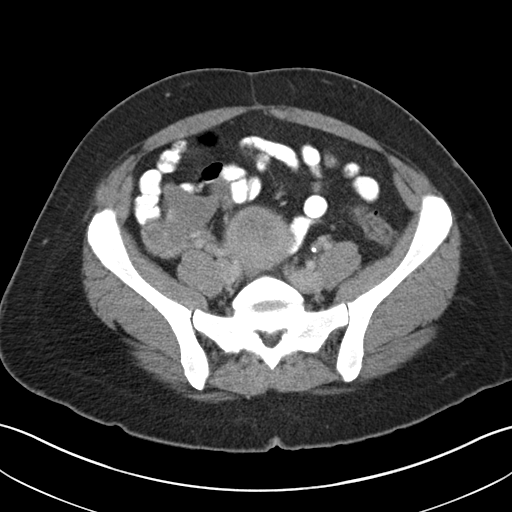
[im 45/98  soft-tissue]
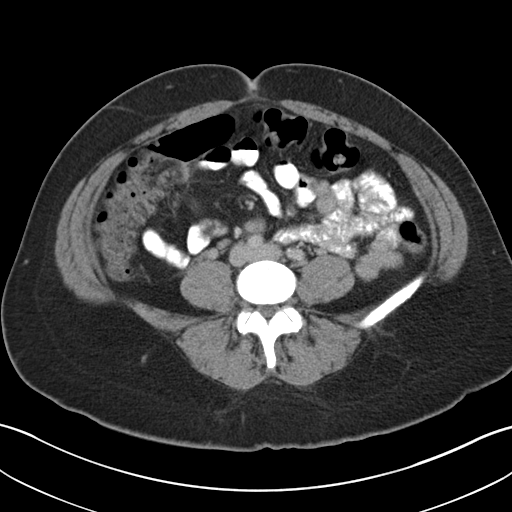
[im 53/98  soft-tissue]
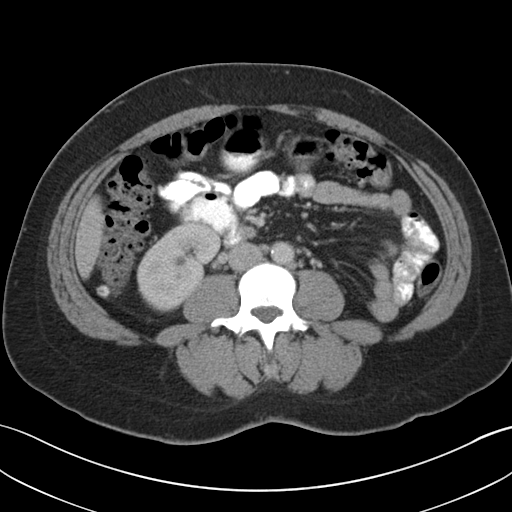
[im 61/98  soft-tissue]
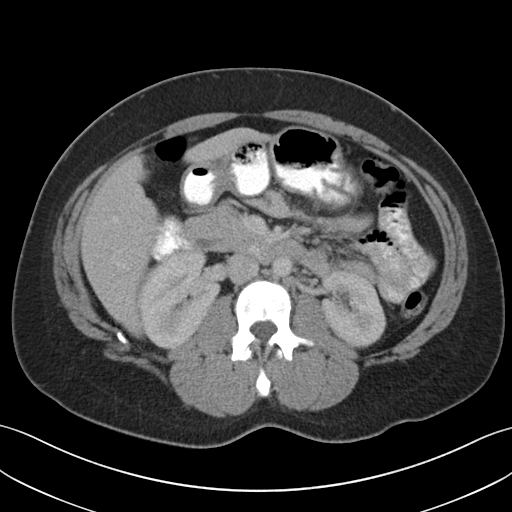
[im 69/98  soft-tissue]
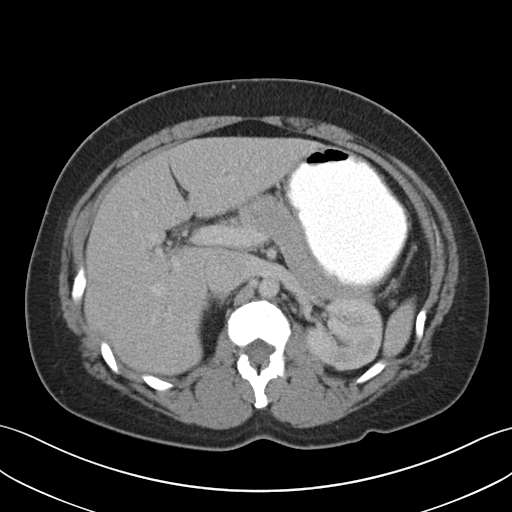
[im 69/98  bone]
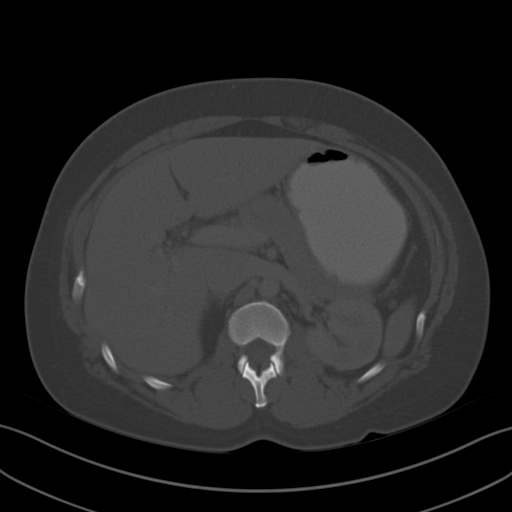
[im 77/98  soft-tissue]
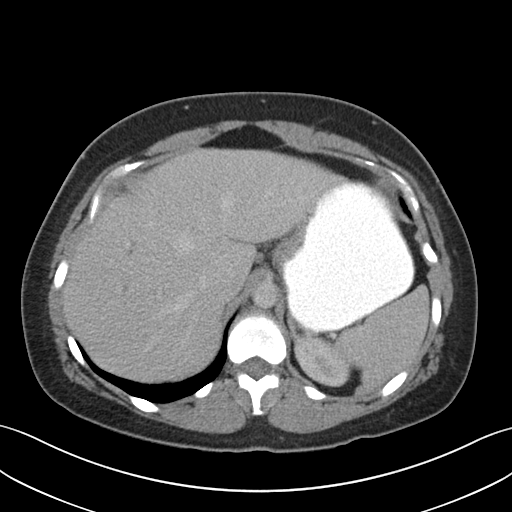
[im 85/98  soft-tissue]
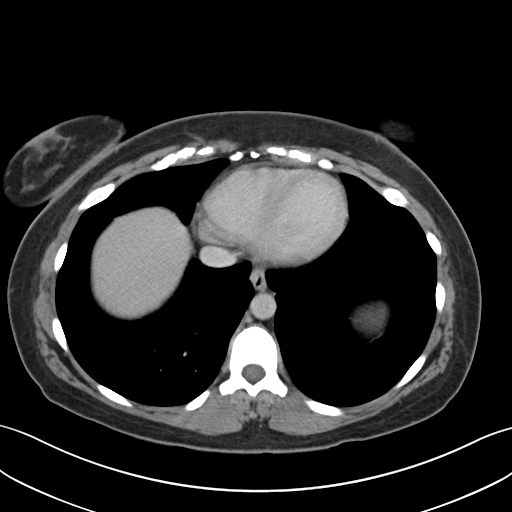
[im 93/98  soft-tissue]
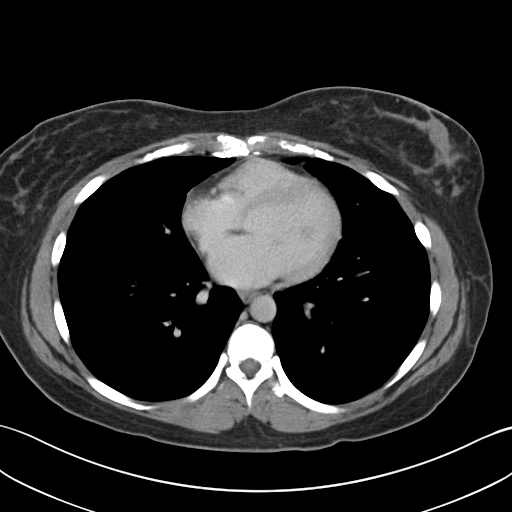

[Series 5: coronal a/|p · coronal · 0.74mm/px · 3 of 98 slices shown]
[im 33/98  soft-tissue]
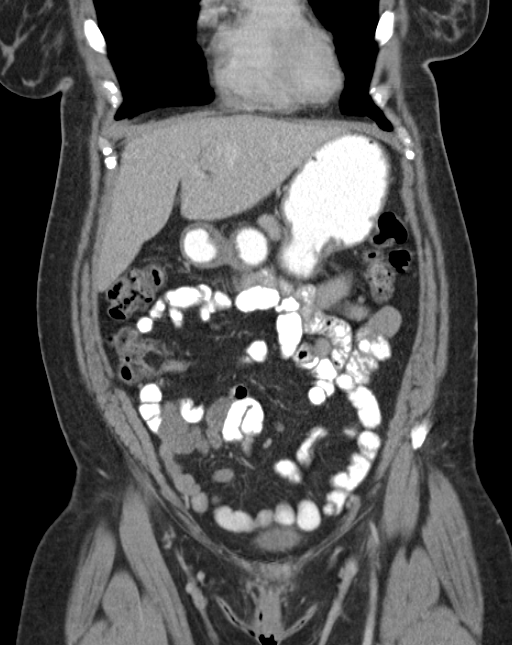
[im 44/98  soft-tissue]
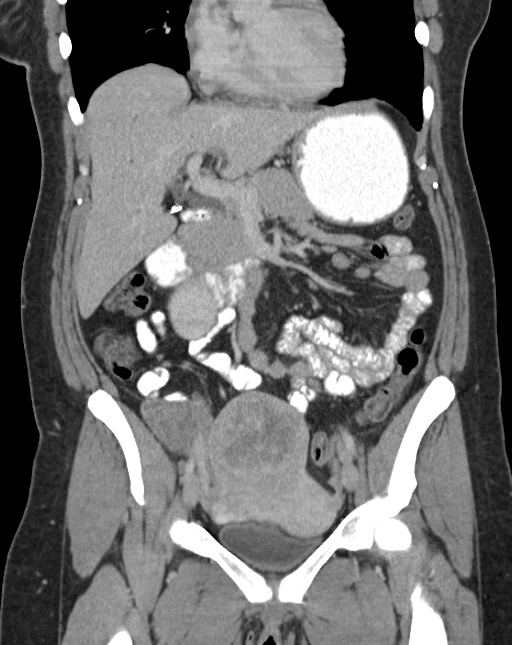
[im 54/98  soft-tissue]
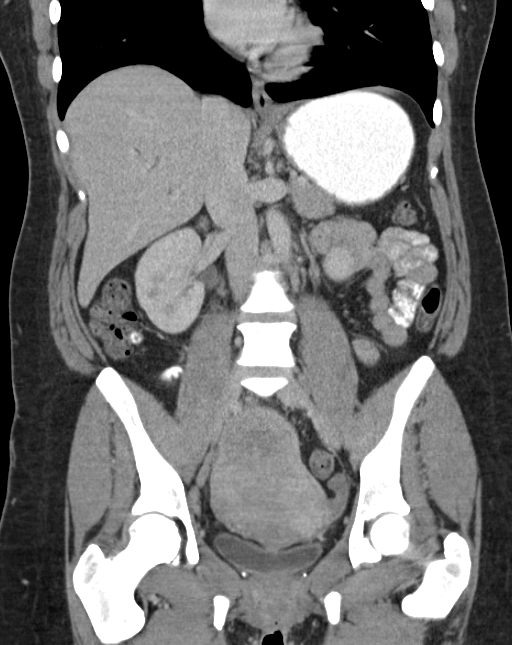

[15 of 46 positions shown; findings below may reference images not displayed]

FINDINGS: The visualized lung bases are clear.

The liver and spleen are unremarkable in appearance. The patient is
status post cholecystectomy, with clips noted at the gallbladder
fossa. The pancreas and adrenal glands are unremarkable.

The kidneys are unremarkable in appearance. There is no evidence of
hydronephrosis. No renal or ureteral stones are seen. No perinephric
stranding is appreciated.

No free fluid is identified. The small bowel is unremarkable in
appearance. The stomach is within normal limits. No acute vascular
abnormalities are seen.

The patient is status post appendectomy. Mild diverticulosis is
noted along the ascending, distal transverse and proximal descending
colon, without evidence of diverticulitis.

The bladder is mildly distended and grossly unremarkable in
appearance. Air about the cervix may reflect recent surgery.
Scattered fibroids are seen within the uterus. Mild edema is noted
about the right side of the cervix, tracking into the lower pelvis,
possibly postoperative in nature.

A right ovarian mass has increased in size, measuring 6.2 x 6.1 x
5.1 cm. This demonstrated diffuse relatively homogeneous enhancement
on the prior MRI. Given gradual increase in size, malignancy is a
concern. No inguinal lymphadenopathy is seen.

No acute osseous abnormalities are identified.
IMPRESSION: 1. Right ovarian mass has increased in size, measuring 6.2 x 6.1 x
5.1 cm. This demonstrated diffuse relatively homogeneous enhancement
on the prior MRI. Given gradual increase in size, malignancy is a
concern. Would recommend biopsy for further evaluation, as deemed
clinically appropriate.
2. Mild edema noted about the right side of the cervix, tracking
into the lower pelvis; this may be postoperative in nature.
Scattered uterine fibroids again seen. No evidence of abscess at
this time.
3. Mild diverticulosis along the ascending, distal transverse and
proximal descending colon, without evidence of diverticulitis.

These results were called by telephone at the time of interpretation
on 12/16/2014 at [DATE] to PAULUS TONATA CELLIERS PA, who verbally acknowledged
these results.

## 2015-11-02 NOTE — Telephone Encounter (Signed)
[facility]: [phone] contacted to follow up on referral placed on April 27 , 2017 . Spoke with Merrily Pew and he states that the referral has been received ,but needs to be reviewed . Merrily Pew states that it may take 24-48 hours before we will receive an answer , Josh also states that if we additional question to call ,[facility], APNP updated .

## 2015-11-10 ENCOUNTER — Telehealth: Payer: Self-pay

## 2015-11-10 DIAGNOSIS — G894 Chronic pain syndrome: Secondary | ICD-10-CM | POA: Diagnosis not present

## 2015-11-10 DIAGNOSIS — M79605 Pain in left leg: Secondary | ICD-10-CM | POA: Diagnosis not present

## 2015-11-10 DIAGNOSIS — M79604 Pain in right leg: Secondary | ICD-10-CM | POA: Diagnosis not present

## 2015-11-10 DIAGNOSIS — M545 Low back pain: Secondary | ICD-10-CM | POA: Diagnosis not present

## 2015-11-10 DIAGNOSIS — G603 Idiopathic progressive neuropathy: Secondary | ICD-10-CM | POA: Diagnosis not present

## 2015-11-10 DIAGNOSIS — M79672 Pain in left foot: Secondary | ICD-10-CM | POA: Diagnosis not present

## 2015-11-10 DIAGNOSIS — Z79891 Long term (current) use of opiate analgesic: Secondary | ICD-10-CM | POA: Diagnosis not present

## 2015-11-10 DIAGNOSIS — G541 Lumbosacral plexus disorders: Secondary | ICD-10-CM | POA: Diagnosis not present
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
## 2015-11-15 NOTE — Telephone Encounter (Signed)
Error

## 2016-01-31 DIAGNOSIS — F209 Schizophrenia, unspecified: Secondary | ICD-10-CM | POA: Diagnosis not present

## 2016-02-01 DIAGNOSIS — F209 Schizophrenia, unspecified: Secondary | ICD-10-CM | POA: Diagnosis not present

## 2016-03-13 ENCOUNTER — Emergency Department (HOSPITAL_COMMUNITY)
Admission: EM | Admit: 2016-03-13 | Discharge: 2016-03-13 | Disposition: A | Discharge status: Home / Self Care | Payer: Medicare Other | Attending: Dermatology | Admitting: Dermatology

## 2016-03-13 ENCOUNTER — Emergency Department (HOSPITAL_COMMUNITY): Payer: Medicare Other

## 2016-03-13 ENCOUNTER — Encounter (HOSPITAL_COMMUNITY): Payer: Self-pay | Admitting: *Deleted

## 2016-03-13 DIAGNOSIS — J45909 Unspecified asthma, uncomplicated: Secondary | ICD-10-CM | POA: Insufficient documentation

## 2016-03-13 DIAGNOSIS — R0602 Shortness of breath: Secondary | ICD-10-CM | POA: Insufficient documentation

## 2016-03-13 DIAGNOSIS — J449 Chronic obstructive pulmonary disease, unspecified: Secondary | ICD-10-CM | POA: Diagnosis not present

## 2016-03-13 DIAGNOSIS — F17210 Nicotine dependence, cigarettes, uncomplicated: Secondary | ICD-10-CM | POA: Diagnosis not present

## 2016-03-13 DIAGNOSIS — Z5321 Procedure and treatment not carried out due to patient leaving prior to being seen by health care provider: Secondary | ICD-10-CM | POA: Insufficient documentation

## 2016-03-13 DIAGNOSIS — R079 Chest pain, unspecified: Secondary | ICD-10-CM | POA: Diagnosis not present

## 2016-03-13 DIAGNOSIS — F1290 Cannabis use, unspecified, uncomplicated: Secondary | ICD-10-CM | POA: Diagnosis not present

## 2016-03-13 DIAGNOSIS — R05 Cough: Secondary | ICD-10-CM | POA: Diagnosis not present

## 2016-03-13 MED ORDER — ALBUTEROL SULFATE (2.5 MG/3ML) 0.083% IN NEBU
5.0000 mg | INHALATION_SOLUTION | Freq: Once | RESPIRATORY_TRACT | Status: AC
  Administered 2016-03-13: 5 mg via RESPIRATORY_TRACT
  Filled 2016-03-13: qty 6

## 2016-03-13 NOTE — ED Triage Notes (Signed)
Pt complains of cough, shortness of breath for 1 week. Pt has hx of asthma, states rescue inhaler is not providing relief.

## 2016-03-13 NOTE — ED Notes (Signed)
Pt wanted to leave AMA, states that she has a breathing treatment and an inhaler at home and if she doesn't feel any better will come back in the morning

## 2016-03-22 ENCOUNTER — Encounter: Payer: Self-pay | Admitting: Gynecologic Oncology

## 2016-03-22 ENCOUNTER — Ambulatory Visit: Payer: Medicare Other | Attending: Gynecologic Oncology | Admitting: Gynecologic Oncology

## 2016-03-22 VITALS — BP 148/89 | HR 85 | Temp 98.6°F | Resp 18 | Ht 65.0 in | Wt 200.0 lb

## 2016-03-22 DIAGNOSIS — I890 Lymphedema, not elsewhere classified: Secondary | ICD-10-CM

## 2016-03-22 DIAGNOSIS — G8929 Other chronic pain: Secondary | ICD-10-CM

## 2016-03-22 DIAGNOSIS — R590 Localized enlarged lymph nodes: Secondary | ICD-10-CM

## 2016-03-22 DIAGNOSIS — Z90710 Acquired absence of both cervix and uterus: Secondary | ICD-10-CM | POA: Diagnosis not present

## 2016-03-22 DIAGNOSIS — J449 Chronic obstructive pulmonary disease, unspecified: Secondary | ICD-10-CM | POA: Diagnosis not present

## 2016-03-22 DIAGNOSIS — Z8719 Personal history of other diseases of the digestive system: Secondary | ICD-10-CM | POA: Insufficient documentation

## 2016-03-22 DIAGNOSIS — C539 Malignant neoplasm of cervix uteri, unspecified: Secondary | ICD-10-CM | POA: Insufficient documentation

## 2016-03-22 DIAGNOSIS — Z8541 Personal history of malignant neoplasm of cervix uteri: Secondary | ICD-10-CM

## 2016-03-22 DIAGNOSIS — R339 Retention of urine, unspecified: Secondary | ICD-10-CM

## 2016-03-22 DIAGNOSIS — F17210 Nicotine dependence, cigarettes, uncomplicated: Secondary | ICD-10-CM | POA: Insufficient documentation

## 2016-03-22 DIAGNOSIS — R159 Full incontinence of feces: Secondary | ICD-10-CM

## 2016-03-22 DIAGNOSIS — D649 Anemia, unspecified: Secondary | ICD-10-CM | POA: Diagnosis not present

## 2016-03-22 MED ORDER — MORPHINE SULFATE 15 MG PO TABS: 15.0000 mg | ORAL_TABLET | ORAL | 0 refills | Status: DC | PRN
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
## 2016-03-22 NOTE — Patient Instructions (Signed)
You will receive a phone call from the hospital to arrange for you to have an ultrasound guided biopsy.  We will also place a referral for you to meet with Dr. Rosendo Gros at [facility] to rule out a hernia in the right groin.

## 2016-03-23 ENCOUNTER — Other Ambulatory Visit: Payer: Self-pay | Admitting: Gynecologic Oncology

## 2016-03-23 ENCOUNTER — Telehealth: Payer: Self-pay

## 2016-03-23 DIAGNOSIS — R1031 Right lower quadrant pain: Secondary | ICD-10-CM

## 2016-03-23 NOTE — Telephone Encounter (Signed)
Orders received to contact [facility] [phone] with Dr Rosendo Gros for evaluation for possible hernia in the right groin. Spoke with Kennyth Lose who transferred me to the intake nurses line . No answer , left a detailed message with request for referral. Our contact information was provided.

## 2016-03-24 ENCOUNTER — Other Ambulatory Visit: Payer: Self-pay | Admitting: Gynecologic Oncology

## 2016-03-24 DIAGNOSIS — R1909 Other intra-abdominal and pelvic swelling, mass and lump: Secondary | ICD-10-CM

## 2016-03-24 NOTE — Telephone Encounter (Signed)
Contacted the patient again to update that the referral was placed to see Dr Rosendo Gros at [facility]: [phone] . Spoke with Melissa Alvarez . Consultation was scheduled for October 17 , 2017 at 9:20 AM , patient is to arrive at 8:50 AM  . Patient states understanding ,denies any questions at this time.

## 2016-03-27 ENCOUNTER — Other Ambulatory Visit: Payer: Self-pay | Admitting: Gynecologic Oncology

## 2016-03-27 DIAGNOSIS — R1909 Other intra-abdominal and pelvic swelling, mass and lump: Secondary | ICD-10-CM

## 2016-03-28 ENCOUNTER — Ambulatory Visit (HOSPITAL_COMMUNITY)
Admission: RE | Admit: 2016-03-28 | Discharge: 2016-03-28 | Disposition: A | Discharge status: Home / Self Care | Payer: Medicare Other | Source: Ambulatory Visit | Attending: Gynecologic Oncology | Admitting: Gynecologic Oncology

## 2016-03-28 DIAGNOSIS — R1909 Other intra-abdominal and pelvic swelling, mass and lump: Secondary | ICD-10-CM | POA: Insufficient documentation

## 2016-03-28 DIAGNOSIS — R590 Localized enlarged lymph nodes: Secondary | ICD-10-CM | POA: Diagnosis not present

## 2016-03-29 ENCOUNTER — Telehealth: Payer: Self-pay

## 2016-03-29 NOTE — Telephone Encounter (Signed)
Orders received from [facility] to contact the patient with US Pelvis Limited results : "multiple right inguinal nodes .The inguinal lymph nodes on the right  are larger compared to her MRI on 09/14/2015. Also that the radiology department will be contacting her with a appointment for biopsy of the inguinal lymph nodes. Contacted the patient , patient's daughter answered the phone and states her morther was aware already and had an appointment for the biopsy on 04/05/2016 at 1 PM. No further questions or concerns were asked .

## 2016-04-03 ENCOUNTER — Other Ambulatory Visit: Payer: Self-pay | Admitting: Radiology

## 2016-04-04 ENCOUNTER — Other Ambulatory Visit: Payer: Self-pay | Admitting: Radiology

## 2016-04-05 ENCOUNTER — Ambulatory Visit (HOSPITAL_COMMUNITY)
Admission: RE | Admit: 2016-04-05 | Discharge: 2016-04-05 | Disposition: A | Discharge status: Home / Self Care | Payer: Medicare Other | Source: Ambulatory Visit | Attending: Gynecologic Oncology | Admitting: Gynecologic Oncology

## 2016-04-05 ENCOUNTER — Encounter (HOSPITAL_COMMUNITY): Payer: Self-pay

## 2016-04-05 DIAGNOSIS — Z888 Allergy status to other drugs, medicaments and biological substances status: Secondary | ICD-10-CM | POA: Insufficient documentation

## 2016-04-05 DIAGNOSIS — F17210 Nicotine dependence, cigarettes, uncomplicated: Secondary | ICD-10-CM | POA: Insufficient documentation

## 2016-04-05 DIAGNOSIS — Z90710 Acquired absence of both cervix and uterus: Secondary | ICD-10-CM | POA: Diagnosis not present

## 2016-04-05 DIAGNOSIS — I890 Lymphedema, not elsewhere classified: Secondary | ICD-10-CM | POA: Diagnosis not present

## 2016-04-05 DIAGNOSIS — C539 Malignant neoplasm of cervix uteri, unspecified: Secondary | ICD-10-CM

## 2016-04-05 DIAGNOSIS — R590 Localized enlarged lymph nodes: Secondary | ICD-10-CM | POA: Insufficient documentation

## 2016-04-05 DIAGNOSIS — Z8541 Personal history of malignant neoplasm of cervix uteri: Secondary | ICD-10-CM | POA: Diagnosis not present

## 2016-04-05 DIAGNOSIS — Z8719 Personal history of other diseases of the digestive system: Secondary | ICD-10-CM | POA: Insufficient documentation

## 2016-04-05 DIAGNOSIS — Z880 Allergy status to penicillin: Secondary | ICD-10-CM | POA: Insufficient documentation

## 2016-04-05 DIAGNOSIS — Z9049 Acquired absence of other specified parts of digestive tract: Secondary | ICD-10-CM | POA: Insufficient documentation

## 2016-04-05 DIAGNOSIS — J449 Chronic obstructive pulmonary disease, unspecified: Secondary | ICD-10-CM | POA: Diagnosis not present

## 2016-04-05 DIAGNOSIS — Z8249 Family history of ischemic heart disease and other diseases of the circulatory system: Secondary | ICD-10-CM | POA: Diagnosis not present

## 2016-04-05 LAB — CBC
HEMATOCRIT: 42.8 % (ref 36.0–46.0)
Hemoglobin: 13.9 g/dL (ref 12.0–15.0)
MCH: 30.5 pg (ref 26.0–34.0)
MCHC: 32.5 g/dL (ref 30.0–36.0)
MCV: 94.1 fL (ref 78.0–100.0)
Platelets: 220 10*3/uL (ref 150–400)
RBC: 4.55 MIL/uL (ref 3.87–5.11)
RDW: 13.4 % (ref 11.5–15.5)
WBC: 10.2 10*3/uL (ref 4.0–10.5)

## 2016-04-05 LAB — APTT: APTT: 32 s (ref 24–36)

## 2016-04-05 LAB — PROTIME-INR
INR: 1.02
Prothrombin Time: 13.4 seconds (ref 11.4–15.2)

## 2016-04-05 MED ORDER — LIDOCAINE-EPINEPHRINE 1 %-1:100000 IJ SOLN
INTRAMUSCULAR | Status: AC
  Filled 2016-04-05: qty 1

## 2016-04-05 MED ORDER — MIDAZOLAM HCL 2 MG/2ML IJ SOLN
INTRAMUSCULAR | Status: AC
  Filled 2016-04-05: qty 6

## 2016-04-05 MED ORDER — MIDAZOLAM HCL 2 MG/2ML IJ SOLN
INTRAMUSCULAR | Status: DC | PRN
  Administered 2016-04-05: 0.5 mg via INTRAVENOUS
  Administered 2016-04-05 (×2): 1 mg via INTRAVENOUS
  Administered 2016-04-05: 0.5 mg via INTRAVENOUS
  Administered 2016-04-05 (×2): 1 mg via INTRAVENOUS

## 2016-04-05 MED ORDER — FENTANYL CITRATE (PF) 100 MCG/2ML IJ SOLN
INTRAMUSCULAR | Status: AC
  Filled 2016-04-05: qty 6

## 2016-04-05 MED ORDER — FENTANYL CITRATE (PF) 100 MCG/2ML IJ SOLN
INTRAMUSCULAR | Status: DC | PRN
  Administered 2016-04-05: 50 ug via INTRAVENOUS
  Administered 2016-04-05: 25 ug via INTRAVENOUS
  Administered 2016-04-05 (×2): 50 ug via INTRAVENOUS
  Administered 2016-04-05: 25 ug via INTRAVENOUS
  Administered 2016-04-05: 50 ug via INTRAVENOUS

## 2016-04-05 MED ORDER — SODIUM CHLORIDE 0.9 % IV SOLN: INTRAVENOUS | Status: DC

## 2016-04-05 NOTE — Discharge Instructions (Signed)
Needle Biopsy, Care After °These instructions give you information about caring for yourself after your procedure. Your doctor may also give you more specific instructions. Call your doctor if you have any problems or questions after your procedure. °HOME CARE °· Rest as told by your doctor. °· Take medicines only as told by your doctor. °· There are many different ways to close and cover the biopsy site, including stitches (sutures), skin glue, and adhesive strips. Follow instructions from your doctor about: °¨ How to take care of your biopsy site. °¨ When and how you should change your bandage (dressing). °¨ When you should remove your dressing. °¨ Removing whatever was used to close your biopsy site. °· Check your biopsy site every day for signs of infection. Watch for: °¨ Redness, swelling, or pain. °¨ Fluid, blood, or pus. °GET HELP IF: °· You have a fever. °· You have redness, swelling, or pain at the biopsy site, and it lasts longer than a few days. °· You have fluid, blood, or pus coming from the biopsy site. °· You feel sick to your stomach (nauseous). °· You throw up (vomit). °GET HELP RIGHT AWAY IF: °· You are short of breath. °· You have trouble breathing. °· Your chest hurts. °· You feel dizzy or you pass out (faint). °· You have bleeding that does not stop with pressure or a bandage. °· You cough up blood. °· Your belly (abdomen) hurts. °  °This information is not intended to replace advice given to you by your health care provider. Make sure you discuss any questions you have with your health care provider. °  °Document Released: 06/01/2008 Document Revised: 11/03/2014 Document Reviewed: 06/15/2014 °Elsevier Interactive Patient Education ©2016 [employer] ° °

## 2016-04-05 NOTE — Sedation Documentation (Signed)
Attempted to call report to Guadelupe Sabin, RN states there are no nurses available at this time.

## 2016-04-05 NOTE — Procedures (Signed)
Technically successful US guided biopsy of right inguinal LN.   EBL: Minimal No immediate complications.   Ronny Bacon, MD Pager #: [phone]

## 2016-04-05 NOTE — Sedation Documentation (Signed)
Pt is stable. Pt is traveling to Radiology RN station. Attempted to call report to SS, Ivin Booty states that Lattie Haw will call this RN back in 5 minutes.

## 2016-04-05 NOTE — H&P (Signed)
Chief Complaint: right inguinal lymphadenopathy   Referring Physician:Dr. [name]  Supervising Physician: Sandi Mariscal  Patient Status:Out-pt  HPI: Melissa Alvarez is an 40 y.o. female with a history of cervical cancer, Stage IB.  Since underwent surgery last year and has since been struggling with pain in her right groin.  It was initially felt to be secondary to her surgery, but it has persisted.  She underwent a biopsy of these same LNs a year ago which was benign.  She does have some lymphedema on that side as well in her leg and hip area.  She has been followed by Dr. Denman George for her cancer as well as for this complaint.  She underwent an [place] recently that revealed persistent LAD.  A request has been made for a biopsy to rule out progression of her disease.  Past Medical History:  Past Medical History:  Diagnosis Date  . Anemia   . Asthma   . Cancer ([place])    cervical  . COPD (chronic obstructive pulmonary disease) ([place])   . Difficulty sleeping   . Family history of adverse reaction to anesthesia    pts daughter awaken during surgery   . Gastric ulcer    x 5  . History of blood transfusion    last one 2 years ago   . Numbness and tingling    right hip area following June 2016 surgery pt states has improved   . Postoperative nausea and vomiting 01/21/2015  . Shortness of breath dyspnea    exertion     Past Surgical History:  Past Surgical History:  Procedure Laterality Date  . APPENDECTOMY    . CERVICAL CONE BIOPSY     x 6  . CERVICAL CONIZATION W/BX N/A 12/08/2014   Procedure: COLD KNIFE CONIZATION OF CERVIX;  Surgeon: [name], MD;  Location: [facility];  Service: Gynecology;  Laterality: N/A;  . CESAREAN SECTION     x 2  . CHOLECYSTECTOMY    . DILATION AND CURETTAGE OF UTERUS    . PELVIC LYMPH NODE DISSECTION N/A 01/19/2015   Procedure: PELVIC LYMPH NODE DISSECTION;  Surgeon: [name], MD;  Location: [facility];  Service: Gynecology;  Laterality: N/A;  . ROBOTIC  ASSISTED TOTAL HYSTERECTOMY N/A 01/19/2015   Procedure: ROBOTIC ASSISTED RADICAL HYSTERECTOMY RIGHT TUBE AND OVARY, LEFT TUBE;  Surgeon: [name], MD;  Location: [facility];  Service: Gynecology;  Laterality: N/A;  . SHOULDER SURGERY Left   . TONSILLECTOMY      Family History:  Family History  Problem Relation Age of Onset  . Cancer Mother   . Hypertension Mother   . Diabetes Mother   . CAD Mother   . Diabetes Other   . Hypertension Other   . Cancer Other   . CAD Other     Social History:  reports that she has been smoking Cigarettes.  She has a 48.00 pack-year smoking history. She has never used smokeless tobacco. She reports that she uses drugs, including Marijuana. She reports that she does not drink alcohol.  Allergies:  Allergies  Allergen Reactions  . Other Hives and Anaphylaxis    Steroids. Can take solumedrol by IV Clinical steroids except for solu medrol  . Acetaminophen Hives and Rash    With codeine States she can take percocet   . Erythromycin Hives, Itching and Rash  . Penicillins Hives, Itching and Rash    Has patient had a PCN reaction causing immediate rash, facial/tongue/throat swelling, SOB  or lightheadedness with hypotension: Yes Has patient had a PCN reaction causing severe rash involving mucus membranes or skin necrosis: No Has patient had a PCN reaction that required hospitalization No Has patient had a PCN reaction occurring within the last 10 years: No  If all of the above answers are "NO", then may proceed with Cephalosporin use.   Marland Kitchen Doxycycline Hives, Itching and Rash    Other reaction(s): Other (See Comments) Unsure  . Erythromycin Base Rash  . Motrin [Ibuprofen] Hives, Itching and Rash    Medications: Medications reviewed in Epic  Please HPI for pertinent positives, otherwise complete 10 system ROS negative.  Mallampati Score: MD Evaluation Airway: WNL Heart: WNL Abdomen: WNL Chest/ Lungs: WNL ASA  Classification: 2 Mallampati/Airway  Score: One  Physical Exam: BP (!) 142/88   Pulse 93   Temp 98.1 F (36.7 C)   Resp 20   Ht 5\' 6"  (1.676 m)   Wt 200 lb (90.7 kg)   LMP 12/08/2014 (Exact Date)   SpO2 100%   BMI 32.28 kg/m  Body mass index is 32.28 kg/m. General: pleasant, WD, WN black female who is laying in bed in NAD HEENT: head is normocephalic, atraumatic.  Sclera are noninjected.  PERRL.  Ears and nose without any masses or lesions.  Mouth is pink and moist Heart: regular, rate, and rhythm.  Normal s1,s2. No obvious murmurs, gallops, or rubs noted.  Palpable radial and pedal pulses bilaterally Lungs: CTAB, no wheezes, rhonchi, or rales noted.  Respiratory effort nonlabored Abd: soft, NT, ND, +BS, no masses, hernias, or organomegaly Lymph: palpable right inguinal lymph nodes.  These are tender to palpation. Psych: A&Ox3 with an appropriate affect.   Labs: Results for orders placed or performed during the hospital encounter of 04/05/16 (from the past 48 hour(s))  APTT upon arrival     Status: None   Collection Time: 04/05/16 11:44 AM  Result Value Ref Range   aPTT 32 24 - 36 seconds  CBC upon arrival     Status: None   Collection Time: 04/05/16 11:44 AM  Result Value Ref Range   WBC 10.2 4.0 - 10.5 K/uL   RBC 4.55 3.87 - 5.11 MIL/uL   Hemoglobin 13.9 12.0 - 15.0 g/dL   HCT 42.8 36.0 - 46.0 %   MCV 94.1 78.0 - 100.0 fL   MCH 30.5 26.0 - 34.0 pg   MCHC 32.5 30.0 - 36.0 g/dL   RDW 13.4 11.5 - 15.5 %   Platelets 220 150 - 400 K/uL  Protime-INR upon arrival     Status: None   Collection Time: 04/05/16 11:44 AM  Result Value Ref Range   Prothrombin Time 13.4 11.4 - 15.2 seconds   INR 1.02     Imaging: No results found.  Assessment/Plan 1. Persistent right inguinal LAD, h/o cervical cancer -we will proceed today with repeat right inguinal LN BX -labs and vitals reviewed -Risks and Benefits discussed with the patient including, but not limited to bleeding, infection, damage to adjacent structures or  low yield requiring additional tests. All of the patient's questions were answered, patient is agreeable to proceed. Consent signed and in chart.  Thank you for this interesting consult.  I greatly enjoyed meeting Falak L Stalling and look forward to participating in their care.  A copy of this report was sent to the requesting provider on this date.  Electronically Signed: Henreitta Cea 04/05/2016, 12:26 PM   I spent a total of  30 Minutes  in face to face in clinical consultation, greater than 50% of which was counseling/coordinating care for right inguinal lymphadenopathy

## 2016-04-05 NOTE — Sedation Documentation (Signed)
Pt very anxious and crying.

## 2016-04-05 NOTE — Sedation Documentation (Signed)
Pt is resting well. Vitals are stable at this time, RN at bedside.

## 2016-04-05 NOTE — Sedation Documentation (Signed)
Attempted to call SS. No answer after multiple rings

## 2016-04-05 NOTE — Sedation Documentation (Signed)
Pt resting quietly.

## 2016-04-18 DIAGNOSIS — R1031 Right lower quadrant pain: Secondary | ICD-10-CM | POA: Diagnosis not present

## 2016-05-01 ENCOUNTER — Telehealth: Payer: Self-pay

## 2016-05-01 NOTE — Telephone Encounter (Signed)
Late entry for April 25, 2016 . Orders received from [facility] to contact the patient with surgical pathology report for lymph node ,needle core biopsy , right inguinal. "no cancer ,if symptoms persist the patient can update Dr Denman George during her next follow up". Patient contacted and updated , patient states she would like to be seen sooner than December 2017 . Melissa Cross , APNP updated . Orders received to schedule the patient in November with Dr Katha Hamming. The patient was contacted by Franki Cabot and an appointment was scheduled for May 08, 2016 at 09:30 AM .

## 2016-05-08 ENCOUNTER — Ambulatory Visit: Payer: Medicare Other | Attending: Gynecologic Oncology | Admitting: Gynecologic Oncology

## 2016-05-08 ENCOUNTER — Encounter: Payer: Self-pay | Admitting: Gynecologic Oncology

## 2016-05-08 VITALS — BP 146/89 | HR 73 | Temp 99.0°F | Resp 18 | Wt 196.4 lb

## 2016-05-08 DIAGNOSIS — Z79899 Other long term (current) drug therapy: Secondary | ICD-10-CM | POA: Insufficient documentation

## 2016-05-08 DIAGNOSIS — Z809 Family history of malignant neoplasm, unspecified: Secondary | ICD-10-CM | POA: Insufficient documentation

## 2016-05-08 DIAGNOSIS — Z9049 Acquired absence of other specified parts of digestive tract: Secondary | ICD-10-CM | POA: Insufficient documentation

## 2016-05-08 DIAGNOSIS — R159 Full incontinence of feces: Secondary | ICD-10-CM | POA: Insufficient documentation

## 2016-05-08 DIAGNOSIS — I890 Lymphedema, not elsewhere classified: Secondary | ICD-10-CM | POA: Diagnosis not present

## 2016-05-08 DIAGNOSIS — Z9889 Other specified postprocedural states: Secondary | ICD-10-CM | POA: Insufficient documentation

## 2016-05-08 DIAGNOSIS — R32 Unspecified urinary incontinence: Secondary | ICD-10-CM

## 2016-05-08 DIAGNOSIS — Z8541 Personal history of malignant neoplasm of cervix uteri: Secondary | ICD-10-CM

## 2016-05-08 DIAGNOSIS — Z8719 Personal history of other diseases of the digestive system: Secondary | ICD-10-CM | POA: Diagnosis not present

## 2016-05-08 DIAGNOSIS — M7989 Other specified soft tissue disorders: Secondary | ICD-10-CM | POA: Insufficient documentation

## 2016-05-08 DIAGNOSIS — C539 Malignant neoplasm of cervix uteri, unspecified: Secondary | ICD-10-CM | POA: Insufficient documentation

## 2016-05-08 DIAGNOSIS — F17210 Nicotine dependence, cigarettes, uncomplicated: Secondary | ICD-10-CM | POA: Insufficient documentation

## 2016-05-08 DIAGNOSIS — R1909 Other intra-abdominal and pelvic swelling, mass and lump: Secondary | ICD-10-CM | POA: Diagnosis not present

## 2016-05-08 DIAGNOSIS — R339 Retention of urine, unspecified: Secondary | ICD-10-CM | POA: Diagnosis not present

## 2016-05-08 DIAGNOSIS — J449 Chronic obstructive pulmonary disease, unspecified: Secondary | ICD-10-CM | POA: Diagnosis not present

## 2016-05-08 DIAGNOSIS — Z880 Allergy status to penicillin: Secondary | ICD-10-CM | POA: Diagnosis not present

## 2016-05-08 DIAGNOSIS — G8929 Other chronic pain: Secondary | ICD-10-CM | POA: Diagnosis not present

## 2016-05-08 DIAGNOSIS — Z833 Family history of diabetes mellitus: Secondary | ICD-10-CM | POA: Diagnosis not present

## 2016-05-08 DIAGNOSIS — Z8249 Family history of ischemic heart disease and other diseases of the circulatory system: Secondary | ICD-10-CM | POA: Insufficient documentation

## 2016-05-08 DIAGNOSIS — Z888 Allergy status to other drugs, medicaments and biological substances status: Secondary | ICD-10-CM | POA: Diagnosis not present

## 2016-05-08 NOTE — Patient Instructions (Signed)
Preparing for your Surgery  Plan for surgery on June 01, 2016 with Dr. [name] at [facility] will be scheduled for a right inguinal lymph node dissection, JP drain placement.  Pre-operative Testing -You will receive a phone call from presurgical testing at [facility] to arrange for a pre-operative testing appointment before your surgery.  This appointment normally occurs one to two weeks before your scheduled surgery.   -Bring your insurance card, copy of an advanced directive if applicable, medication list  -At that visit, you will be asked to sign a consent for a possible blood transfusion in case a transfusion becomes necessary during surgery.  The need for a blood transfusion is rare but having consent is a necessary part of your care.     -You should not be taking blood thinners or aspirin at least ten days prior to surgery unless instructed by your surgeon.  Day Before Surgery at [place] will be advised to have nothing to eat or drink after midnight the evening before.    Your role in recovery Your role is to become active as soon as directed by your doctor, while still giving yourself time to heal.  Rest when you feel tired. You will be asked to do the following in order to speed your recovery:  - Cough and breathe deeply. This helps toclear and expand your lungs and can prevent pneumonia. You may be given a spirometer to practice deep breathing. A staff member will show you how to use the spirometer. - Do mild physical activity. Walking or moving your legs help your circulation and body functions return to normal. A staff member will help you when you try to walk and will provide you with simple exercises. Do not try to get up or walk alone the first time. - Actively manage your pain. Managing your pain lets you move in comfort. We will ask you to rate your pain on a scale of zero to 10. It is your responsibility to tell your doctor or  nurse where and how much you hurt so your pain can be treated.  Special Considerations -If you are diabetic, you may be placed on insulin after surgery to have closer control over your blood sugars to promote healing and recovery.  This does not mean that you will be discharged on insulin.  If applicable, your oral antidiabetics will be resumed when you are tolerating a solid diet.  -Your final pathology results from surgery should be available by the Friday after surgery and the results will be relayed to you when available.   Blood Transfusion Information WHAT IS A BLOOD TRANSFUSION? A transfusion is the replacement of blood or some of its parts. Blood is made up of multiple cells which provide different functions.  Red blood cells carry oxygen and are used for blood loss replacement.  White blood cells fight against infection.  Platelets control bleeding.  Plasma helps clot blood.  Other blood products are available for specialized needs, such as hemophilia or other clotting disorders. BEFORE THE TRANSFUSION  Who gives blood for transfusions?   You may be able to donate blood to be used at a later date on yourself (autologous donation).  Relatives can be asked to donate blood. This is generally not any safer than if you have received blood from a stranger. The same precautions are taken to ensure safety when a relative's blood is donated.  Healthy volunteers who are fully evaluated to make sure  their blood is safe. This is blood bank blood. Transfusion therapy is the safest it has ever been in the practice of medicine. Before blood is taken from a donor, a complete history is taken to make sure that person has no history of diseases nor engages in risky social behavior (examples are intravenous drug use or sexual activity with multiple partners). The donor's travel history is screened to minimize risk of transmitting infections, such as malaria. The donated blood is tested for signs  of infectious diseases, such as HIV and hepatitis. The blood is then tested to be sure it is compatible with you in order to minimize the chance of a transfusion reaction. If you or a relative donates blood, this is often done in anticipation of surgery and is not appropriate for emergency situations. It takes many days to process the donated blood. RISKS AND COMPLICATIONS Although transfusion therapy is very safe and saves many lives, the main dangers of transfusion include:   Getting an infectious disease.  Developing a transfusion reaction. This is an allergic reaction to something in the blood you were given. Every precaution is taken to prevent this. The decision to have a blood transfusion has been considered carefully by your caregiver before blood is given. Blood is not given unless the benefits outweigh the risks.  Bulb Drain Home Care A bulb drain consists of a thin rubber tube and a soft, round bulb that creates a gentle suction. The rubber tube is placed in the area where you had surgery. A bulb is attached to the end of the tube that is outside the body. The bulb drain removes excess fluid that normally builds up in a surgical wound after surgery. The color and amount of fluid will vary. Immediately after surgery, the fluid is bright red and is a little thicker than water. It may gradually change to a yellow or pink color and become more thin and water-like. When the amount decreases to about 1 or 2 tbsp in 24 hours, your health care provider will usually remove it. DAILY CARE  Keep the bulb flat (compressed) at all times, except while emptying it. The flatness creates suction. You can flatten the bulb by squeezing it firmly in the middle and then closing the cap.  Keep sites where the tube enters the skin dry and covered with a bandage (dressing).  Secure the tube 1-2 in (2.5-5.1 cm) below the insertion sites to keep it from pulling on your stitches. The tube is stitched in place and  will not slip out.  Secure the bulb as directed by your health care provider.  For the first 3 days after surgery, there usually is more fluid in the bulb. Empty the bulb whenever it becomes half full because the bulb does not create enough suction if it is too full. The bulb could also overflow. Write down how much fluid you remove each time you empty your drain. Add up the amount removed in 24 hours.  Empty the bulb at the same time every day once the amount of fluid decreases and you only need to empty it once a day. Write down the amounts and the 24-hour totals to give to your health care provider. This helps your health care provider know when the tubes can be removed. EMPTYING THE BULB DRAIN Before emptying the bulb, get a measuring cup, a piece of paper and a pen, and wash your hands.  Gently run your fingers down the tube (stripping) to empty any drainage  from the tubing into the bulb. This may need to be done several times a day to clear the tubing of clots and tissue.  Open the bulb cap to release suction, which causes it to inflate. Do not touch the inside of the cap.  Gently run your fingers down the tube (stripping) to empty any drainage from the tubing into the bulb.  Hold the cap out of the way, and pour fluid into the measuring cup.   Squeeze the bulb to provide suction.  Replace the cap.   Check the tape that holds the tube to your skin. If it is becoming loose, you can remove the loose piece of tape and apply a new one. Then, pin the bulb to your shirt.   Write down the amount of fluid you emptied out. Write down the date and each time you emptied your bulb drain. (If there are 2 bulbs, note the amount of drainage from each bulb and keep the totals separate. Your health care provider will want to know the total amounts for each drain and which tube is draining more.)   Flush the fluid down the toilet and wash your hands.   Call your health care provider once you  have less than 2 tbsp of fluid collecting in the bulb drain every 24 hours. If there is drainage around the tube site, change dressings and keep the area dry. Cleanse around tube with sterile saline and place dry gauze around site. This gauze should be changed when it is soiled. If it stays clean and unsoiled, it should still be changed daily.  SEEK MEDICAL CARE IF:  Your drainage has a bad smell or is cloudy.   You have a fever.   Your drainage is increasing instead of decreasing.   Your tube fell out.   You have redness or swelling around the tube site.   You have drainage from a surgical wound.   Your bulb drain will not stay flat after you empty it.  MAKE SURE YOU:   Understand these instructions.  Will watch your condition.  Will get help right away if you are not doing well or get worse.   This information is not intended to replace advice given to you by your health care provider. Make sure you discuss any questions you have with your health care provider.   Document Released: 06/16/2000 Document Revised: 07/10/2014 Document Reviewed: 01/06/2015 Elsevier Interactive Patient Education [employer].

## 2016-05-08 NOTE — Progress Notes (Signed)
POSTOPERATIVE FOLLOWUP Assessment:    40 y.o. year old with Stage IB1 cervical cancer.   S/p modified radical hysterectomy with bilateral pelvic lymphadenectomy on 01/19/15.  Postop bladder dysfunction/urinary retention: decreased sense of urinary urge, and fecal incontinence. Postop right LE lymphdedema and left groin/lower abdomen lymphedema.  No evidence of cancer recurrence.  Plan: 1) stage IB1 cervical cancer. Recommend continued q 3 monthly surveillance visits until July 2018 (then 6 monthly) and annual vaginal cytology to screen for new HPV related disease. No evidence for recurrence at today's exam. 2) Urinary retention: counseled patient about regular voiding - every 3 hours, and positioning to aid in voiding. Discussed risks of permanent bladder dysfunction if she does not void regularly. 3)  Fecal incontinence: have refered patient to see Dr [place] Pink for evaluation and possible pelvic floor muscle training. Recommended metamucil for stool bulking. 4) right inguinal adenopathy - benign on repeated biopsy, however, since there has been progression in size and the patient reports being very symptomatic from these, I offered right inguinolymphadenectomy to debulk the adenopathy. I explained that this was by no means a guaranteed cure of her symptoms, and there would be a high probability for reformation of nodularity and granulation tissue from scar. I also explained that it may make her Rt lower extremity lymphedema worse as we will be adding to the disruption of return of lymph from her leg. I discussed that her pain that is caused by edema and "puffiness" would be unchanged by this procedure and may be made worse. I discussed that I felt that the risks of her symptoms remaining stable or progressing to worse were substantially high if we perform this procedure, however there was a small possibility that her symptom of pain from the nodularity of the inguinal nodes would improve. This was  enough for the patient to decide that she wanted to pursue surgery. Her daughter, [name], expressed reservations which she voiced "it looks like this is a lose lose situation". I explained that it would not be my first recommendation as I think that the risk is high for non-resolution of symptoms or making them worse, however the patient felt strongly about proceeding with surgery.  I explained the surgery, its incision, the use of a postoperative drain and anticipated duration of drain and drain care. I explained risks including bleeding, infection, nerve injury, progressive lymphedema, wound separation. I explained that we would not be able to prescribe more than 6 weeks of postoperative analgesia. I explained that her tobacco abuse places her at increased risk for wound separation and infection. I encouraged her to cut back or stop preop.   5) lymphedema - continue PT (appreciate their input). I discussed with [name] that there is no surgical or medication intervention to reduce lymphedema. I discussed the chronic nature of the disorder. I explained that after an inguinal lymphadenectomy this will likely progress. She acknowledged that fact verbally. 5) chronic pain:  Have referred her to chronic pain clinic for chronic prescription opioid use (this predated her seeing me for her cervical cancer). However she was fired from practices due to positive tox screens. Have prescribed the patient 30tabs of short acting oral morphine. I explained the importance of quitting marajuana use in order to be eligible for care at a chronic pain clinic.  HPI:  Melissa Alvarez is a 40 y.o. year old G2P2000 initially seen in consultation on 11/24/14 for cervical cancer.  She then underwent a cold knife conization on [id number] without complications.  Her postoperative course was complicated by a trip to the ER for severe pain post cone. CT imaging showed no abscess or abnormality. Her white count was mildly elevated but she  was afebrile. She was discharged from the ER and I prescribed a course of doxy and flagl for presumed cervicitis. She continues to have pain postop op and so I repeated her CT imaging on 12/23/14 and this showed slightly more pelvic fluid, but no abscess. She has multiple fibroids, one of which appears to be degenerating.   Her final pathology from her cone revealed invasive moderately differentiated SCC measuring 1.48mm in greatest dimension with 1.62mm depth of invasion. There were multiple foci all less than 80mm. LVSI was present. 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 The post cone endocervical curretting was positive for fragments of squamous cell carcinoma.  On 01/19/15 she underwent robotic assisted type II modified radical hysterectomy, with RSO and bilateral pelvic lymphadenectomy. Pathology revealed CIS but no residual carcinoma and lymph nodes were negative. She did not meet criteria for needing adjuvant therapy.  Postop she experienced severe nausea and emesis and abdominal pain in the 1st week, and required readmission for supportive care, however she resolved symptoms within 24 hours. CT imaging was unremarkable.  She developed inguinal masses postoperatively (on the right). These were biopsied for benign findings (benign reactive lymph node with dermatopathologic changes) in August, 2016.  In December 2016 she reported anal leakage, and poor sensation of a full rectum. She needs to defecate 20 minutes after eating and notes her stools are looser than usual. She has had several "accidents". These symptoms have developed since surgery.  Postoperatively she developed fluctuating fullness in the right groin.The pain was very bothersome and limits quality of life and has caused multiple ED visits.   She had an MRI of the pelvis on 09/14/15 which showed no etiology for her leg pain and no lymphadenopathy. She was sent to PT who diagnosed her with RLE lymphedema and are actively treating her for  this. She was referred to pain clinics for her chronic pain use without an apparent active acute source, however she was fired when her tox screens were positive for Marijuana.   Interval Hx: She undewent US of the right groing on 03/28/16 which showed increase in prominent lymph nodes to 1.8cm. US guided biopsy on 04/05/16 showed benign spindle cell proliferation. She feels swelling intermittently in the right groin and lower abdomen, less so in the leg and thigh. She feels this is life affecting and deeply negatively impacts QOL. She reports that the swelling is bothersome, but it is the nodular lumps in the groin that are of particular concern and pain to her.  Current Outpatient Prescriptions on File Prior to Visit  Medication Sig Dispense Refill  . albuterol (ACCUNEB) 0.63 MG/3ML nebulizer solution Take 1 ampule by nebulization every 6 (six) hours as needed for Wheezing.    Marland Kitchen albuterol (PROVENTIL HFA;VENTOLIN HFA) 108 (90 Base) MCG/ACT inhaler Inhale 2 puffs into the lungs once. (Patient not taking: Reported on 05/08/2016) 1 Inhaler 1  . morphine (MSIR) 15 MG tablet Take 1 tablet (15 mg total) by mouth every 4 (four) hours as needed for severe pain. (Patient not taking: Reported on 05/08/2016) 30 tablet 0   No current facility-administered medications on file prior to visit.     Allergies  Allergen Reactions  . Other Hives and Anaphylaxis    Steroids. Can take solumedrol by IV Clinical steroids except for solu medrol  .  Acetaminophen Hives and Rash    With codeine States she can take percocet   . Erythromycin Hives, Itching and Rash  . Penicillins Hives, Itching and Rash    Has patient had a PCN reaction causing immediate rash, facial/tongue/throat swelling, SOB or lightheadedness with hypotension: Yes Has patient had a PCN reaction causing severe rash involving mucus membranes or skin necrosis: No Has patient had a PCN reaction that required hospitalization No Has patient had a PCN  reaction occurring within the last 10 years: No  If all of the above answers are "NO", then may proceed with Cephalosporin use.   Marland Kitchen Doxycycline Hives, Itching and Rash    Other reaction(s): Other (See Comments) Unsure  . Erythromycin Base Rash  . Motrin [Ibuprofen] Hives, Itching and Rash    Past Medical History:  Diagnosis Date  . Anemia   . Asthma   . Cancer ([place])    cervical  . COPD (chronic obstructive pulmonary disease) ([place])   . Difficulty sleeping   . Family history of adverse reaction to anesthesia    pts daughter awaken during surgery   . Gastric ulcer    x 5  . History of blood transfusion    last one 2 years ago   . Numbness and tingling    right hip area following June 2016 surgery pt states has improved   . Postoperative nausea and vomiting 01/21/2015  . Shortness of breath dyspnea    exertion    Past Surgical History:  Procedure Laterality Date  . APPENDECTOMY    . CERVICAL CONE BIOPSY     x 6  . CERVICAL CONIZATION W/BX N/A 12/08/2014   Procedure: COLD KNIFE CONIZATION OF CERVIX;  Surgeon: [name], MD;  Location: [facility];  Service: Gynecology;  Laterality: N/A;  . CESAREAN SECTION     x 2  . CHOLECYSTECTOMY    . DILATION AND CURETTAGE OF UTERUS    . PELVIC LYMPH NODE DISSECTION N/A 01/19/2015   Procedure: PELVIC LYMPH NODE DISSECTION;  Surgeon: [name], MD;  Location: [facility];  Service: Gynecology;  Laterality: N/A;  . ROBOTIC ASSISTED TOTAL HYSTERECTOMY N/A 01/19/2015   Procedure: ROBOTIC ASSISTED RADICAL HYSTERECTOMY RIGHT TUBE AND OVARY, LEFT TUBE;  Surgeon: [name], MD;  Location: [facility];  Service: Gynecology;  Laterality: N/A;  . SHOULDER SURGERY Left   . TONSILLECTOMY     Family History  Problem Relation Age of Onset  . Cancer Mother   . Hypertension Mother   . Diabetes Mother   . CAD Mother   . Diabetes Other   . Hypertension Other   . Cancer Other   . CAD Other    Social History   Social History  . Marital status: Single     Spouse name: N/A  . Number of children: N/A  . Years of education: N/A   Occupational History  . Not on file.   Social History Main Topics  . Smoking status: Current Every Day Smoker    Packs/day: 3.00    Years: 24.00    Types: Cigarettes  . Smokeless tobacco: Never Used  . Alcohol use No     Comment: past hx of occas use   . Drug use:     Types: Marijuana     Comment: not currently  . Sexual activity: No   Other Topics Concern  . Not on file   Social History Narrative  . No narrative on file     Review of  systems: Constitutional:  She has no weight gain or weight loss. She has no fever or chills. Eyes: No blurred vision Ears, Nose, Mouth, Throat: No dizziness, headaches or changes in hearing. No mouth sores. Cardiovascular: No chest pain, palpitations or edema. Respiratory:  No shortness of breath, wheezing or cough Gastrointestinal: She has normal bowel movements without diarrhea or constipation. She denies any nausea or vomiting. She denies blood in her stool or heart burn. + fecal incontinence Genitourinary:  + groin pain, +bleeding Musculoskeletal: Denies muscle weakness or joint pains.  Skin:  She has no skin changes, rashes or itching Neurological:  No headache Psychiatric:  She denies depression or anxiety. Hematologic/Lymphatic:   No easy bruising or bleeding   Physical Exam: Blood pressure (!) 146/89, pulse 73, temperature 99 F (37.2 C), temperature source Oral, resp. rate 18, weight 196 lb 6.4 oz (89.1 kg), last menstrual period 12/08/2014, SpO2 100 %. General: Well dressed, well nourished in no apparent distress.   HEENT:  Normocephalic and atraumatic, no lesions.  Extraocular muscles intact. Sclerae anicteric. Pupils equal, round, reactive. No mouth sores or  Abdomen:  Soft, nontender, nondistended.  No palpable masses.  No hepatosplenomegaly.  No ascites. Normal bowel sounds.  No hernias.  Well healed incisions. Genitourinary: Normal EGBUS  Vaginal cuff  in tact with no abnormal drainage. Healednormally, no masses or lesions. Rectal: grossly normal tone. In tact sphincter palpably. No masses. Extremities: Mild right LE edema compared to left. There is right sided mons pubis and inguinal and lower abdominal lymphedema. Psychiatric: Mood and affect are appropriate. Neurological: Awake, alert and oriented x 3. Sensation is intact, no neuropathy.  Musculoskeletal: No pain, normal strength and range of motion. Lymph nodes: right inguinal region has palpable tender nodes that are most prominent medial in approximation to pubic tubercle. These measure approximately 2cm.  No cervical adenopathy.  [name], MD

## 2016-05-09 NOTE — Progress Notes (Signed)
Pt is being scheduled for preop appt; please place surgical orders in epic. Thanks.  

## 2016-05-30 ENCOUNTER — Encounter (HOSPITAL_COMMUNITY): Payer: Self-pay

## 2016-05-30 ENCOUNTER — Encounter (HOSPITAL_COMMUNITY)
Admission: RE | Admit: 2016-05-30 | Discharge: 2016-05-30 | Disposition: A | Discharge status: Home / Self Care | Payer: Medicare Other | Source: Ambulatory Visit | Attending: Gynecologic Oncology | Admitting: Gynecologic Oncology

## 2016-05-30 ENCOUNTER — Encounter (INDEPENDENT_AMBULATORY_CARE_PROVIDER_SITE_OTHER): Payer: Self-pay

## 2016-05-30 DIAGNOSIS — R1030 Lower abdominal pain, unspecified: Secondary | ICD-10-CM | POA: Diagnosis not present

## 2016-05-30 DIAGNOSIS — F17210 Nicotine dependence, cigarettes, uncomplicated: Secondary | ICD-10-CM | POA: Diagnosis not present

## 2016-05-30 DIAGNOSIS — Z01818 Encounter for other preprocedural examination: Secondary | ICD-10-CM

## 2016-05-30 DIAGNOSIS — R590 Localized enlarged lymph nodes: Secondary | ICD-10-CM | POA: Insufficient documentation

## 2016-05-30 DIAGNOSIS — J449 Chronic obstructive pulmonary disease, unspecified: Secondary | ICD-10-CM | POA: Diagnosis not present

## 2016-05-30 DIAGNOSIS — R159 Full incontinence of feces: Secondary | ICD-10-CM | POA: Diagnosis not present

## 2016-05-30 DIAGNOSIS — Z8541 Personal history of malignant neoplasm of cervix uteri: Secondary | ICD-10-CM | POA: Diagnosis not present

## 2016-05-30 DIAGNOSIS — E669 Obesity, unspecified: Secondary | ICD-10-CM | POA: Diagnosis not present

## 2016-05-30 DIAGNOSIS — Z6831 Body mass index (BMI) 31.0-31.9, adult: Secondary | ICD-10-CM | POA: Diagnosis not present

## 2016-05-30 DIAGNOSIS — R222 Localized swelling, mass and lump, trunk: Secondary | ICD-10-CM | POA: Diagnosis not present

## 2016-05-30 DIAGNOSIS — G8929 Other chronic pain: Secondary | ICD-10-CM | POA: Diagnosis not present

## 2016-05-30 DIAGNOSIS — R339 Retention of urine, unspecified: Secondary | ICD-10-CM | POA: Diagnosis not present

## 2016-05-30 DIAGNOSIS — I890 Lymphedema, not elsewhere classified: Secondary | ICD-10-CM | POA: Diagnosis not present

## 2016-05-30 HISTORY — DX: Major depressive disorder, single episode, unspecified: F32.9

## 2016-05-30 HISTORY — DX: Depression, unspecified: F32.A

## 2016-05-30 LAB — URINALYSIS, ROUTINE W REFLEX MICROSCOPIC
BILIRUBIN URINE: NEGATIVE
GLUCOSE, UA: NEGATIVE mg/dL
HGB URINE DIPSTICK: NEGATIVE
KETONES UR: NEGATIVE mg/dL
Leukocytes, UA: NEGATIVE
Nitrite: NEGATIVE
PROTEIN: NEGATIVE mg/dL
Specific Gravity, Urine: 1.014 (ref 1.005–1.030)
pH: 6.5 (ref 5.0–8.0)

## 2016-05-30 LAB — COMPREHENSIVE METABOLIC PANEL
ALBUMIN: 4 g/dL (ref 3.5–5.0)
ALT: 15 U/L (ref 14–54)
ANION GAP: 6 (ref 5–15)
AST: 22 U/L (ref 15–41)
Alkaline Phosphatase: 59 U/L (ref 38–126)
BILIRUBIN TOTAL: 0.6 mg/dL (ref 0.3–1.2)
BUN: 6 mg/dL (ref 6–20)
CO2: 24 mmol/L (ref 22–32)
Calcium: 8.9 mg/dL (ref 8.9–10.3)
Chloride: 107 mmol/L (ref 101–111)
Creatinine, Ser: 0.71 mg/dL (ref 0.44–1.00)
GFR calc Af Amer: >60 mL/min (ref >60)
GLUCOSE: 109 mg/dL — AB (ref 65–99)
POTASSIUM: 4 mmol/L (ref 3.5–5.1)
Sodium: 137 mmol/L (ref 135–145)
TOTAL PROTEIN: 7.7 g/dL (ref 6.5–8.1)

## 2016-05-30 LAB — CBC WITH DIFFERENTIAL/PLATELET
BASOS PCT: 0 %
Basophils Absolute: 0 10*3/uL (ref 0.0–0.1)
Eosinophils Absolute: 0.2 10*3/uL (ref 0.0–0.7)
Eosinophils Relative: 1 %
HEMATOCRIT: 41 % (ref 36.0–46.0)
Hemoglobin: 13.5 g/dL (ref 12.0–15.0)
Lymphocytes Relative: 16 %
Lymphs Abs: 2.2 10*3/uL (ref 0.7–4.0)
MCH: 30.5 pg (ref 26.0–34.0)
MCHC: 32.9 g/dL (ref 30.0–36.0)
MCV: 92.8 fL (ref 78.0–100.0)
MONO ABS: 0.6 10*3/uL (ref 0.1–1.0)
MONOS PCT: 4 %
NEUTROS ABS: 10.5 10*3/uL — AB (ref 1.7–7.7)
Neutrophils Relative %: 79 %
Platelets: 192 10*3/uL (ref 150–400)
RBC: 4.42 MIL/uL (ref 3.87–5.11)
RDW: 13.5 % (ref 11.5–15.5)
WBC: 13.5 10*3/uL — ABNORMAL HIGH (ref 4.0–10.5)

## 2016-05-30 NOTE — Patient Instructions (Addendum)
Melissa Alvarez  05/30/2016   Your procedure is scheduled on: Thursday June 01, 2016  Report to [facility]  Entrance take [place]  elevators to 3rd floor to  [place] at 8:00 AM.  Call this number if you have problems the morning of surgery [phone]   Remember: ONLY 1 PERSON MAY GO WITH YOU TO SHORT STAY TO GET  READY MORNING OF Melissa Alvarez.  Do not eat food or drink liquids :After Midnight.               EAT A LIGHT DIET THE DAY BEFORE SURGERY. EXAMPLES: SOUPS, BROTHS, TOAST, YOGURT, MASHED POTATOES. THINGS TO AVOID INCLUDE CARBONATED BEVERAGES (FIZZY BEVERAGES), RAW FRUITS AND RAW VEGETABLES, OR BEANS. IF YOUR BOWELS ARE FILLED WITH GAS, YOUR SURGEON WILL HAVE DIFFICULTY VISUALIZING YOUR PELVIC ORGANS WHICH INCREASES YOUR SURGICAL RISKS.     Take these medicines the morning of surgery: MAY USE ALBUTEROL NEBULIZER TREATMENT IF NEEDED                                 You may not have any metal on your body including hair pins and              piercings  Do not wear jewelry, make-up, lotions, powders or perfumes, deodorant             Do not wear nail polish.  Do not shave  48 hours prior to surgery.                 Do not bring valuables to the hospital. [name].  Contacts, dentures or bridgework may not be worn into surgery.  Leave suitcase in the car. After surgery it may be brought to your room.                Please read over the following fact sheets you were given:INCENTIVE SPIROMETER; BLOOD TRANSFUSION INFORMATION SHEET  _____________________________________________________________________             [facility] - Preparing for Surgery Before surgery, you can play an important role.  Because skin is not sterile, your skin needs to be as free of germs as possible.  You can reduce the number of germs on your skin by washing with CHG (chlorahexidine gluconate) soap before  surgery.  CHG is an antiseptic cleaner which kills germs and bonds with the skin to continue killing germs even after washing. Please DO NOT use if you have an allergy to CHG or antibacterial soaps.  If your skin becomes reddened/irritated stop using the CHG and inform your nurse when you arrive at Short Stay. Do not shave (including legs and underarms) for at least 48 hours prior to the first CHG shower.  You may shave your face/neck. Please follow these instructions carefully:  1.  Shower with CHG Soap the night before surgery and the  morning of Surgery.  2.  If you choose to wash your hair, wash your hair first as usual with your  normal  shampoo.  3.  After you shampoo, rinse your hair and body thoroughly to remove the  shampoo.  4.  Use CHG as you would any other liquid soap.  You can apply chg directly  to the skin and wash                       Gently with a scrungie or clean washcloth.  5.  Apply the CHG Soap to your body ONLY FROM THE NECK DOWN.   Do not use on face/ open                           Wound or open sores. Avoid contact with eyes, ears mouth and genitals (private parts).                       Wash face,  Genitals (private parts) with your normal soap.             6.  Wash thoroughly, paying special attention to the area where your surgery  will be performed.  7.  Thoroughly rinse your body with warm water from the neck down.  8.  DO NOT shower/wash with your normal soap after using and rinsing off  the CHG Soap.                9.  Pat yourself dry with a clean towel.            10.  Wear clean pajamas.            11.  Place clean sheets on your bed the night of your first shower and do not  sleep with pets. Day of Surgery : Do not apply any lotions/deodorants the morning of surgery.  Please wear clean clothes to the hospital/surgery center.  FAILURE TO FOLLOW THESE INSTRUCTIONS MAY RESULT IN THE CANCELLATION OF YOUR SURGERY PATIENT  SIGNATURE_________________________________  NURSE SIGNATURE__________________________________  ________________________________________________________________________   Melissa Alvarez  An incentive spirometer is a tool that can help keep your lungs clear and active. This tool measures how well you are filling your lungs with each breath. Taking long deep breaths may help reverse or decrease the chance of developing breathing (pulmonary) problems (especially infection) following:  A long period of time when you are unable to move or be active. BEFORE THE PROCEDURE   If the spirometer includes an indicator to show your best effort, your nurse or respiratory therapist will set it to a desired goal.  If possible, sit up straight or lean slightly forward. Try not to slouch.  Hold the incentive spirometer in an upright position. INSTRUCTIONS FOR USE  1. Sit on the edge of your bed if possible, or sit up as far as you can in bed or on a chair. 2. Hold the incentive spirometer in an upright position. 3. Breathe out normally. 4. Place the mouthpiece in your mouth and seal your lips tightly around it. 5. Breathe in slowly and as deeply as possible, raising the piston or the ball toward the top of the column. 6. Hold your breath for 3-5 seconds or for as long as possible. Allow the piston or ball to fall to the bottom of the column. 7. Remove the mouthpiece from your mouth and breathe out normally. 8. Rest for a few seconds and repeat Steps 1 through 7 at least 10 times every 1-2 hours when you are awake. Take your time and take a few normal breaths between deep breaths. 9. The spirometer may include an indicator to show  your best effort. Use the indicator as a goal to work toward during each repetition. 10. After each set of 10 deep breaths, practice coughing to be sure your lungs are clear. If you have an incision (the cut made at the time of surgery), support your incision when coughing  by placing a pillow or rolled up towels firmly against it. Once you are able to get out of bed, walk around indoors and cough well. You may stop using the incentive spirometer when instructed by your caregiver.  RISKS AND COMPLICATIONS  Take your time so you do not get dizzy or light-headed.  If you are in pain, you may need to take or ask for pain medication before doing incentive spirometry. It is harder to take a deep breath if you are having pain. AFTER USE  Rest and breathe slowly and easily.  It can be helpful to keep track of a log of your progress. Your caregiver can provide you with a simple table to help with this. If you are using the spirometer at home, follow these instructions: [place] IF:   You are having difficultly using the spirometer.  You have trouble using the spirometer as often as instructed.  Your pain medication is not giving enough relief while using the spirometer.  You develop fever of 100.5 F (38.1 C) or higher. SEEK IMMEDIATE MEDICAL CARE IF:   You cough up bloody sputum that had not been present before.  You develop fever of 102 F (38.9 C) or greater.  You develop worsening pain at or near the incision site. MAKE SURE YOU:   Understand these instructions.  Will watch your condition.  Will get help right away if you are not doing well or get worse. Document Released: 10/30/2006 Document Revised: 09/11/2011 Document Reviewed: 12/31/2006 ExitCare Patient Information 2014 ExitCare, [place].   ________________________________________________________________________  WHAT IS A BLOOD TRANSFUSION? Blood Transfusion Information  A transfusion is the replacement of blood or some of its parts. Blood is made up of multiple cells which provide different functions.  Red blood cells carry oxygen and are used for blood loss replacement.  White blood cells fight against infection.  Platelets control bleeding.  Plasma helps clot  blood.  Other blood products are available for specialized needs, such as hemophilia or other clotting disorders. BEFORE THE TRANSFUSION  Who gives blood for transfusions?   Healthy volunteers who are fully evaluated to make sure their blood is safe. This is blood bank blood. Transfusion therapy is the safest it has ever been in the practice of medicine. Before blood is taken from a donor, a complete history is taken to make sure that person has no history of diseases nor engages in risky social behavior (examples are intravenous drug use or sexual activity with multiple partners). The donor's travel history is screened to minimize risk of transmitting infections, such as malaria. The donated blood is tested for signs of infectious diseases, such as HIV and hepatitis. The blood is then tested to be sure it is compatible with you in order to minimize the chance of a transfusion reaction. If you or a relative donates blood, this is often done in anticipation of surgery and is not appropriate for emergency situations. It takes many days to process the donated blood. RISKS AND COMPLICATIONS Although transfusion therapy is very safe and saves many lives, the main dangers of transfusion include:   Getting an infectious disease.  Developing a transfusion reaction. This is an allergic reaction to  something in the blood you were given. Every precaution is taken to prevent this. The decision to have a blood transfusion has been considered carefully by your caregiver before blood is given. Blood is not given unless the benefits outweigh the risks. AFTER THE TRANSFUSION  Right after receiving a blood transfusion, you will usually feel much better and more energetic. This is especially true if your red blood cells have gotten low (anemic). The transfusion raises the level of the red blood cells which carry oxygen, and this usually causes an energy increase.  The nurse administering the transfusion will monitor  you carefully for complications. HOME CARE INSTRUCTIONS  No special instructions are needed after a transfusion. You may find your energy is better. Speak with your caregiver about any limitations on activity for underlying diseases you may have. SEEK MEDICAL CARE IF:   Your condition is not improving after your transfusion.  You develop redness or irritation at the intravenous (IV) site. SEEK IMMEDIATE MEDICAL CARE IF:  Any of the following symptoms occur over the next 12 hours:  Shaking chills.  You have a temperature by mouth above 102 F (38.9 C), not controlled by medicine.  Chest, back, or muscle pain.  People around you feel you are not acting correctly or are confused.  Shortness of breath or difficulty breathing.  Dizziness and fainting.  You get a rash or develop hives.  You have a decrease in urine output.  Your urine turns a dark color or changes to pink, red, or brown. Any of the following symptoms occur over the next 10 days:  You have a temperature by mouth above 102 F (38.9 C), not controlled by medicine.  Shortness of breath.  Weakness after normal activity.  The white part of the eye turns yellow (jaundice).  You have a decrease in the amount of urine or are urinating less often.  Your urine turns a dark color or changes to pink, red, or brown. Document Released: 06/16/2000 Document Revised: 09/11/2011 Document Reviewed: 02/03/2008 [facility] Patient Information 2014 [place], [place].  _______________________________________________________________________

## 2016-06-01 ENCOUNTER — Ambulatory Visit (HOSPITAL_COMMUNITY): Payer: Medicare Other | Admitting: Anesthesiology

## 2016-06-01 ENCOUNTER — Ambulatory Visit (HOSPITAL_COMMUNITY)
Admission: RE | Admit: 2016-06-01 | Discharge: 2016-06-01 | Disposition: A | Discharge status: Home / Self Care | Payer: Medicare Other | Source: Ambulatory Visit | Attending: Gynecologic Oncology | Admitting: Gynecologic Oncology

## 2016-06-01 ENCOUNTER — Encounter (HOSPITAL_COMMUNITY): Payer: Self-pay | Admitting: *Deleted

## 2016-06-01 ENCOUNTER — Encounter (HOSPITAL_COMMUNITY)
Admission: RE | Disposition: A | Discharge status: Home / Self Care | Payer: Self-pay | Source: Ambulatory Visit | Attending: Gynecologic Oncology

## 2016-06-01 DIAGNOSIS — R159 Full incontinence of feces: Secondary | ICD-10-CM | POA: Diagnosis not present

## 2016-06-01 DIAGNOSIS — G8929 Other chronic pain: Secondary | ICD-10-CM | POA: Insufficient documentation

## 2016-06-01 DIAGNOSIS — I890 Lymphedema, not elsewhere classified: Secondary | ICD-10-CM

## 2016-06-01 DIAGNOSIS — R590 Localized enlarged lymph nodes: Secondary | ICD-10-CM | POA: Diagnosis not present

## 2016-06-01 DIAGNOSIS — E669 Obesity, unspecified: Secondary | ICD-10-CM | POA: Insufficient documentation

## 2016-06-01 DIAGNOSIS — Z6831 Body mass index (BMI) 31.0-31.9, adult: Secondary | ICD-10-CM | POA: Insufficient documentation

## 2016-06-01 DIAGNOSIS — R222 Localized swelling, mass and lump, trunk: Secondary | ICD-10-CM | POA: Diagnosis not present

## 2016-06-01 DIAGNOSIS — J449 Chronic obstructive pulmonary disease, unspecified: Secondary | ICD-10-CM | POA: Insufficient documentation

## 2016-06-01 DIAGNOSIS — R339 Retention of urine, unspecified: Secondary | ICD-10-CM | POA: Insufficient documentation

## 2016-06-01 DIAGNOSIS — R1909 Other intra-abdominal and pelvic swelling, mass and lump: Secondary | ICD-10-CM | POA: Diagnosis present

## 2016-06-01 DIAGNOSIS — Z8541 Personal history of malignant neoplasm of cervix uteri: Secondary | ICD-10-CM | POA: Insufficient documentation

## 2016-06-01 DIAGNOSIS — R1030 Lower abdominal pain, unspecified: Secondary | ICD-10-CM | POA: Insufficient documentation

## 2016-06-01 DIAGNOSIS — C539 Malignant neoplasm of cervix uteri, unspecified: Secondary | ICD-10-CM | POA: Diagnosis present

## 2016-06-01 DIAGNOSIS — F17210 Nicotine dependence, cigarettes, uncomplicated: Secondary | ICD-10-CM | POA: Insufficient documentation

## 2016-06-01 HISTORY — PX: LYMPH NODE DISSECTION: SHX5087

## 2016-06-01 LAB — TYPE AND SCREEN
ABO/RH(D): O POS
ANTIBODY SCREEN: NEGATIVE

## 2016-06-01 SURGERY — LYMPH NODE DISSECTION
Anesthesia: General

## 2016-06-01 MED ORDER — PROPOFOL 10 MG/ML IV BOLUS
INTRAVENOUS | Status: DC | PRN
  Administered 2016-06-01: 180 mg via INTRAVENOUS

## 2016-06-01 MED ORDER — LACTATED RINGERS IV SOLN
INTRAVENOUS | Status: DC
  Administered 2016-06-01 (×2): via INTRAVENOUS

## 2016-06-01 MED ORDER — FENTANYL CITRATE (PF) 100 MCG/2ML IJ SOLN
INTRAMUSCULAR | Status: AC
  Filled 2016-06-01: qty 4

## 2016-06-01 MED ORDER — MIDAZOLAM HCL 2 MG/2ML IJ SOLN
INTRAMUSCULAR | Status: AC
  Filled 2016-06-01: qty 2

## 2016-06-01 MED ORDER — CLINDAMYCIN PHOSPHATE 900 MG/50ML IV SOLN
900.0000 mg | INTRAVENOUS | Status: AC
  Administered 2016-06-01: 900 mg via INTRAVENOUS

## 2016-06-01 MED ORDER — HYDROMORPHONE HCL 4 MG PO TABS: 4.0000 mg | ORAL_TABLET | Freq: Four times a day (QID) | ORAL | 0 refills | Status: DC | PRN

## 2016-06-01 MED ORDER — CIPROFLOXACIN IN D5W 400 MG/200ML IV SOLN
400.0000 mg | INTRAVENOUS | Status: AC
  Administered 2016-06-01: 400 mg via INTRAVENOUS

## 2016-06-01 MED ORDER — CIPROFLOXACIN IN D5W 400 MG/200ML IV SOLN
INTRAVENOUS | Status: AC
  Filled 2016-06-01: qty 200

## 2016-06-01 MED ORDER — CLINDAMYCIN PHOSPHATE 900 MG/50ML IV SOLN
INTRAVENOUS | Status: AC
  Filled 2016-06-01: qty 50

## 2016-06-01 MED ORDER — SODIUM CHLORIDE 0.9 % IJ SOLN
INTRAMUSCULAR | Status: AC
  Filled 2016-06-01: qty 20

## 2016-06-01 MED ORDER — SODIUM CHLORIDE 0.9% FLUSH: 3.0000 mL | Freq: Two times a day (BID) | INTRAVENOUS | Status: DC

## 2016-06-01 MED ORDER — LIDOCAINE 2% (20 MG/ML) 5 ML SYRINGE
INTRAMUSCULAR | Status: DC | PRN
  Administered 2016-06-01: 80 mg via INTRAVENOUS

## 2016-06-01 MED ORDER — MORPHINE SULFATE (PF) 10 MG/ML IV SOLN: 2.0000 mg | INTRAVENOUS | Status: DC | PRN

## 2016-06-01 MED ORDER — SODIUM CHLORIDE 0.9% FLUSH: 3.0000 mL | INTRAVENOUS | Status: DC | PRN

## 2016-06-01 MED ORDER — SODIUM CHLORIDE 0.9 % IV SOLN: 250.0000 mL | INTRAVENOUS | Status: DC | PRN

## 2016-06-01 MED ORDER — MIDAZOLAM HCL 5 MG/5ML IJ SOLN
INTRAMUSCULAR | Status: DC | PRN
  Administered 2016-06-01: 2 mg via INTRAVENOUS

## 2016-06-01 MED ORDER — BUPIVACAINE LIPOSOME 1.3 % IJ SUSP
20.0000 mL | Freq: Once | INTRAMUSCULAR | Status: AC
  Administered 2016-06-01: 15 mL
  Filled 2016-06-01: qty 20

## 2016-06-01 MED ORDER — MEPERIDINE HCL 50 MG/ML IJ SOLN: 6.2500 mg | INTRAMUSCULAR | Status: DC | PRN

## 2016-06-01 MED ORDER — OXYCODONE HCL 5 MG PO TABS: 5.0000 mg | ORAL_TABLET | ORAL | Status: DC | PRN

## 2016-06-01 MED ORDER — ONDANSETRON HCL 4 MG/2ML IJ SOLN
INTRAMUSCULAR | Status: DC | PRN
  Administered 2016-06-01: 4 mg via INTRAVENOUS

## 2016-06-01 MED ORDER — BUPIVACAINE HCL 0.25 % IJ SOLN
INTRAMUSCULAR | Status: DC | PRN
  Administered 2016-06-01: 15 mL

## 2016-06-01 MED ORDER — 0.9 % SODIUM CHLORIDE (POUR BTL) OPTIME
TOPICAL | Status: DC | PRN
  Administered 2016-06-01: 1000 mL

## 2016-06-01 MED ORDER — PROPOFOL 10 MG/ML IV BOLUS
INTRAVENOUS | Status: AC
  Filled 2016-06-01: qty 20

## 2016-06-01 MED ORDER — HYDROMORPHONE HCL 1 MG/ML IJ SOLN: 0.2500 mg | INTRAMUSCULAR | Status: DC | PRN

## 2016-06-01 MED ORDER — MIDAZOLAM HCL 5 MG/ML IJ SOLN
0.5000 mg | INTRAMUSCULAR | Status: AC | PRN
  Administered 2016-06-01 (×2): 0.5 mg via INTRAVENOUS

## 2016-06-01 MED ORDER — SODIUM CHLORIDE 0.9 % IJ SOLN
INTRAMUSCULAR | Status: DC | PRN
  Administered 2016-06-01: 15 mL

## 2016-06-01 MED ORDER — PROMETHAZINE HCL 25 MG/ML IJ SOLN: 6.2500 mg | INTRAMUSCULAR | Status: DC | PRN

## 2016-06-01 MED ORDER — FENTANYL CITRATE (PF) 100 MCG/2ML IJ SOLN
INTRAMUSCULAR | Status: DC | PRN
  Administered 2016-06-01 (×4): 50 ug via INTRAVENOUS

## 2016-06-01 MED ORDER — BUPIVACAINE HCL (PF) 0.25 % IJ SOLN
INTRAMUSCULAR | Status: AC
  Filled 2016-06-01: qty 30

## 2016-06-01 SURGICAL SUPPLY — 39 items
ADH SKN CLS APL DERMABOND .7 (GAUZE/BANDAGES/DRESSINGS) ×1
BNDG GAUZE ELAST 4 BULKY (GAUZE/BANDAGES/DRESSINGS) ×2 IMPLANT
CHLORAPREP W/TINT 26ML (MISCELLANEOUS) ×2 IMPLANT
CLIP TI LARGE 6 (CLIP) IMPLANT
CLIP TI MEDIUM 6 (CLIP) ×2 IMPLANT
CLIP TI MEDIUM LARGE 6 (CLIP) IMPLANT
COVER SURGICAL LIGHT HANDLE (MISCELLANEOUS) ×2 IMPLANT
DERMABOND ADVANCED (GAUZE/BANDAGES/DRESSINGS) ×1
DERMABOND ADVANCED .7 DNX12 (GAUZE/BANDAGES/DRESSINGS) ×1 IMPLANT
DRAIN CHANNEL 10F 3/8 F FF (DRAIN) ×4 IMPLANT
DRAPE LEGGING IMPERMEABLE (DRAPES) ×2 IMPLANT
DRAPE SHEET LG 3/4 BI-LAMINATE (DRAPES) ×6 IMPLANT
DRAPE UTILITY XL STRL (DRAPES) ×2 IMPLANT
DRSG TEGADERM 4X4.75 (GAUZE/BANDAGES/DRESSINGS) ×8 IMPLANT
ELECT REM PT RETURN 9FT ADLT (ELECTROSURGICAL) ×2
ELECTRODE REM PT RTRN 9FT ADLT (ELECTROSURGICAL) ×1 IMPLANT
EVACUATOR SILICONE 100CC (DRAIN) ×4 IMPLANT
GLOVE BIO SURGEON STRL SZ 6 (GLOVE) ×2 IMPLANT
GLOVE BIO SURGEON STRL SZ 6.5 (GLOVE) ×4 IMPLANT
GOWN STRL REUS W/TWL LRG LVL3 (GOWN DISPOSABLE) ×4 IMPLANT
KIT BASIN OR (CUSTOM PROCEDURE TRAY) ×2 IMPLANT
LEGGING LITHOTOMY PAIR STRL (DRAPES) ×2 IMPLANT
NEEDLE HYPO 22GX1.5 SAFETY (NEEDLE) ×2 IMPLANT
PACK GENERAL/GYN (CUSTOM PROCEDURE TRAY) ×2 IMPLANT
SHEARS HARMONIC 9CM CVD (BLADE) ×2 IMPLANT
SPONGE DRAIN TRACH 4X4 STRL 2S (GAUZE/BANDAGES/DRESSINGS) ×4 IMPLANT
SUT ETHILON 3 0 PS 1 (SUTURE) ×4 IMPLANT
SUT MNCRL AB 4-0 PS2 18 (SUTURE) ×4 IMPLANT
SUT NYLON 3 0 (SUTURE) ×4 IMPLANT
SUT SILK 2 0 (SUTURE) ×2
SUT SILK 2-0 18XBRD TIE 12 (SUTURE) ×1 IMPLANT
SUT VIC AB 3-0 SH 18 (SUTURE) ×4 IMPLANT
SUT VIC AB 3-0 SH 27 (SUTURE) ×4
SUT VIC AB 3-0 SH 27X BRD (SUTURE) ×2 IMPLANT
SYR BULB IRRIGATION 50ML (SYRINGE) ×2 IMPLANT
SYR CONTROL 10ML LL (SYRINGE) ×2 IMPLANT
TOWEL OR 17X26 10 PK STRL BLUE (TOWEL DISPOSABLE) ×2 IMPLANT
TOWEL OR NON WOVEN STRL DISP B (DISPOSABLE) ×2 IMPLANT
TRAY FOLEY BAG SILVER LF 14FR (CATHETERS) IMPLANT

## 2016-06-01 NOTE — Anesthesia Postprocedure Evaluation (Signed)
Anesthesia Post Note  Patient: Melissa Alvarez  Procedure(s) Performed: Procedure(s) (LRB): RIGHT INGUNAL LYMPH NODE DISSECTION WITH A JP DRAIN (N/A)  Patient location during evaluation: PACU Anesthesia Type: General Level of consciousness: sedated and patient cooperative Pain management: pain level controlled Vital Signs Assessment: post-procedure vital signs reviewed and stable Respiratory status: spontaneous breathing Cardiovascular status: stable Anesthetic complications: no    Last Vitals:  Vitals:   06/01/16 1231 06/01/16 1308  BP: 116/77 130/79  Pulse: 81   Resp: 16   Temp: 36.6 C     Last Pain:  Vitals:   06/01/16 1308  TempSrc:   PainSc: 0-No pain                 Nolon Nations

## 2016-06-01 NOTE — H&P (View-Only) (Signed)
POSTOPERATIVE FOLLOWUP Assessment:    40 y.o. year old with Stage IB1 cervical cancer.   S/p modified radical hysterectomy with bilateral pelvic lymphadenectomy on 01/19/15.  Postop bladder dysfunction/urinary retention: decreased sense of urinary urge, and fecal incontinence. Postop right LE lymphdedema and left groin/lower abdomen lymphedema.  No evidence of cancer recurrence.  Plan: 1) stage IB1 cervical cancer. Recommend continued q 3 monthly surveillance visits until July 2018 (then 6 monthly) and annual vaginal cytology to screen for new HPV related disease. No evidence for recurrence at today's exam. 2) Urinary retention: counseled patient about regular voiding - every 3 hours, and positioning to aid in voiding. Discussed risks of permanent bladder dysfunction if she does not void regularly. 3)  Fecal incontinence: have refered patient to see Dr Maryland Pink for evaluation and possible pelvic floor muscle training. Recommended metamucil for stool bulking. 4) right inguinal adenopathy - benign on repeated biopsy, however, since there has been progression in size and the patient reports being very symptomatic from these, I offered right inguinolymphadenectomy to debulk the adenopathy. I explained that this was by no means a guaranteed cure of her symptoms, and there would be a high probability for reformation of nodularity and granulation tissue from scar. I also explained that it may make her Rt lower extremity lymphedema worse as we will be adding to the disruption of return of lymph from her leg. I discussed that her pain that is caused by edema and "puffiness" would be unchanged by this procedure and may be made worse. I discussed that I felt that the risks of her symptoms remaining stable or progressing to worse were substantially high if we perform this procedure, however there was a small possibility that her symptom of pain from the nodularity of the inguinal nodes would improve. This was  enough for the patient to decide that she wanted to pursue surgery. Her daughter, [name], expressed reservations which she voiced "it looks like this is a lose lose situation". I explained that it would not be my first recommendation as I think that the risk is high for non-resolution of symptoms or making them worse, however the patient felt strongly about proceeding with surgery.  I explained the surgery, its incision, the use of a postoperative drain and anticipated duration of drain and drain care. I explained risks including bleeding, infection, nerve injury, progressive lymphedema, wound separation. I explained that we would not be able to prescribe more than 6 weeks of postoperative analgesia. I explained that her tobacco abuse places her at increased risk for wound separation and infection. I encouraged her to cut back or stop preop.   5) lymphedema - continue PT (appreciate their input). I discussed with Secora that there is no surgical or medication intervention to reduce lymphedema. I discussed the chronic nature of the disorder. I explained that after an inguinal lymphadenectomy this will likely progress. She acknowledged that fact verbally. 5) chronic pain:  Have referred her to chronic pain clinic for chronic prescription opioid use (this predated her seeing me for her cervical cancer). However she was fired from practices due to positive tox screens. Have prescribed the patient 30tabs of short acting oral morphine. I explained the importance of quitting marajuana use in order to be eligible for care at a chronic pain clinic.  HPI:  Melissa Alvarez is a 40 y.o. year old G2P2000 initially seen in consultation on 11/24/14 for cervical cancer.  She then underwent a cold knife conization on [id number] without complications.  Her postoperative course was complicated by a trip to the ER for severe pain post cone. CT imaging showed no abscess or abnormality. Her white count was mildly elevated but she  was afebrile. She was discharged from the ER and I prescribed a course of doxy and flagl for presumed cervicitis. She continues to have pain postop op and so I repeated her CT imaging on 12/23/14 and this showed slightly more pelvic fluid, but no abscess. She has multiple fibroids, one of which appears to be degenerating.   Her final pathology from her cone revealed invasive moderately differentiated SCC measuring 1.100mm in greatest dimension with 1.52mm depth of invasion. There were multiple foci all less than 52mm. LVSI was present. 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 The post cone endocervical curretting was positive for fragments of squamous cell carcinoma.  On 01/19/15 she underwent robotic assisted type II modified radical hysterectomy, with RSO and bilateral pelvic lymphadenectomy. Pathology revealed CIS but no residual carcinoma and lymph nodes were negative. She did not meet criteria for needing adjuvant therapy.  Postop she experienced severe nausea and emesis and abdominal pain in the 1st week, and required readmission for supportive care, however she resolved symptoms within 24 hours. CT imaging was unremarkable.  She developed inguinal masses postoperatively (on the right). These were biopsied for benign findings (benign reactive lymph node with dermatopathologic changes) in August, 2016.  In December 2016 she reported anal leakage, and poor sensation of a full rectum. She needs to defecate 20 minutes after eating and notes her stools are looser than usual. She has had several "accidents". These symptoms have developed since surgery.  Postoperatively she developed fluctuating fullness in the right groin.The pain was very bothersome and limits quality of life and has caused multiple ED visits.   She had an MRI of the pelvis on 09/14/15 which showed no etiology for her leg pain and no lymphadenopathy. She was sent to PT who diagnosed her with RLE lymphedema and are actively treating her for  this. She was referred to pain clinics for her chronic pain use without an apparent active acute source, however she was fired when her tox screens were positive for Marijuana.   Interval Hx: She undewent US of the right groing on 03/28/16 which showed increase in prominent lymph nodes to 1.8cm. US guided biopsy on 04/05/16 showed benign spindle cell proliferation. She feels swelling intermittently in the right groin and lower abdomen, less so in the leg and thigh. She feels this is life affecting and deeply negatively impacts QOL. She reports that the swelling is bothersome, but it is the nodular lumps in the groin that are of particular concern and pain to her.  Current Outpatient Prescriptions on File Prior to Visit  Medication Sig Dispense Refill  . albuterol (ACCUNEB) 0.63 MG/3ML nebulizer solution Take 1 ampule by nebulization every 6 (six) hours as needed for Wheezing.    Marland Kitchen albuterol (PROVENTIL HFA;VENTOLIN HFA) 108 (90 Base) MCG/ACT inhaler Inhale 2 puffs into the lungs once. (Patient not taking: Reported on 05/08/2016) 1 Inhaler 1  . morphine (MSIR) 15 MG tablet Take 1 tablet (15 mg total) by mouth every 4 (four) hours as needed for severe pain. (Patient not taking: Reported on 05/08/2016) 30 tablet 0   No current facility-administered medications on file prior to visit.     Allergies  Allergen Reactions  . Other Hives and Anaphylaxis    Steroids. Can take solumedrol by IV Clinical steroids except for solu medrol  .  Acetaminophen Hives and Rash    With codeine States she can take percocet   . Erythromycin Hives, Itching and Rash  . Penicillins Hives, Itching and Rash    Has patient had a PCN reaction causing immediate rash, facial/tongue/throat swelling, SOB or lightheadedness with hypotension: Yes Has patient had a PCN reaction causing severe rash involving mucus membranes or skin necrosis: No Has patient had a PCN reaction that required hospitalization No Has patient had a PCN  reaction occurring within the last 10 years: No  If all of the above answers are "NO", then may proceed with Cephalosporin use.   Marland Kitchen Doxycycline Hives, Itching and Rash    Other reaction(s): Other (See Comments) Unsure  . Erythromycin Base Rash  . Motrin [Ibuprofen] Hives, Itching and Rash    Past Medical History:  Diagnosis Date  . Anemia   . Asthma   . Cancer ([place])    cervical  . COPD (chronic obstructive pulmonary disease) ([place])   . Difficulty sleeping   . Family history of adverse reaction to anesthesia    pts daughter awaken during surgery   . Gastric ulcer    x 5  . History of blood transfusion    last one 2 years ago   . Numbness and tingling    right hip area following June 2016 surgery pt states has improved   . Postoperative nausea and vomiting 01/21/2015  . Shortness of breath dyspnea    exertion    Past Surgical History:  Procedure Laterality Date  . APPENDECTOMY    . CERVICAL CONE BIOPSY     x 6  . CERVICAL CONIZATION W/BX N/A 12/08/2014   Procedure: COLD KNIFE CONIZATION OF CERVIX;  Surgeon: [name], MD;  Location: [facility];  Service: Gynecology;  Laterality: N/A;  . CESAREAN SECTION     x 2  . CHOLECYSTECTOMY    . DILATION AND CURETTAGE OF UTERUS    . PELVIC LYMPH NODE DISSECTION N/A 01/19/2015   Procedure: PELVIC LYMPH NODE DISSECTION;  Surgeon: [name], MD;  Location: [facility];  Service: Gynecology;  Laterality: N/A;  . ROBOTIC ASSISTED TOTAL HYSTERECTOMY N/A 01/19/2015   Procedure: ROBOTIC ASSISTED RADICAL HYSTERECTOMY RIGHT TUBE AND OVARY, LEFT TUBE;  Surgeon: [name], MD;  Location: [facility];  Service: Gynecology;  Laterality: N/A;  . SHOULDER SURGERY Left   . TONSILLECTOMY     Family History  Problem Relation Age of Onset  . Cancer Mother   . Hypertension Mother   . Diabetes Mother   . CAD Mother   . Diabetes Other   . Hypertension Other   . Cancer Other   . CAD Other    Social History   Social History  . Marital status: Single     Spouse name: N/A  . Number of children: N/A  . Years of education: N/A   Occupational History  . Not on file.   Social History Main Topics  . Smoking status: Current Every Day Smoker    Packs/day: 3.00    Years: 24.00    Types: Cigarettes  . Smokeless tobacco: Never Used  . Alcohol use No     Comment: past hx of occas use   . Drug use:     Types: Marijuana     Comment: not currently  . Sexual activity: No   Other Topics Concern  . Not on file   Social History Narrative  . No narrative on file     Review of  systems: Constitutional:  She has no weight gain or weight loss. She has no fever or chills. Eyes: No blurred vision Ears, Nose, Mouth, Throat: No dizziness, headaches or changes in hearing. No mouth sores. Cardiovascular: No chest pain, palpitations or edema. Respiratory:  No shortness of breath, wheezing or cough Gastrointestinal: She has normal bowel movements without diarrhea or constipation. She denies any nausea or vomiting. She denies blood in her stool or heart burn. + fecal incontinence Genitourinary:  + groin pain, +bleeding Musculoskeletal: Denies muscle weakness or joint pains.  Skin:  She has no skin changes, rashes or itching Neurological:  No headache Psychiatric:  She denies depression or anxiety. Hematologic/Lymphatic:   No easy bruising or bleeding   Physical Exam: Blood pressure (!) 146/89, pulse 73, temperature 99 F (37.2 C), temperature source Oral, resp. rate 18, weight 196 lb 6.4 oz (89.1 kg), last menstrual period 12/08/2014, SpO2 100 %. General: Well dressed, well nourished in no apparent distress.   HEENT:  Normocephalic and atraumatic, no lesions.  Extraocular muscles intact. Sclerae anicteric. Pupils equal, round, reactive. No mouth sores or  Abdomen:  Soft, nontender, nondistended.  No palpable masses.  No hepatosplenomegaly.  No ascites. Normal bowel sounds.  No hernias.  Well healed incisions. Genitourinary: Normal EGBUS  Vaginal cuff  in tact with no abnormal drainage. Healednormally, no masses or lesions. Rectal: grossly normal tone. In tact sphincter palpably. No masses. Extremities: Mild right LE edema compared to left. There is right sided mons pubis and inguinal and lower abdominal lymphedema. Psychiatric: Mood and affect are appropriate. Neurological: Awake, alert and oriented x 3. Sensation is intact, no neuropathy.  Musculoskeletal: No pain, normal strength and range of motion. Lymph nodes: right inguinal region has palpable tender nodes that are most prominent medial in approximation to pubic tubercle. These measure approximately 2cm.  No cervical adenopathy.  [name], MD

## 2016-06-01 NOTE — Discharge Instructions (Signed)
General Anesthesia, Adult, Care After These instructions provide you with information about caring for yourself after your procedure. Your health care provider may also give you more specific instructions. Your treatment has been planned according to current medical practices, but problems sometimes occur. Call your health care provider if you have any problems or questions after your procedure. What can I expect after the procedure? After the procedure, it is common to have:  Vomiting.  A sore throat.  Mental slowness. It is common to feel:  Nauseous.  Cold or shivery.  Sleepy.  Tired.  Sore or achy, even in parts of your body where you did not have surgery. Follow these instructions at home: For at least 24 hours after the procedure:  Do not:  Participate in activities where you could fall or become injured.  Drive.  Use heavy machinery.  Drink alcohol.  Take sleeping pills or medicines that cause drowsiness.  Make important decisions or sign legal documents.  Take care of children on your own.  Rest. Eating and drinking  If you vomit, drink water, juice, or soup when you can drink without vomiting.  Drink enough fluid to keep your urine clear or pale yellow.  Make sure you have little or no nausea before eating solid foods.  Follow the diet recommended by your health care provider. General instructions  Have a responsible adult stay with you until you are awake and alert.  Return to your normal activities as told by your health care provider. Ask your health care provider what activities are safe for you.  Take over-the-counter and prescription medicines only as told by your health care provider.  If you smoke, do not smoke without supervision.  Keep all follow-up visits as told by your health care provider. This is important. Contact a health care provider if:  You continue to have nausea or vomiting at home, and medicines are not helpful.  You  cannot drink fluids or start eating again.  You cannot urinate after 8-12 hours.  You develop a skin rash.  You have fever.  You have increasing redness at the site of your procedure. Get help right away if:  You have difficulty breathing.  You have chest pain.  You have unexpected bleeding.  You feel that you are having a life-threatening or urgent problem. This information is not intended to replace advice given to you by your health care provider. Make sure you discuss any questions you have with your health care provider. Document Released: 09/25/2000 Document Revised: 11/22/2015 Document Reviewed: 06/03/2015 Elsevier Interactive Patient Education  2017 [place] right ovary was removed as part of your hysterectomy in 2016, however you still have your left ovary in place.  Lymphadenectomy, Care After  ACTIVITY  Rest as much as possible the first two weeks after discharge.  Arrange to have help from family or others with your daily activities when you go home.  Avoid heavy lifting (more than 5 pounds), pushing, or pulling.  If you feel tired, balance your activity with rest periods.  Follow your caregiver's instruction about climbing stairs and driving a car. Do not attempt to drive a car for at least 48 hours  Increase activity gradually.  Do not exercise until you have permission from your caregiver.  LEG AND FOOT CARE If your doctor has removed lymph nodes from your groin area, there may be an increase in swelling of your legs and feet. You can help prevent swelling by doing the following:  Elevate  your legs while sitting or lying down.  If your caregiver has ordered special stockings, wear them according to instructions.  Avoid standing in one place for long periods of time.  Call the physical therapy department if you have any questions about swelling or treatment for swelling.  Avoid salt in your diet. It can cause fluid retention and  swelling.  Do not cross your legs, especially when sitting.  NUTRITION  You may resume your normal diet.  Drink 6 to 8 glasses of fluids a day.  Eat a healthy, balanced diet including portions of food from the meat (protein), milk, fruit, vegetable, and bread groups.  Your caregiver may recommend you take a multivitamin with iron.  HOME CARE INSTRUCTIONS   Take your temperature twice a day and record it, especially if you feel feverish or have chills.  Follow your caregiver's instructions about medicines, activity, and follow-up appointments after surgery.  Do not drink alcohol while taking pain medicine.  Change your dressing as advised by your caregiver.  You may take over-the-counter medicine for pain, recommended by your caregiver.  If your pain is not relieved with medicine, call your caregiver.  Do not take aspirin because it can cause bleeding.  Do not have sexual intercourse until your caregiver gives you permission (typically 6 weeks postoperatively). Hugging, kissing, and playful sexual activity is fine with your caregiver's permission.  Take showers instead of baths, until your caregiver gives you permission to take baths.  You may take a mild medicine for constipation, recommended by your caregiver. Bran foods and drinking a lot of fluids will help with constipation.  Make sure your family understands everything about your operation and recovery. SEEK MEDICAL CARE IF:   You notice swelling and redness around the wound area.  You notice a foul smell coming from the wound or on the surgical dressing.  You notice the wound is separating.  You have painful or bloody urination.  You develop nausea and vomiting.  You develop diarrhea.  You develop a rash.  You have a reaction or allergy from the medicine.  You feel dizzy or light-headed.  You need stronger pain medicine. SEEK IMMEDIATE MEDICAL CARE IF:   You develop a temperature of 102 F (38.9 C)  or higher.  You pass out.  You develop leg or chest pain.  You develop abdominal pain.  You develop shortness of breath.  You develop bleeding from the wound area.  You see pus in the wound area. MAKE SURE YOU:   Understand these instructions.  Will watch your condition.  Will get help right away if you are not doing well or get worse. Document Released: 02/01/2004 Document Revised: 11/03/2013 Document Reviewed: 05/21/2009 [facility] Patient Information 2015 [place], [place]. This information is not intended to replace advice given to you by your health care provider. Make sure you discuss any questions you have with your health care provider.

## 2016-06-01 NOTE — Anesthesia Procedure Notes (Signed)
Procedure Name: LMA Insertion Performed by: Gean Maidens Pre-anesthesia Checklist: Patient identified, Emergency Drugs available, Suction available, Patient being monitored and Timeout performed Patient Re-evaluated:Patient Re-evaluated prior to inductionOxygen Delivery Method: Circle system utilized Preoxygenation: Pre-oxygenation with 100% oxygen Intubation Type: IV induction Ventilation: Mask ventilation without difficulty LMA: LMA inserted LMA Size: 4.0 Number of attempts: 1 Placement Confirmation: ETT inserted through vocal cords under direct vision,  positive ETCO2,  CO2 detector and breath sounds checked- equal and bilateral Tube secured with: Tape Dental Injury: Teeth and Oropharynx as per pre-operative assessment

## 2016-06-01 NOTE — Anesthesia Preprocedure Evaluation (Addendum)
Anesthesia Evaluation  Patient identified by MRN, date of birth, ID band Patient awake    Reviewed: Allergy & Precautions, NPO status , Patient's Chart, lab work & pertinent test results  History of Anesthesia Complications (+) PONV and history of anesthetic complications  Airway Mallampati: II  TM Distance: >3 FB Neck ROM: Full    Dental   Pulmonary shortness of breath, asthma , COPD, Current Smoker,    breath sounds clear to auscultation       Cardiovascular negative cardio ROS   Rhythm:Regular Rate:Normal     Neuro/Psych PSYCHIATRIC DISORDERS Depression    GI/Hepatic Neg liver ROS, PUD,   Endo/Other  negative endocrine ROS  Renal/GU negative Renal ROS     Musculoskeletal   Abdominal (+) + obese,   Peds  Hematology  (+) anemia ,   Anesthesia Other Findings   Reproductive/Obstetrics                             Anesthesia Physical  Anesthesia Plan  ASA: III  Anesthesia Plan: General   Post-op Pain Management:    Induction: Intravenous  Airway Management Planned: LMA  Additional Equipment:   Intra-op Plan:   Post-operative Plan: Extubation in OR  Informed Consent: I have reviewed the patients History and Physical, chart, labs and discussed the procedure including the risks, benefits and alternatives for the proposed anesthesia with the patient or authorized representative who has indicated his/her understanding and acceptance.   Dental advisory given  Plan Discussed with: CRNA and Anesthesiologist  Anesthesia Plan Comments:        Anesthesia Quick Evaluation

## 2016-06-01 NOTE — Op Note (Signed)
OPERATIVE NOTE 06/01/16  Preoperative Diagnosis: right inguinal masses, inguinal pain, history of cervical cancer, right LE lymphedema.   Postoperative Diagnosis:same    Procedure(s) Performed: Right inguinal lymphadenectomy.  Surgeon: Thereasa Solo, MD.  Assistant Surgeon: Lahoma Crocker, M.D. Assistant: (an MD assistant was necessary for tissue manipulation, retraction and positioning due to the complexity of the case and hospital policies).   Specimens: right inguinal lymphadenectomy   Estimated Blood Loss: <20 mL.    Urine Output: [id number]  Complications: None.   Operative Findings: 5 prominent 1-2cm lymph nodes in right inguinal space. No gross malignancy.    Procedure:   The patient was seen in the Holding Room. The risks, benefits, complications, treatment options, and expected outcomes were discussed with the patient.  The patient concurred with the proposed plan, giving informed consent.   The patient was  identified as Melissa Alvarez  and the procedure verified as right inguinal lymphadenectomy. A Time Out was held and the above information confirmed upon entry to the operating room..  After induction of anesthesia, the patient was draped and prepped in the usual sterile manner.  She was prepped and draped in the normal sterile fashion in supine position with good attention paid to support of the lower back and lower extremities. Prior to this the foley was inserted into the bladder in a sterile fashion. Position was adjusted for appropriate support.    The right inguino-femoral lymphadenectomy was performed. The bony landmarks of the pubic tubercle and the ASIS were identified and the palpable location of the femoral artery. A 6cm incision was made 1 fingerbreadth below the inguinal ligament on the right anterior thigh/groin parallel to the inguinal ligament. The camper's fascia was scored and the inferior and superior skin flaps were created with elevation of skin hooks and  use of the bovie for dissection. The inguino-femoral lymph nodes were circumscribed in the operative bed. Palpably enlarged or prominent lymph node tissue was grasped and using bovie and harmonic dissection by separating its attachments to the level of the cribiform facia overlying the femoral vessels. Care was taken to preserve palpably normal lymphatic tissue. The great saphenous vein was identified and skeletonized during this dissection and spared. A stab wound was made with an 11 blade scalpel lateral and inferior to the incision and a 15 french JP drain was placed in the bed of the groin bed. It was secured with nylon. The camper's fascia was closed with running 3-0 vicryl overlying the drain. The skin was closed with running 4-0 monocryl and dermabond. Exparel and marcaine was infiltrated into the incision.  Sponge, lap and needle counts were correct x 2.   The patient was transferred in a stable condition and foley was removed.  [name], MD

## 2016-06-01 NOTE — Interval H&P Note (Signed)
History and Physical Interval Note:  06/01/2016 9:37 AM  Melissa Alvarez  has presented today for surgery, with the diagnosis of RIGHT INGUNAL ADENOPATHY  The various methods of treatment have been discussed with the patient and family. After consideration of risks, benefits and other options for treatment, the patient has consented to  Procedure(s): RIGHT INGUNAL LYMPH NODE DISSECTION WITH A JP DRAIN (N/A) as a surgical intervention .  The patient's history has been reviewed, patient examined, no change in status, stable for surgery.  I have reviewed the patient's chart and labs.  Questions were answered to the patient's satisfaction.     [name]

## 2016-06-01 NOTE — Transfer of Care (Signed)
Immediate Anesthesia Transfer of Care Note  Patient: Melissa Alvarez  Procedure(s) Performed: Procedure(s): RIGHT INGUNAL LYMPH NODE DISSECTION WITH A JP DRAIN (N/A)  Patient Location: PACU  Anesthesia Type:General  Level of Consciousness: awake, alert  and oriented  Airway & Oxygen Therapy: Patient Spontanous Breathing and Patient connected to face mask oxygen  Post-op Assessment: Report given to RN and Post -op Vital signs reviewed and stable  Post vital signs: Reviewed and stable  Last Vitals:  Vitals:   06/01/16 0958 06/01/16 0959  BP:    Pulse: 85 93  Resp:    Temp:      Last Pain:  Vitals:   06/01/16 0816  TempSrc:   PainSc: 7       Patients Stated Pain Goal: 4 ([id number])  Complications: No apparent anesthesia complications

## 2016-06-02 ENCOUNTER — Encounter (HOSPITAL_COMMUNITY): Payer: Self-pay | Admitting: Gynecologic Oncology

## 2016-06-06 ENCOUNTER — Telehealth: Payer: Self-pay

## 2016-06-06 ENCOUNTER — Encounter: Payer: Self-pay | Admitting: Gynecologic Oncology

## 2016-06-06 NOTE — Telephone Encounter (Signed)
Pt s/p right inguinal lymphadenectomy, had JP drain placed. Pt called stated "tube on drain is coming away from my body" RN informed pt that drain was attached with nylon suture, pt refused to look at surgical site to inform RN if suture was still intact. Pt stated " I am on my way there now" RN informed pt that there wasn't a practitioner in the office at this time, she was in surgery. RN insisted that patient wait 15-67min for call back from [facility], otherwise she would need to go to ER since we did not have practitioner here. Pt was irrational and angry and stated "I'm on my way there now, someone will see me!" RN again, informed patient she would need to go to ER, until a practitioner is available in the office. Pt became frustrated and hung up on RN. Writer immediately paged and spoke with Lenord Carbo
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
## 2016-06-07 ENCOUNTER — Encounter: Payer: Self-pay | Admitting: Gynecologic Oncology

## 2016-06-07 ENCOUNTER — Ambulatory Visit: Payer: Medicare Other | Attending: Gynecologic Oncology | Admitting: Gynecologic Oncology

## 2016-06-07 VITALS — BP 131/81 | HR 67 | Temp 98.3°F | Resp 18 | Ht 66.5 in | Wt 197.3 lb

## 2016-06-07 DIAGNOSIS — F17210 Nicotine dependence, cigarettes, uncomplicated: Secondary | ICD-10-CM | POA: Insufficient documentation

## 2016-06-07 DIAGNOSIS — Z885 Allergy status to narcotic agent status: Secondary | ICD-10-CM | POA: Insufficient documentation

## 2016-06-07 DIAGNOSIS — Z8719 Personal history of other diseases of the digestive system: Secondary | ICD-10-CM | POA: Insufficient documentation

## 2016-06-07 DIAGNOSIS — Z809 Family history of malignant neoplasm, unspecified: Secondary | ICD-10-CM | POA: Diagnosis not present

## 2016-06-07 DIAGNOSIS — D259 Leiomyoma of uterus, unspecified: Secondary | ICD-10-CM | POA: Diagnosis not present

## 2016-06-07 DIAGNOSIS — Z881 Allergy status to other antibiotic agents status: Secondary | ICD-10-CM | POA: Insufficient documentation

## 2016-06-07 DIAGNOSIS — Z880 Allergy status to penicillin: Secondary | ICD-10-CM | POA: Insufficient documentation

## 2016-06-07 DIAGNOSIS — J449 Chronic obstructive pulmonary disease, unspecified: Secondary | ICD-10-CM | POA: Diagnosis not present

## 2016-06-07 DIAGNOSIS — Z9889 Other specified postprocedural states: Secondary | ICD-10-CM | POA: Diagnosis not present

## 2016-06-07 DIAGNOSIS — Z8541 Personal history of malignant neoplasm of cervix uteri: Secondary | ICD-10-CM

## 2016-06-07 DIAGNOSIS — Z886 Allergy status to analgesic agent status: Secondary | ICD-10-CM | POA: Diagnosis not present

## 2016-06-07 DIAGNOSIS — C539 Malignant neoplasm of cervix uteri, unspecified: Secondary | ICD-10-CM | POA: Insufficient documentation

## 2016-06-07 DIAGNOSIS — F329 Major depressive disorder, single episode, unspecified: Secondary | ICD-10-CM | POA: Diagnosis not present

## 2016-06-07 DIAGNOSIS — R590 Localized enlarged lymph nodes: Secondary | ICD-10-CM | POA: Diagnosis not present

## 2016-06-07 DIAGNOSIS — R159 Full incontinence of feces: Secondary | ICD-10-CM

## 2016-06-07 DIAGNOSIS — G8929 Other chronic pain: Secondary | ICD-10-CM

## 2016-06-07 DIAGNOSIS — Z9049 Acquired absence of other specified parts of digestive tract: Secondary | ICD-10-CM | POA: Diagnosis not present

## 2016-06-07 DIAGNOSIS — Z833 Family history of diabetes mellitus: Secondary | ICD-10-CM | POA: Diagnosis not present

## 2016-06-07 DIAGNOSIS — Z8249 Family history of ischemic heart disease and other diseases of the circulatory system: Secondary | ICD-10-CM | POA: Insufficient documentation

## 2016-06-07 DIAGNOSIS — I890 Lymphedema, not elsewhere classified: Secondary | ICD-10-CM | POA: Diagnosis not present

## 2016-06-07 DIAGNOSIS — R1909 Other intra-abdominal and pelvic swelling, mass and lump: Secondary | ICD-10-CM

## 2016-06-07 DIAGNOSIS — R339 Retention of urine, unspecified: Secondary | ICD-10-CM | POA: Diagnosis not present

## 2016-06-07 DIAGNOSIS — Z90710 Acquired absence of both cervix and uterus: Secondary | ICD-10-CM | POA: Diagnosis not present

## 2016-06-07 DIAGNOSIS — Z888 Allergy status to other drugs, medicaments and biological substances status: Secondary | ICD-10-CM | POA: Diagnosis not present

## 2016-06-07 MED ORDER — HYDROMORPHONE HCL 4 MG PO TABS: 4.0000 mg | ORAL_TABLET | Freq: Four times a day (QID) | ORAL | 0 refills | Status: DC | PRN
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
## 2016-06-13 NOTE — Progress Notes (Signed)
POSTOPERATIVE FOLLOWUP Assessment:    40 y.o. year old with Stage IB1 cervical cancer.   S/p modified radical hysterectomy with bilateral pelvic lymphadenectomy on 01/19/15.  Postop bladder dysfunction/urinary retention: decreased sense of urinary urge, and fecal incontinence. Postop right LE lymphdedema and left groin/lower abdomen lymphedema.  S/p right inguinal lymphadenectomy for symptomatic lymphadenopathy on 06/01/16. Single episode of vaginal spotting on 06/01/16   No evidence of cancer recurrence.  Plan: 1) stage IB1 cervical cancer. Recommend continued q 3 monthly surveillance visits until July 2018 (then 6 monthly) and annual vaginal cytology to screen for new HPV related disease.  2) Urinary retention: counseled patient about regular voiding - every 3 hours, and positioning to aid in voiding. Discussed risks of permanent bladder dysfunction if she does not void regularly. 3)  Fecal incontinence: have refered patient to see Dr Maryland Pink for evaluation and possible pelvic floor muscle training. Recommended metamucil for stool bulking. 4) right inguinal adenopathy - s/p right inguinal lymphadenectomy 06/01/16. Benign pathology (vascular proliferation).  Healing normally. Drain pulled today.  Patient will notify us of increasing fullness in the groin (suggestive of recollection of lymph fluid). Discussed that if this occurs she may require percutaneous drainage.  5) lymphedema - continue PT (appreciate their input). I discussed with Dorsey that there is no surgical or medication intervention to reduce lymphedema. I discussed the chronic nature of the disorder. I explained that after an inguinal lymphadenectomy this will likely progress. She acknowledged that fact verbally. 6) chronic pain:  Have referred her to chronic pain clinic for chronic prescription opioid use (this predated her seeing me for her cervical cancer). However she was fired from practices due to positive tox  screens. Have prescribed the patient 30tabs of short acting oral morphine. I explained the importance of quitting marajuana use in order to be eligible for care at a chronic pain clinic. Will continue to refill only until 6 weeks postop.  Follow-up 1 month.  HPI:  Melissa Alvarez is a 40 y.o. year old G2P2000 initially seen in consultation on 11/24/14 for cervical cancer.  She then underwent a cold knife conization on [id number] without complications.  Her postoperative course was complicated by a trip to the ER for severe pain post cone. CT imaging showed no abscess or abnormality. Her white count was mildly elevated but she was afebrile. She was discharged from the ER and I prescribed a course of doxy and flagl for presumed cervicitis. She continues to have pain postop op and so I repeated her CT imaging on 12/23/14 and this showed slightly more pelvic fluid, but no abscess. She has multiple fibroids, one of which appears to be degenerating.   Her final pathology from her cone revealed invasive moderately differentiated SCC measuring 1.62mm in greatest dimension with 1.27mm depth of invasion. There were multiple foci all less than 47mm. LVSI was present. 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 The post cone endocervical curretting was positive for fragments of squamous cell carcinoma.  On 01/19/15 she underwent robotic assisted type II modified radical hysterectomy, with RSO and bilateral pelvic lymphadenectomy. Pathology revealed CIS but no residual carcinoma and lymph nodes were negative. She did not meet criteria for needing adjuvant therapy.  Postop she experienced severe nausea and emesis and abdominal pain in the 1st week, and required readmission for supportive care, however she resolved symptoms within 24 hours. CT imaging was unremarkable.  She developed inguinal masses postoperatively (on the right). These were biopsied for benign findings (benign reactive lymph  node with dermatopathologic changes) in  August, 2016.  In December 2016 she reported anal leakage, and poor sensation of a full rectum. She needs to defecate 20 minutes after eating and notes her stools are looser than usual. She has had several "accidents". These symptoms have developed since surgery.  Postoperatively she developed fluctuating fullness in the right groin.The pain was very bothersome and limits quality of life and has caused multiple ED visits.   She had an MRI of the pelvis on 09/14/15 which showed no etiology for her leg pain and no lymphadenopathy. She was sent to PT who diagnosed her with RLE lymphedema and are actively treating her for this. She was referred to pain clinics for her chronic pain use without an apparent active acute source, however she was fired when her tox screens were positive for Marijuana.   She undewent US of the right groing on 03/28/16 which showed increase in prominent lymph nodes to 1.8cm. US guided biopsy on 04/05/16 showed benign spindle cell proliferation. She felt swelling intermittently in the right groin and lower abdomen, less so in the leg and thigh which was affecting and deeply negatively impacted QOL.  INTERVAL HX: On 06/01/16 she underwent right inguinal lymphadenectomy. Pathology revealed benign focal vascular proliferation in 6 lymph nodes.  Postoperatively she has done well. 10 cc per day of drainage. Mild right thigh welling.    Current Outpatient Prescriptions on File Prior to Visit  Medication Sig Dispense Refill  . albuterol (ACCUNEB) 0.63 MG/3ML nebulizer solution Take 1 ampule by nebulization every 6 (six) hours as needed for Wheezing.    Marland Kitchen albuterol (PROVENTIL HFA;VENTOLIN HFA) 108 (90 Base) MCG/ACT inhaler Inhale 2 puffs into the lungs once. 1 Inhaler 1  . HYDROmorphone (DILAUDID) 4 MG tablet Take 1 tablet (4 mg total) by mouth every 6 (six) hours as needed for severe pain. 30 tablet 0   No current facility-administered medications on file prior to visit.      Allergies  Allergen Reactions  . Other Hives and Anaphylaxis    Steroids. Can take solumedrol by IV Clinical steroids except for solu medrol  . Acetaminophen Hives and Rash    With codeine States she can take percocet   . Erythromycin Hives, Itching and Rash  . Penicillins Hives, Itching and Rash    Has patient had a PCN reaction causing immediate rash, facial/tongue/throat swelling, SOB or lightheadedness with hypotension: Yes Has patient had a PCN reaction causing severe rash involving mucus membranes or skin necrosis: No Has patient had a PCN reaction that required hospitalization No Has patient had a PCN reaction occurring within the last 10 years: No  If all of the above answers are "NO", then may proceed with Cephalosporin use.   Marland Kitchen Doxycycline Hives, Itching and Rash    Other reaction(s): Other (See Comments) Unsure  . Erythromycin Base Rash  . Motrin [Ibuprofen] Hives, Itching and Rash    Past Medical History:  Diagnosis Date  . Anemia   . Asthma   . Cancer ([place])    cervical  . COPD (chronic obstructive pulmonary disease) ([place])   . Depression   . Difficulty sleeping   . Difficulty sleeping   . Family history of adverse reaction to anesthesia    pts daughter and pts mother awaken during surgery;   Marland Kitchen Gastric ulcer    x 5  . History of blood transfusion    last one 2 years ago   . Numbness and tingling  right hip area following June 2016 surgery pt states has improved   . Postoperative nausea and vomiting 01/21/2015  . Shortness of breath dyspnea    exertion    Past Surgical History:  Procedure Laterality Date  . ABDOMINAL HYSTERECTOMY    . APPENDECTOMY    . CERVICAL CONE BIOPSY     x 6  . CERVICAL CONIZATION W/BX N/A 12/08/2014   Procedure: COLD KNIFE CONIZATION OF CERVIX;  Surgeon: [name], MD;  Location: [facility];  Service: Gynecology;  Laterality: N/A;  . CESAREAN SECTION     x 2  . CHOLECYSTECTOMY    . DILATION AND CURETTAGE OF UTERUS    .  LYMPH NODE DISSECTION N/A 06/01/2016   Procedure: RIGHT INGUNAL LYMPH NODE DISSECTION WITH A JP DRAIN;  Surgeon: [name], MD;  Location: [facility];  Service: Gynecology;  Laterality: N/A;  . PELVIC LYMPH NODE DISSECTION N/A 01/19/2015   Procedure: PELVIC LYMPH NODE DISSECTION;  Surgeon: [name], MD;  Location: [facility];  Service: Gynecology;  Laterality: N/A;  . ROBOTIC ASSISTED TOTAL HYSTERECTOMY N/A 01/19/2015   Procedure: ROBOTIC ASSISTED RADICAL HYSTERECTOMY RIGHT TUBE AND OVARY, LEFT TUBE;  Surgeon: [name], MD;  Location: [facility];  Service: Gynecology;  Laterality: N/A;  . SHOULDER SURGERY Left   . TONSILLECTOMY     Family History  Problem Relation Age of Onset  . Cancer Mother   . Hypertension Mother   . Diabetes Mother   . CAD Mother   . Diabetes Other   . Hypertension Other   . Cancer Other   . CAD Other    Social History   Social History  . Marital status: Single    Spouse name: N/A  . Number of children: N/A  . Years of education: N/A   Occupational History  . Not on file.   Social History Main Topics  . Smoking status: Current Every Day Smoker    Packs/day: 3.00    Years: 24.00    Types: Cigarettes  . Smokeless tobacco: Never Used  . Alcohol use No     Comment: past hx of occas use   . Drug use:     Types: Marijuana     Comment: last use 05/02/2016  . Sexual activity: No   Other Topics Concern  . Not on file   Social History Narrative  . No narrative on file     Review of systems: Constitutional:  She has no weight gain or weight loss. She has no fever or chills. Eyes: No blurred vision Ears, Nose, Mouth, Throat: No dizziness, headaches or changes in hearing. No mouth sores. Cardiovascular: No chest pain, palpitations or edema. Respiratory:  No shortness of breath, wheezing or cough Gastrointestinal: She has normal bowel movements without diarrhea or constipation. She denies any nausea or vomiting. She denies blood in her stool or heart burn. +  fecal incontinence Genitourinary:  + groin pain, +bleeding Musculoskeletal: Denies muscle weakness or joint pains.  Skin:  She has no skin changes, rashes or itching Neurological:  No headache Psychiatric:  She denies depression or anxiety. Hematologic/Lymphatic:   No easy bruising or bleeding   Physical Exam: Last menstrual period 12/08/2014. General: Well dressed, well nourished in no apparent distress.   HEENT:  Normocephalic and atraumatic, no lesions.  Extraocular muscles intact. Sclerae anicteric. Pupils equal, round, reactive. No mouth sores or  Abdomen:  Soft, nontender, nondistended.  No palpable masses.  No hepatosplenomegaly.  No ascites. Normal bowel sounds.  No hernias.  Well healed incisions. Genitourinary: deferred Rectal: deferred Extremities: Mild right LE edema compared to left. There is right sided mons pubis and inguinal and lower abdominal lymphedema. Psychiatric: Mood and affect are appropriate. Neurological: Awake, alert and oriented x 3. Sensation is intact, no neuropathy.  Musculoskeletal: No pain, normal strength and range of motion. Lymph nodes: Rt groin incision healing well with no masses. There is induration immediately superior to the incision consistent with scar reaction, non-fluctuant, no incisional erythema or drainage. Drain with 10cc of serosanguinous fluid. JP removed  [name], MD

## 2016-06-14 ENCOUNTER — Encounter: Payer: Self-pay | Admitting: Gynecologic Oncology

## 2016-06-14 ENCOUNTER — Ambulatory Visit: Payer: Medicare Other | Admitting: Gynecologic Oncology

## 2016-06-14 ENCOUNTER — Ambulatory Visit: Payer: Medicare Other | Attending: Gynecologic Oncology | Admitting: Gynecologic Oncology

## 2016-06-14 VITALS — BP 149/82 | HR 84 | Temp 98.9°F | Resp 18 | Ht 66.5 in | Wt 197.2 lb

## 2016-06-14 DIAGNOSIS — D259 Leiomyoma of uterus, unspecified: Secondary | ICD-10-CM | POA: Insufficient documentation

## 2016-06-14 DIAGNOSIS — Z8249 Family history of ischemic heart disease and other diseases of the circulatory system: Secondary | ICD-10-CM | POA: Diagnosis not present

## 2016-06-14 DIAGNOSIS — Z9049 Acquired absence of other specified parts of digestive tract: Secondary | ICD-10-CM | POA: Diagnosis not present

## 2016-06-14 DIAGNOSIS — G8929 Other chronic pain: Secondary | ICD-10-CM | POA: Insufficient documentation

## 2016-06-14 DIAGNOSIS — F329 Major depressive disorder, single episode, unspecified: Secondary | ICD-10-CM | POA: Insufficient documentation

## 2016-06-14 DIAGNOSIS — D649 Anemia, unspecified: Secondary | ICD-10-CM | POA: Diagnosis not present

## 2016-06-14 DIAGNOSIS — I890 Lymphedema, not elsewhere classified: Secondary | ICD-10-CM | POA: Diagnosis not present

## 2016-06-14 DIAGNOSIS — F17210 Nicotine dependence, cigarettes, uncomplicated: Secondary | ICD-10-CM | POA: Insufficient documentation

## 2016-06-14 DIAGNOSIS — Z881 Allergy status to other antibiotic agents status: Secondary | ICD-10-CM | POA: Diagnosis not present

## 2016-06-14 DIAGNOSIS — Z90710 Acquired absence of both cervix and uterus: Secondary | ICD-10-CM | POA: Diagnosis not present

## 2016-06-14 DIAGNOSIS — Z880 Allergy status to penicillin: Secondary | ICD-10-CM | POA: Insufficient documentation

## 2016-06-14 DIAGNOSIS — C539 Malignant neoplasm of cervix uteri, unspecified: Secondary | ICD-10-CM | POA: Insufficient documentation

## 2016-06-14 DIAGNOSIS — Z8541 Personal history of malignant neoplasm of cervix uteri: Secondary | ICD-10-CM

## 2016-06-14 DIAGNOSIS — J449 Chronic obstructive pulmonary disease, unspecified: Secondary | ICD-10-CM | POA: Diagnosis not present

## 2016-06-14 DIAGNOSIS — Z886 Allergy status to analgesic agent status: Secondary | ICD-10-CM | POA: Diagnosis not present

## 2016-06-14 DIAGNOSIS — Z888 Allergy status to other drugs, medicaments and biological substances status: Secondary | ICD-10-CM | POA: Insufficient documentation

## 2016-06-14 DIAGNOSIS — Z79891 Long term (current) use of opiate analgesic: Secondary | ICD-10-CM | POA: Insufficient documentation

## 2016-06-14 DIAGNOSIS — Z8719 Personal history of other diseases of the digestive system: Secondary | ICD-10-CM | POA: Insufficient documentation

## 2016-06-14 DIAGNOSIS — Z9889 Other specified postprocedural states: Secondary | ICD-10-CM | POA: Diagnosis not present

## 2016-06-14 DIAGNOSIS — Z4803 Encounter for change or removal of drains: Secondary | ICD-10-CM

## 2016-06-14 DIAGNOSIS — R590 Localized enlarged lymph nodes: Secondary | ICD-10-CM | POA: Diagnosis not present

## 2016-06-14 NOTE — Patient Instructions (Signed)
We will see you back in 1 month for a follow up appointment. Please call our clinic if you have any concerns related to your drain removal or surgical care. You may resume activities such as walking and driving.

## 2016-06-15 NOTE — Telephone Encounter (Signed)
Issue resolved.

## 2016-07-14 ENCOUNTER — Ambulatory Visit: Payer: Medicare Other | Attending: Gynecologic Oncology | Admitting: Gynecologic Oncology

## 2016-07-14 ENCOUNTER — Encounter: Payer: Self-pay | Admitting: Gynecologic Oncology

## 2016-07-14 VITALS — BP 174/95 | HR 77 | Temp 98.6°F | Resp 18 | Ht 66.5 in | Wt 198.6 lb

## 2016-07-14 DIAGNOSIS — Z90710 Acquired absence of both cervix and uterus: Secondary | ICD-10-CM | POA: Diagnosis not present

## 2016-07-14 DIAGNOSIS — I10 Essential (primary) hypertension: Secondary | ICD-10-CM | POA: Diagnosis not present

## 2016-07-14 DIAGNOSIS — J449 Chronic obstructive pulmonary disease, unspecified: Secondary | ICD-10-CM | POA: Diagnosis not present

## 2016-07-14 DIAGNOSIS — Z880 Allergy status to penicillin: Secondary | ICD-10-CM | POA: Insufficient documentation

## 2016-07-14 DIAGNOSIS — R159 Full incontinence of feces: Secondary | ICD-10-CM | POA: Insufficient documentation

## 2016-07-14 DIAGNOSIS — G8929 Other chronic pain: Secondary | ICD-10-CM | POA: Diagnosis not present

## 2016-07-14 DIAGNOSIS — Z8711 Personal history of peptic ulcer disease: Secondary | ICD-10-CM | POA: Insufficient documentation

## 2016-07-14 DIAGNOSIS — Z8541 Personal history of malignant neoplasm of cervix uteri: Secondary | ICD-10-CM | POA: Insufficient documentation

## 2016-07-14 DIAGNOSIS — I890 Lymphedema, not elsewhere classified: Secondary | ICD-10-CM | POA: Diagnosis not present

## 2016-07-14 DIAGNOSIS — R339 Retention of urine, unspecified: Secondary | ICD-10-CM | POA: Diagnosis not present

## 2016-07-14 DIAGNOSIS — Z833 Family history of diabetes mellitus: Secondary | ICD-10-CM | POA: Diagnosis not present

## 2016-07-14 DIAGNOSIS — Z885 Allergy status to narcotic agent status: Secondary | ICD-10-CM | POA: Insufficient documentation

## 2016-07-14 DIAGNOSIS — Z4889 Encounter for other specified surgical aftercare: Secondary | ICD-10-CM | POA: Insufficient documentation

## 2016-07-14 DIAGNOSIS — F17210 Nicotine dependence, cigarettes, uncomplicated: Secondary | ICD-10-CM | POA: Diagnosis not present

## 2016-07-14 DIAGNOSIS — Z881 Allergy status to other antibiotic agents status: Secondary | ICD-10-CM | POA: Diagnosis not present

## 2016-07-14 DIAGNOSIS — M79605 Pain in left leg: Secondary | ICD-10-CM | POA: Diagnosis not present

## 2016-07-14 DIAGNOSIS — Z8249 Family history of ischemic heart disease and other diseases of the circulatory system: Secondary | ICD-10-CM | POA: Insufficient documentation

## 2016-07-14 DIAGNOSIS — Z888 Allergy status to other drugs, medicaments and biological substances status: Secondary | ICD-10-CM | POA: Insufficient documentation

## 2016-07-14 DIAGNOSIS — Z90721 Acquired absence of ovaries, unilateral: Secondary | ICD-10-CM | POA: Insufficient documentation

## 2016-07-14 DIAGNOSIS — R599 Enlarged lymph nodes, unspecified: Secondary | ICD-10-CM

## 2016-07-14 MED ORDER — HYDROMORPHONE HCL 4 MG PO TABS
4.0000 mg | ORAL_TABLET | Freq: Four times a day (QID) | ORAL | 0 refills | Status: DC | PRN
Start: 2016-07-14 — End: 2016-08-08

## 2016-07-14 NOTE — Progress Notes (Signed)
POSTOPERATIVE FOLLOWUP Assessment:    41 y.o. year old with Stage IB1 cervical cancer.   S/p modified radical hysterectomy with bilateral pelvic lymphadenectomy on 01/19/15.  Postop bladder dysfunction/urinary retention: decreased sense of urinary urge, and fecal incontinence. Postop right LE lymphdedema and left groin/lower abdomen lymphedema.  S/p right inguinal lymphadenectomy for symptomatic lymphadenopathy on 06/01/16.  No evidence of cancer recurrence.  Plan: 1) stage IB1 cervical cancer. Recommend continued q 3 monthly surveillance visits until July 2018 (then 6 monthly) and annual vaginal cytology to screen for new HPV related disease.  2) Urinary retention: counseled patient about regular voiding - every 3 hours, and positioning to aid in voiding. Discussed risks of permanent bladder dysfunction if she does not void regularly. 3)  Fecal incontinence: have refered patient to see Dr Maryland Pink for evaluation and possible pelvic floor muscle training. Recommended metamucil for stool bulking. 4) right inguinal adenopathy - s/p right inguinal lymphadenectomy 06/01/16. Benign pathology (vascular proliferation).  Healing normally. Drain pulled today.  Patient will notify us of increasing fullness in the groin (suggestive of recollection of lymph fluid). Discussed that if this occurs she may require percutaneous drainage.  5) lymphedema - continue PT (appreciate their input). I discussed with Mende that there is no surgical or medication intervention to reduce lymphedema. I discussed the chronic nature of the disorder. I explained that after an inguinal lymphadenectomy this will likely progress. She acknowledged that fact verbally. She will restart PT in March, 2018 6) chronic pain:  Have referred her to chronic pain clinic for chronic prescription opioid use (this predated her seeing me for her cervical cancer). However she was fired from practices due to positive tox screens. Have  prescribed the patient 30tabs of short acting oral morphine. I explained the importance of quitting marajuana use in order to be eligible for care at a chronic pain clinic. Will continue to refill only until 6 weeks postop. Today's is the last prescription. 7) HTN: untreated - provided with number for [facility] primary care office  Follow-up 3 months.  HPI:  Melissa Alvarez is a 41 y.o. year old G2P2000 initially seen in consultation on 11/24/14 for cervical cancer.  She then underwent a cold knife conization on [id number] without complications.  Her postoperative course was complicated by a trip to the ER for severe pain post cone. CT imaging showed no abscess or abnormality. Her white count was mildly elevated but she was afebrile. She was discharged from the ER and I prescribed a course of doxy and flagl for presumed cervicitis. She continues to have pain postop op and so I repeated her CT imaging on 12/23/14 and this showed slightly more pelvic fluid, but no abscess. She has multiple fibroids, one of which appears to be degenerating.   Her final pathology from her cone revealed invasive moderately differentiated SCC measuring 1.82mm in greatest dimension with 1.54mm depth of invasion. There were multiple foci all less than 70mm. LVSI was present. 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 The post cone endocervical curretting was positive for fragments of squamous cell carcinoma.  On 01/19/15 she underwent robotic assisted type II modified radical hysterectomy, with RSO and bilateral pelvic lymphadenectomy. Pathology revealed CIS but no residual carcinoma and lymph nodes were negative. She did not meet criteria for needing adjuvant therapy.  Postop she experienced severe nausea and emesis and abdominal pain in the 1st week, and required readmission for supportive care, however she resolved symptoms within 24 hours. CT imaging was unremarkable.  She  developed inguinal masses postoperatively (on the right). These were  biopsied for benign findings (benign reactive lymph node with dermatopathologic changes) in August, 2016.  In December 2016 she reported anal leakage, and poor sensation of a full rectum. She needs to defecate 20 minutes after eating and notes her stools are looser than usual. She has had several "accidents". These symptoms have developed since surgery.  Postoperatively she developed fluctuating fullness in the right groin.The pain was very bothersome and limits quality of life and has caused multiple ED visits.   She had an MRI of the pelvis on 09/14/15 which showed no etiology for her leg pain and no lymphadenopathy. She was sent to PT who diagnosed her with RLE lymphedema and are actively treating her for this. She was referred to pain clinics for her chronic pain use without an apparent active acute source, however she was fired when her tox screens were positive for Marijuana.   She undewent US of the right groing on 03/28/16 which showed increase in prominent lymph nodes to 1.8cm. US guided biopsy on 04/05/16 showed benign spindle cell proliferation. She felt swelling intermittently in the right groin and lower abdomen, less so in the leg and thigh which was affecting and deeply negatively impacted QOL.  INTERVAL HX: On 06/01/16 she underwent right inguinal lymphadenectomy. Pathology revealed benign focal vascular proliferation in 6 lymph nodes.  Postoperatively she has had less pain in the groin and the "lumps" have gone. She reports persistent lymphadema in the mons pubis and left lower extremity.    Current Outpatient Prescriptions on File Prior to Visit  Medication Sig Dispense Refill  . albuterol (ACCUNEB) 0.63 MG/3ML nebulizer solution Take 1 ampule by nebulization every 6 (six) hours as needed for Wheezing.    Marland Kitchen albuterol (PROVENTIL HFA;VENTOLIN HFA) 108 (90 Base) MCG/ACT inhaler Inhale 2 puffs into the lungs once. 1 Inhaler 1   No current facility-administered medications on file  prior to visit.     Allergies  Allergen Reactions  . Other Hives and Anaphylaxis    Steroids. Can take solumedrol by IV Clinical steroids except for solu medrol  . Acetaminophen Hives and Rash    With codeine States she can take percocet   . Erythromycin Hives, Itching and Rash  . Penicillins Hives, Itching and Rash    Has patient had a PCN reaction causing immediate rash, facial/tongue/throat swelling, SOB or lightheadedness with hypotension: Yes Has patient had a PCN reaction causing severe rash involving mucus membranes or skin necrosis: No Has patient had a PCN reaction that required hospitalization No Has patient had a PCN reaction occurring within the last 10 years: No  If all of the above answers are "NO", then may proceed with Cephalosporin use.   Marland Kitchen Doxycycline Hives, Itching and Rash    Other reaction(s): Other (See Comments) Unsure  . Erythromycin Base Rash  . Motrin [Ibuprofen] Hives, Itching and Rash    Past Medical History:  Diagnosis Date  . Anemia   . Asthma   . Cancer ([place])    cervical  . COPD (chronic obstructive pulmonary disease) ([place])   . Depression   . Difficulty sleeping   . Difficulty sleeping   . Family history of adverse reaction to anesthesia    pts daughter and pts mother awaken during surgery;   Marland Kitchen Gastric ulcer    x 5  . History of blood transfusion    last one 2 years ago   . Numbness and tingling  right hip area following June 2016 surgery pt states has improved   . Postoperative nausea and vomiting 01/21/2015  . Shortness of breath dyspnea    exertion    Past Surgical History:  Procedure Laterality Date  . ABDOMINAL HYSTERECTOMY    . APPENDECTOMY    . CERVICAL CONE BIOPSY     x 6  . CERVICAL CONIZATION W/BX N/A 12/08/2014   Procedure: COLD KNIFE CONIZATION OF CERVIX;  Surgeon: [name], MD;  Location: [facility];  Service: Gynecology;  Laterality: N/A;  . CESAREAN SECTION     x 2  . CHOLECYSTECTOMY    . DILATION AND CURETTAGE OF  UTERUS    . LYMPH NODE DISSECTION N/A 06/01/2016   Procedure: RIGHT INGUNAL LYMPH NODE DISSECTION WITH A JP DRAIN;  Surgeon: [name], MD;  Location: [facility];  Service: Gynecology;  Laterality: N/A;  . PELVIC LYMPH NODE DISSECTION N/A 01/19/2015   Procedure: PELVIC LYMPH NODE DISSECTION;  Surgeon: [name], MD;  Location: [facility];  Service: Gynecology;  Laterality: N/A;  . ROBOTIC ASSISTED TOTAL HYSTERECTOMY N/A 01/19/2015   Procedure: ROBOTIC ASSISTED RADICAL HYSTERECTOMY RIGHT TUBE AND OVARY, LEFT TUBE;  Surgeon: [name], MD;  Location: [facility];  Service: Gynecology;  Laterality: N/A;  . SHOULDER SURGERY Left   . TONSILLECTOMY     Family History  Problem Relation Age of Onset  . Cancer Mother   . Hypertension Mother   . Diabetes Mother   . CAD Mother   . Diabetes Other   . Hypertension Other   . Cancer Other   . CAD Other    Social History   Social History  . Marital status: Single    Spouse name: N/A  . Number of children: N/A  . Years of education: N/A   Occupational History  . Not on file.   Social History Main Topics  . Smoking status: Current Every Day Smoker    Packs/day: 3.00    Years: 24.00    Types: Cigarettes  . Smokeless tobacco: Never Used  . Alcohol use No     Comment: past hx of occas use   . Drug use:     Types: Marijuana     Comment: last use 05/02/2016  . Sexual activity: No   Other Topics Concern  . Not on file   Social History Narrative  . No narrative on file     Review of systems: Constitutional:  She has no weight gain or weight loss. She has no fever or chills. Eyes: No blurred vision Ears, Nose, Mouth, Throat: No dizziness, headaches or changes in hearing. No mouth sores. Cardiovascular: No chest pain, palpitations or edema. Respiratory:  No shortness of breath, wheezing or cough Gastrointestinal: She has normal bowel movements without diarrhea or constipation. She denies any nausea or vomiting. She denies blood in her stool or  heart burn. + fecal incontinence Genitourinary:  + groin pain, +bleeding Musculoskeletal: Denies muscle weakness or joint pains.  Skin:  She has no skin changes, rashes or itching Neurological:  No headache Psychiatric:  She denies depression or anxiety. Hematologic/Lymphatic:   No easy bruising or bleeding   Physical Exam: Blood pressure (!) 174/95, pulse 77, temperature 98.6 F (37 C), temperature source Oral, resp. rate 18, height 5' 6.5" (1.689 m), weight 198 lb 9.6 oz (90.1 kg), last menstrual period 12/08/2014, SpO2 100 %. General: Well dressed, well nourished in no apparent distress.   HEENT:  Normocephalic and atraumatic, no lesions.  Extraocular muscles intact. Sclerae anicteric. Pupils equal, round, reactive. No mouth sores or  Abdomen:  Soft, nontender, nondistended.  No palpable masses.  No hepatosplenomegaly.  No ascites. Normal bowel sounds.  No hernias.  Well healed incisions. Genitourinary: deferred Rectal: deferred Extremities: Mild right LE edema compared to left. There is right sided mons pubis and inguinal and lower abdominal lymphedema. Psychiatric: Mood and affect are appropriate. Neurological: Awake, alert and oriented x 3. Sensation is intact, no neuropathy.  Musculoskeletal: No pain, normal strength and range of motion. Lymph nodes: Rt groin incision healed. There is no induration non-fluctuant, no incisional erythema or drainage.  [name], MD

## 2016-07-14 NOTE — Patient Instructions (Signed)
We have made an appt with Wilfred Lacy, NP  at [facility] on [place] Jan 30 at 11am. An information packet will be mailed to you from their office.

## 2016-08-01 ENCOUNTER — Ambulatory Visit (INDEPENDENT_AMBULATORY_CARE_PROVIDER_SITE_OTHER): Payer: Medicare Other | Admitting: Nurse Practitioner

## 2016-08-01 ENCOUNTER — Encounter: Payer: Self-pay | Admitting: Nurse Practitioner

## 2016-08-01 ENCOUNTER — Ambulatory Visit (INDEPENDENT_AMBULATORY_CARE_PROVIDER_SITE_OTHER)
Admission: RE | Admit: 2016-08-01 | Discharge: 2016-08-01 | Disposition: A | Discharge status: Home / Self Care | Payer: Medicare Other | Source: Ambulatory Visit | Attending: Nurse Practitioner | Admitting: Nurse Practitioner

## 2016-08-01 VITALS — BP 168/108 | HR 77 | Temp 98.2°F | Ht 66.5 in | Wt 198.0 lb

## 2016-08-01 DIAGNOSIS — R6889 Other general symptoms and signs: Secondary | ICD-10-CM

## 2016-08-01 DIAGNOSIS — R0989 Other specified symptoms and signs involving the circulatory and respiratory systems: Secondary | ICD-10-CM

## 2016-08-01 DIAGNOSIS — R590 Localized enlarged lymph nodes: Secondary | ICD-10-CM | POA: Diagnosis not present

## 2016-08-01 DIAGNOSIS — J0140 Acute pansinusitis, unspecified: Secondary | ICD-10-CM

## 2016-08-01 DIAGNOSIS — J029 Acute pharyngitis, unspecified: Secondary | ICD-10-CM

## 2016-08-01 DIAGNOSIS — F17200 Nicotine dependence, unspecified, uncomplicated: Secondary | ICD-10-CM

## 2016-08-01 DIAGNOSIS — R221 Localized swelling, mass and lump, neck: Secondary | ICD-10-CM | POA: Diagnosis not present

## 2016-08-01 DIAGNOSIS — I160 Hypertensive urgency: Secondary | ICD-10-CM | POA: Insufficient documentation

## 2016-08-01 LAB — POCT RAPID STREP A (OFFICE): RAPID STREP A SCREEN: NEGATIVE

## 2016-08-01 MED ORDER — LEVOFLOXACIN 500 MG PO TABS: 500.0000 mg | ORAL_TABLET | Freq: Every day | ORAL | 0 refills | Status: DC

## 2016-08-01 MED ORDER — OMEPRAZOLE 20 MG PO CPDR: 20.0000 mg | DELAYED_RELEASE_CAPSULE | Freq: Every day | ORAL | 3 refills | Status: AC

## 2016-08-01 MED ORDER — BENZOCAINE-MENTHOL 6-10 MG MT LOZG: 1.0000 | LOZENGE | OROMUCOSAL | 0 refills | Status: DC | PRN

## 2016-08-01 MED ORDER — LISINOPRIL-HYDROCHLOROTHIAZIDE 10-12.5 MG PO TABS: 1.0000 | ORAL_TABLET | Freq: Every day | ORAL | 2 refills | Status: DC

## 2016-08-01 MED ORDER — AMLODIPINE BESYLATE 5 MG PO TABS: 5.0000 mg | ORAL_TABLET | Freq: Every day | ORAL | 2 refills | Status: DC

## 2016-08-01 MED ORDER — FLUTICASONE PROPIONATE 50 MCG/ACT NA SUSP: 2.0000 | Freq: Every day | NASAL | 0 refills | Status: DC

## 2016-08-01 MED ORDER — DM-GUAIFENESIN ER 30-600 MG PO TB12: 1.0000 | ORAL_TABLET | Freq: Two times a day (BID) | ORAL | 0 refills | Status: DC | PRN

## 2016-08-01 MED ORDER — CLONIDINE HCL 0.1 MG PO TABS
0.2000 mg | ORAL_TABLET | Freq: Once | ORAL | Status: AC
  Administered 2016-08-01: 0.2 mg via ORAL

## 2016-08-01 NOTE — Progress Notes (Signed)
Reviewed with patient in office. See office note

## 2016-08-01 NOTE — Patient Instructions (Addendum)
Start HTN medications today Need to stop tobacco use. Normal DG soft tissue neck. Consider referral to ENT if no improvement in cervical lymphadenopathy in 1week. Repeat following labs in 81month: BMP, TSH, CBC, lipid, hepatic panel.  Hypertension Hypertension is another name for high blood pressure. High blood pressure forces your heart to work harder to pump blood. A blood pressure reading has two numbers, which includes a higher number over a lower number (example: 110/72). Follow these instructions at home:  Have your blood pressure rechecked by your doctor.  Only take medicine as told by your doctor. Follow the directions carefully. The medicine does not work as well if you skip doses. Skipping doses also puts you at risk for problems.  Do not smoke.  Monitor your blood pressure at home as told by your doctor. Contact a doctor if:  You think you are having a reaction to the medicine you are taking.  You have repeat headaches or feel dizzy.  You have puffiness (swelling) in your ankles.  You have trouble with your vision. Get help right away if:  You get a very bad headache and are confused.  You feel weak, numb, or faint.  You get chest or belly (abdominal) pain.  You throw up (vomit).  You cannot breathe very well. This information is not intended to replace advice given to you by your health care provider. Make sure you discuss any questions you have with your health care provider. Document Released: 12/06/2007 Document Revised: 11/25/2015 Document Reviewed: 04/11/2013 Elsevier Interactive Patient Education  2017 [employer].

## 2016-08-01 NOTE — Progress Notes (Signed)
Subjective:    Patient ID: Melissa Alvarez, female    DOB: 1976-03-09, 41 y.o.   MRN: [id number]  Patient presents today for establish care (new patient)   URI   This is a new problem. The current episode started in the past 7 days. The problem has been gradually worsening. There has been no fever. Associated symptoms include congestion, coughing, headaches, rhinorrhea, sinus pain, a sore throat and swollen glands. Pertinent negatives include no abdominal pain, chest pain, diarrhea, dysuria, ear pain, joint pain, joint swelling, nausea, neck pain, plugged ear sensation, rash, sneezing, vomiting or wheezing. She has tried nothing for the symptoms.  Hypertension  This is a new problem. The current episode started more than 1 month ago. The problem is unchanged. The problem is uncontrolled. Associated symptoms include anxiety, headaches and malaise/fatigue. Pertinent negatives include no blurred vision, chest pain, neck pain, orthopnea, peripheral edema, PND, shortness of breath or sweats. Risk factors for coronary artery disease include sedentary lifestyle and smoking/tobacco exposure. Past treatments include nothing.    Immunizations: (TDAP, Hep C screen, Pneumovax, Influenza, zoster)  Health Maintenance  Topic Date Due  . Tetanus Vaccine  05/02/1994  . Flu Shot  02/01/2016  . Pap Smear  10/11/2017  . HIV Screening  Completed   Weight:  Wt Readings from Last 3 Encounters:  08/01/16 198 lb (89.8 kg)  07/14/16 198 lb 9.6 oz (90.1 kg)  06/14/16 197 lb 3.2 oz (89.4 kg)    Fall Risk: Fall Risk  08/01/2016  Falls in the past year? Yes  Number falls in past yr: 2 or more  Injury with Fall? No  Risk for fall due to : Other (Comment)   Depression/Suicide: Depression screen [facility] 2/9 08/01/2016  Decreased Interest 0  Down, Depressed, Hopeless 0  PHQ - 2 Score 0   No flowsheet data found.  Advanced Directives 07/14/2016  Does Patient Have a Medical Advance Directive? No  Type of  Advance Directive -  Does patient want to make changes to medical advance directive? -  Copy of [place] in Chart? -  Would patient like information on creating a medical advance directive? No - Patient declined   Sexual History (birth control, marital status, STD):single, not sexually active.  Medications and allergies reviewed with patient and updated if appropriate.  Patient Active Problem List   Diagnosis Date Noted  . Tobacco dependence 08/02/2016  . Anterior cervical lymphadenopathy 08/02/2016  . Throat fullness 08/02/2016  . Hypertensive urgency 08/01/2016  . Lymphedema of right lower extremity 10/04/2015  . Urinary retention with incomplete bladder emptying 02/22/2015  . Groin mass in female 02/22/2015  . Postoperative nausea and vomiting 01/21/2015  . Cervical cancer, FIGO stage IB1 ([place]) 01/19/2015  . Cervical cancer [facility])     Current Outpatient Prescriptions on File Prior to Visit  Medication Sig Dispense Refill  . albuterol (ACCUNEB) 0.63 MG/3ML nebulizer solution Take 1 ampule by nebulization every 6 (six) hours as needed for Wheezing.    Marland Kitchen albuterol (PROVENTIL HFA;VENTOLIN HFA) 108 (90 Base) MCG/ACT inhaler Inhale 2 puffs into the lungs once. 1 Inhaler 1  . HYDROmorphone (DILAUDID) 4 MG tablet Take 1 tablet (4 mg total) by mouth every 6 (six) hours as needed for severe pain. 30 tablet 0   No current facility-administered medications on file prior to visit.     Past Medical History:  Diagnosis Date  . Anemia   . Asthma   . Cancer ([place])    cervical  .  COPD (chronic obstructive pulmonary disease) ([place])   . Depression   . Difficulty sleeping   . Difficulty sleeping   . Family history of adverse reaction to anesthesia    pts daughter and pts mother awaken during surgery;   Marland Kitchen Gastric ulcer    x 5  . History of blood transfusion    last one 2 years ago   . Numbness and tingling    right hip area following June 2016 surgery pt states has  improved   . Postoperative nausea and vomiting 01/21/2015  . Shortness of breath dyspnea    exertion     Past Surgical History:  Procedure Laterality Date  . ABDOMINAL HYSTERECTOMY  2017  . APPENDECTOMY  1993  . CERVICAL CONE BIOPSY     x 6  . CERVICAL CONIZATION W/BX N/A 12/08/2014   Procedure: COLD KNIFE CONIZATION OF CERVIX;  Surgeon: [name], MD;  Location: [facility];  Service: Gynecology;  Laterality: N/A;  . CESAREAN SECTION     x 2  . CHOLECYSTECTOMY  2009  . DILATION AND CURETTAGE OF UTERUS    . LYMPH NODE DISSECTION N/A 06/01/2016   Procedure: RIGHT INGUNAL LYMPH NODE DISSECTION WITH A JP DRAIN;  Surgeon: [name], MD;  Location: [facility];  Service: Gynecology;  Laterality: N/A;  . PELVIC LYMPH NODE DISSECTION N/A 01/19/2015   Procedure: PELVIC LYMPH NODE DISSECTION;  Surgeon: [name], MD;  Location: [facility];  Service: Gynecology;  Laterality: N/A;  . ROBOTIC ASSISTED TOTAL HYSTERECTOMY N/A 01/19/2015   Procedure: ROBOTIC ASSISTED RADICAL HYSTERECTOMY RIGHT TUBE AND OVARY, LEFT TUBE;  Surgeon: [name], MD;  Location: [facility];  Service: Gynecology;  Laterality: N/A;  . SHOULDER SURGERY Left   . TONSILLECTOMY      Social History   Social History  . Marital status: Single    Spouse name: N/A  . Number of children: N/A  . Years of education: N/A   Social History Main Topics  . Smoking status: Current Every Day Smoker    Packs/day: 3.00    Years: 24.00    Types: Cigarettes  . Smokeless tobacco: Never Used  . Alcohol use Yes     Comment: social  . Drug use: Yes    Types: Marijuana     Comment: last use 05/02/2016  . Sexual activity: No   Other Topics Concern  . None   Social History Narrative  . None    Family History  Problem Relation Age of Onset  . Cancer Mother 7    lymphoma, stomach  . Hypertension Mother   . Diabetes Mother   . CAD Mother   . Diabetes Other   . Hypertension Other   . Cancer Other   . CAD Other   . Heart disease Maternal Aunt    . Hypertension Maternal Aunt         Review of Systems  Constitutional: Positive for malaise/fatigue.  HENT: Positive for congestion, rhinorrhea, sinus pain and sore throat. Negative for ear pain and sneezing.   Eyes: Negative for blurred vision.  Respiratory: Positive for cough. Negative for shortness of breath and wheezing.   Cardiovascular: Negative for chest pain, orthopnea and PND.  Gastrointestinal: Negative for abdominal pain, diarrhea, nausea and vomiting.  Genitourinary: Negative for dysuria.  Musculoskeletal: Negative for joint pain and neck pain.  Skin: Negative for rash.  Neurological: Positive for headaches.    Objective:   Vitals:   08/01/16 1106  BP: (!) 168/108  Pulse: 77  Temp: 98.2 F (36.8 C)    Body mass index is 31.48 kg/m.   Physical Examination:  Physical Exam  Constitutional: She is oriented to person, place, and time and well-developed, well-nourished, and in no distress. No distress.  HENT:  Right Ear: Tympanic membrane, external ear and ear canal normal.  Left Ear: Tympanic membrane and external ear normal.  Nose: Mucosal edema and rhinorrhea present. Right sinus exhibits maxillary sinus tenderness and frontal sinus tenderness. Left sinus exhibits maxillary sinus tenderness and frontal sinus tenderness.  Mouth/Throat: Posterior oropharyngeal erythema present. No oropharyngeal exudate.  Eyes: Conjunctivae and EOM are normal. Pupils are equal, round, and reactive to light. No scleral icterus.  Neck: Normal range of motion. Neck supple.  Cardiovascular: Normal rate, normal heart sounds and intact distal pulses.   Pulmonary/Chest: Effort normal and breath sounds normal. She exhibits no tenderness.  Abdominal: Soft. Bowel sounds are normal.  Lymphadenopathy:    She has cervical adenopathy.  Neurological: She is alert and oriented to person, place, and time. Gait normal.  Skin: Skin is warm and dry.  Psychiatric: Affect and judgment normal.    Vitals reviewed.   ASSESSMENT and PLAN:  Wm was seen today for establish care.  Diagnoses and all orders for this visit:  Hypertensive urgency -     cloNIDine (CATAPRES) tablet 0.2 mg; Take 2 tablets (0.2 mg total) by mouth once. -     lisinopril-hydrochlorothiazide (PRINZIDE,ZESTORETIC) 10-12.5 MG tablet; Take 1 tablet by mouth daily. -     amLODipine (NORVASC) 5 MG tablet; Take 1 tablet (5 mg total) by mouth daily.  Acute pharyngitis, unspecified etiology -     POCT rapid strep A -     omeprazole (PRILOSEC) 20 MG capsule; Take 1 capsule (20 mg total) by mouth daily. -     benzocaine-menthol (CHLORASEPTIC SORE THROAT) 6-10 MG lozenge; Take 1 lozenge by mouth as needed for sore throat. -     fluticasone (FLONASE) 50 MCG/ACT nasal spray; Place 2 sprays into both nostrils daily. -     levofloxacin (LEVAQUIN) 500 MG tablet; Take 1 tablet (500 mg total) by mouth daily. -     dextromethorphan-guaiFENesin (MUCINEX DM) 30-600 MG 12hr tablet; Take 1 tablet by mouth 2 (two) times daily as needed for cough.  Throat fullness -     DG Neck Soft Tissue; Future  Anterior cervical lymphadenopathy -     DG Neck Soft Tissue; Future -     levofloxacin (LEVAQUIN) 500 MG tablet; Take 1 tablet (500 mg total) by mouth daily.  Acute non-recurrent pansinusitis -     levofloxacin (LEVAQUIN) 500 MG tablet; Take 1 tablet (500 mg total) by mouth daily. -     dextromethorphan-guaiFENesin (MUCINEX DM) 30-600 MG 12hr tablet; Take 1 tablet by mouth 2 (two) times daily as needed for cough.  Tobacco dependence    No problem-specific Assessment & Plan notes found for this encounter.    Recent Results (from the past 2160 hour(s))  CBC WITH DIFFERENTIAL     Status: Abnormal   Collection Time: 05/30/16  9:08 AM  Result Value Ref Range   WBC 13.5 (H) 4.0 - 10.5 K/uL   RBC 4.42 3.87 - 5.11 MIL/uL   Hemoglobin 13.5 12.0 - 15.0 g/dL   HCT 41.0 36.0 - 46.0 %   MCV 92.8 78.0 - 100.0 fL   MCH 30.5 26.0 -  34.0 pg   MCHC 32.9 30.0 - 36.0 g/dL   RDW 13.5 11.5 -  15.5 %   Platelets 192 150 - 400 K/uL   Neutrophils Relative % 79 %   Neutro Abs 10.5 (H) 1.7 - 7.7 K/uL   Lymphocytes Relative 16 %   Lymphs Abs 2.2 0.7 - 4.0 K/uL   Monocytes Relative 4 %   Monocytes Absolute 0.6 0.1 - 1.0 K/uL   Eosinophils Relative 1 %   Eosinophils Absolute 0.2 0.0 - 0.7 K/uL   Basophils Relative 0 %   Basophils Absolute 0.0 0.0 - 0.1 K/uL  Comprehensive metabolic panel     Status: Abnormal   Collection Time: 05/30/16  9:08 AM  Result Value Ref Range   Sodium 137 135 - 145 mmol/L   Potassium 4.0 3.5 - 5.1 mmol/L   Chloride 107 101 - 111 mmol/L   CO2 24 22 - 32 mmol/L   Glucose, Bld 109 (H) 65 - 99 mg/dL   BUN 6 6 - 20 mg/dL   Creatinine, Ser 0.71 0.44 - 1.00 mg/dL   Calcium 8.9 8.9 - 10.3 mg/dL   Total Protein 7.7 6.5 - 8.1 g/dL   Albumin 4.0 3.5 - 5.0 g/dL   AST 22 15 - 41 U/L   ALT 15 14 - 54 U/L   Alkaline Phosphatase 59 38 - 126 U/L   Total Bilirubin 0.6 0.3 - 1.2 mg/dL   GFR calc non Af Amer >60 >60 mL/min   GFR calc Af Amer >60 >60 mL/min    Comment: (NOTE) The eGFR has been calculated using the CKD EPI equation. This calculation has not been validated in all clinical situations. eGFR's persistently <60 mL/min signify possible Chronic Kidney Disease.    Anion gap 6 5 - 15  Type and screen     Status: None   Collection Time: 05/30/16  9:08 AM  Result Value Ref Range   ABO/RH(D) O POS    Antibody Screen NEG    Sample Expiration 06/04/2016    Extend sample reason NO TRANSFUSIONS OR PREGNANCY IN THE PAST 3 MONTHS   Urinalysis, Routine w reflex microscopic (not at [facility])     Status: None   Collection Time: 05/30/16  9:08 AM  Result Value Ref Range   Color, Urine YELLOW YELLOW   APPearance CLEAR CLEAR   Specific Gravity, Urine 1.014 1.005 - 1.030   pH 6.5 5.0 - 8.0   Glucose, UA NEGATIVE NEGATIVE mg/dL   Hgb urine dipstick NEGATIVE NEGATIVE   Bilirubin Urine NEGATIVE NEGATIVE    Ketones, ur NEGATIVE NEGATIVE mg/dL   Protein, ur NEGATIVE NEGATIVE mg/dL   Nitrite NEGATIVE NEGATIVE   Leukocytes, UA NEGATIVE NEGATIVE    Comment: MICROSCOPIC NOT DONE ON URINES WITH NEGATIVE PROTEIN, BLOOD, LEUKOCYTES, NITRITE, OR GLUCOSE <1000 mg/dL.  POCT rapid strep A     Status: None   Collection Time: 08/01/16 11:48 AM  Result Value Ref Range   Rapid Strep A Screen Negative Negative   Follow up: Return in about 1 week (around 08/08/2016) for HTN.  Wilfred Lacy, NP

## 2016-08-01 NOTE — Progress Notes (Signed)
Pre visit review using our clinic review tool, if applicable. No additional management support is needed unless otherwise documented below in the visit note. 

## 2016-08-02 DIAGNOSIS — R590 Localized enlarged lymph nodes: Secondary | ICD-10-CM | POA: Insufficient documentation

## 2016-08-02 DIAGNOSIS — R6889 Other general symptoms and signs: Secondary | ICD-10-CM | POA: Insufficient documentation

## 2016-08-02 DIAGNOSIS — R0989 Other specified symptoms and signs involving the circulatory and respiratory systems: Secondary | ICD-10-CM | POA: Insufficient documentation

## 2016-08-02 DIAGNOSIS — F17200 Nicotine dependence, unspecified, uncomplicated: Secondary | ICD-10-CM | POA: Insufficient documentation

## 2016-08-08 ENCOUNTER — Encounter: Payer: Self-pay | Admitting: Nurse Practitioner

## 2016-08-08 ENCOUNTER — Ambulatory Visit (INDEPENDENT_AMBULATORY_CARE_PROVIDER_SITE_OTHER): Payer: Medicare Other | Admitting: Nurse Practitioner

## 2016-08-08 ENCOUNTER — Other Ambulatory Visit (INDEPENDENT_AMBULATORY_CARE_PROVIDER_SITE_OTHER): Payer: Medicare Other

## 2016-08-08 VITALS — BP 174/106 | HR 74 | Temp 98.3°F | Ht 66.5 in | Wt 197.0 lb

## 2016-08-08 DIAGNOSIS — E049 Nontoxic goiter, unspecified: Secondary | ICD-10-CM

## 2016-08-08 DIAGNOSIS — Z136 Encounter for screening for cardiovascular disorders: Secondary | ICD-10-CM

## 2016-08-08 DIAGNOSIS — I160 Hypertensive urgency: Secondary | ICD-10-CM | POA: Diagnosis not present

## 2016-08-08 DIAGNOSIS — Z13220 Encounter for screening for lipoid disorders: Secondary | ICD-10-CM | POA: Diagnosis not present

## 2016-08-08 DIAGNOSIS — E039 Hypothyroidism, unspecified: Secondary | ICD-10-CM

## 2016-08-08 LAB — LIPID PANEL
CHOL/HDL RATIO: 4
Cholesterol: 148 mg/dL (ref 0–200)
HDL: 39.8 mg/dL (ref >39.00)
LDL CALC: 85 mg/dL (ref 0–99)
NONHDL: 108
Triglycerides: 116 mg/dL (ref 0.0–149.0)
VLDL: 23.2 mg/dL (ref 0.0–40.0)

## 2016-08-08 LAB — COMPREHENSIVE METABOLIC PANEL
ALT: 14 U/L (ref 0–35)
AST: 19 U/L (ref 0–37)
Albumin: 4.3 g/dL (ref 3.5–5.2)
Alkaline Phosphatase: 71 U/L (ref 39–117)
BUN: 6 mg/dL (ref 6–23)
CHLORIDE: 107 meq/L (ref 96–112)
CO2: 26 meq/L (ref 19–32)
CREATININE: 0.75 mg/dL (ref 0.40–1.20)
Calcium: 9.4 mg/dL (ref 8.4–10.5)
GFR: 109.37 mL/min (ref >60.00)
GLUCOSE: 87 mg/dL (ref 70–99)
Potassium: 3.4 mEq/L — ABNORMAL LOW (ref 3.5–5.1)
SODIUM: 137 meq/L (ref 135–145)
Total Bilirubin: 0.5 mg/dL (ref 0.2–1.2)
Total Protein: 7.9 g/dL (ref 6.0–8.3)

## 2016-08-08 LAB — TSH: TSH: 9.49 u[IU]/mL — AB (ref 0.35–4.50)

## 2016-08-08 LAB — T4, FREE: FREE T4: 0.54 ng/dL — AB (ref 0.60–1.60)

## 2016-08-08 NOTE — Progress Notes (Signed)
Subjective:  Patient ID: Melissa Alvarez, female    DOB: 1975-12-23  Age: 41 y.o. MRN: [id number]  CC: Follow-up (follow up on BP)  Hypertension  This is a chronic problem. The current episode started more than 1 year ago. The problem is unchanged. The problem is uncontrolled. Associated symptoms include headaches, malaise/fatigue and shortness of breath. Pertinent negatives include no anxiety, palpitations or peripheral edema. There are no associated agents to hypertension. Risk factors for coronary artery disease include post-menopausal state, sedentary lifestyle, smoking/tobacco exposure and stress. Past treatments include diuretics, ACE inhibitors and calcium channel blockers. The current treatment provides no improvement. Compliance problems include medication cost.     has not been taking BP medication as prescribed.  Started medication 3days ago.  Outpatient Medications Prior to Visit  Medication Sig Dispense Refill  . albuterol (ACCUNEB) 0.63 MG/3ML nebulizer solution Take 1 ampule by nebulization every 6 (six) hours as needed for Wheezing.    Marland Kitchen albuterol (PROVENTIL HFA;VENTOLIN HFA) 108 (90 Base) MCG/ACT inhaler Inhale 2 puffs into the lungs once. 1 Inhaler 1  . amLODipine (NORVASC) 5 MG tablet Take 1 tablet (5 mg total) by mouth daily. 30 tablet 2  . lisinopril-hydrochlorothiazide (PRINZIDE,ZESTORETIC) 10-12.5 MG tablet Take 1 tablet by mouth daily. 30 tablet 2  . omeprazole (PRILOSEC) 20 MG capsule Take 1 capsule (20 mg total) by mouth daily. 30 capsule 3  . benzocaine-menthol (CHLORASEPTIC SORE THROAT) 6-10 MG lozenge Take 1 lozenge by mouth as needed for sore throat. 100 tablet 0  . dextromethorphan-guaiFENesin (MUCINEX DM) 30-600 MG 12hr tablet Take 1 tablet by mouth 2 (two) times daily as needed for cough. 14 tablet 0  . fluticasone (FLONASE) 50 MCG/ACT nasal spray Place 2 sprays into both nostrils daily. 16 g 0  . levofloxacin (LEVAQUIN) 500 MG tablet Take 1 tablet (500 mg  total) by mouth daily. 7 tablet 0  . HYDROmorphone (DILAUDID) 4 MG tablet Take 1 tablet (4 mg total) by mouth every 6 (six) hours as needed for severe pain. (Patient not taking: Reported on 08/08/2016) 30 tablet 0   No facility-administered medications prior to visit.     ROS See HPI  Objective:  BP (!) 174/106   Pulse 74   Temp 98.3 F (36.8 C)   Ht 5' 6.5" (1.689 m)   Wt 197 lb (89.4 kg)   LMP 01/19/2015 Comment: started after last surgery  SpO2 100%   BMI 31.32 kg/m   BP Readings from Last 3 Encounters:  08/08/16 (!) 174/106  08/01/16 (!) 168/108  07/14/16 (!) 174/95    Wt Readings from Last 3 Encounters:  08/08/16 197 lb (89.4 kg)  08/01/16 198 lb (89.8 kg)  07/14/16 198 lb 9.6 oz (90.1 kg)    Physical Exam  Constitutional: She is oriented to person, place, and time. No distress.  HENT:  Right Ear: External ear normal.  Left Ear: External ear normal.  Nose: Nose normal.  Mouth/Throat: No oropharyngeal exudate.  Eyes: No scleral icterus.  Neck: Normal range of motion. Neck supple. Thyromegaly present.  Cardiovascular: Normal rate, regular rhythm and normal heart sounds.   Pulmonary/Chest: Effort normal and breath sounds normal. No respiratory distress.  Abdominal: Soft. She exhibits no distension.  Musculoskeletal: Normal range of motion. She exhibits no edema.  Lymphadenopathy:    She has no cervical adenopathy.  Neurological: She is alert and oriented to person, place, and time.  Skin: Skin is warm and dry.  Psychiatric: She has a normal mood and  affect. Her behavior is normal.    Lab Results  Component Value Date   WBC 13.5 (H) 05/30/2016   HGB 13.5 05/30/2016   HCT 41.0 05/30/2016   PLT 192 05/30/2016   GLUCOSE 109 (H) 05/30/2016   ALT 15 05/30/2016   AST 22 05/30/2016   NA 137 05/30/2016   K 4.0 05/30/2016   CL 107 05/30/2016   CREATININE 0.71 05/30/2016   BUN 6 05/30/2016   CO2 24 05/30/2016   INR 1.02 04/05/2016    Dg Neck Soft  Tissue  Result Date: 08/01/2016 CLINICAL DATA:  Throat fullness. EXAM: NECK SOFT TISSUES - 1+ VIEW COMPARISON:  None. FINDINGS: There is no evidence of retropharyngeal soft tissue swelling or epiglottic enlargement. The cervical airway is unremarkable and no radio-opaque foreign body identified. Multi level degenerative disc disease noted. Most advanced at C4-5 and C5-6. IMPRESSION: 1. No acute findings. 2. Degenerative disc disease. Electronically Signed   By: Kerby Moors M.D.   On: 08/01/2016 12:51    Assessment & Plan:   Melissa Alvarez was seen today for follow-up.  Diagnoses and all orders for this visit:  Hypertensive urgency -     Comprehensive metabolic panel; Future  Enlarged thyroid gland -     Lipid panel; Future -     TSH; Future -     T4, free; Future -     US THYROID; Future  Encounter for lipid screening for cardiovascular disease -     Lipid panel; Future   I have discontinued Melissa Alvarez's HYDROmorphone, benzocaine-menthol, fluticasone, levofloxacin, and dextromethorphan-guaiFENesin. I am also having her maintain her albuterol, albuterol, lisinopril-hydrochlorothiazide, amLODipine, and omeprazole.  No orders of the defined types were placed in this encounter.   Follow-up: Return in about 1 week (around 08/15/2016) for HTN.  Wilfred Lacy, NP

## 2016-08-08 NOTE — Progress Notes (Signed)
Pre visit review using our clinic review tool, if applicable. No additional management support is needed unless otherwise documented below in the visit note. 

## 2016-08-08 NOTE — Patient Instructions (Signed)
Take medications as prescribed.  Check blood pressure three time a week and record.  Bring BP records to next office visit.   Hypertension Hypertension is another name for high blood pressure. High blood pressure forces your heart to work harder to pump blood. A blood pressure reading has two numbers, which includes a higher number over a lower number (example: 110/72). Follow these instructions at home:  Have your blood pressure rechecked by your doctor.  Only take medicine as told by your doctor. Follow the directions carefully. The medicine does not work as well if you skip doses. Skipping doses also puts you at risk for problems.  Do not smoke.  Monitor your blood pressure at home as told by your doctor. Contact a doctor if:  You think you are having a reaction to the medicine you are taking.  You have repeat headaches or feel dizzy.  You have puffiness (swelling) in your ankles.  You have trouble with your vision. Get help right away if:  You get a very bad headache and are confused.  You feel weak, numb, or faint.  You get chest or belly (abdominal) pain.  You throw up (vomit).  You cannot breathe very well. This information is not intended to replace advice given to you by your health care provider. Make sure you discuss any questions you have with your health care provider. Document Released: 12/06/2007 Document Revised: 11/25/2015 Document Reviewed: 04/11/2013 Elsevier Interactive Patient Education  2017 [employer].

## 2016-08-09 ENCOUNTER — Telehealth: Payer: Self-pay | Admitting: Gynecologic Oncology

## 2016-08-09 NOTE — Telephone Encounter (Signed)
Patient had called this am to follow up on her request made yesterday about having a scan of her thyroid and reporting moderate pain.  She spoke with Jovita Gamma, RN yesterday stating she was told to contact us to have a scan ordered to evaluate her thyroid.  She is currently having her thyroid issues worked up with Wilfred Lacy, NP.  Attempted to call patient back and inform her she would need to continue her workup with C. Nche, NP since this is not a GYN related issue.  Unable to leave a message.

## 2016-08-10 DIAGNOSIS — E039 Hypothyroidism, unspecified: Secondary | ICD-10-CM | POA: Insufficient documentation

## 2016-08-10 MED ORDER — LEVOTHYROXINE SODIUM 50 MCG PO TABS: 50.0000 ug | ORAL_TABLET | Freq: Every day | ORAL | 2 refills | Status: DC

## 2016-08-11 ENCOUNTER — Telehealth: Payer: Self-pay | Admitting: Nurse Practitioner

## 2016-08-11 ENCOUNTER — Telehealth: Payer: Self-pay

## 2016-08-11 ENCOUNTER — Ambulatory Visit: Payer: Medicare Other | Admitting: Family Medicine

## 2016-08-11 NOTE — Telephone Encounter (Signed)
Pt called stating she is having some bad head neck and leg from surgery and is wondering if she can get something for the pain.

## 2016-08-11 NOTE — Telephone Encounter (Signed)
She needs to contact GYN oncologist to manage pain related to surgery. For headache and neck pain, he will need to ne evaluated in office again, due to recent uncontrolled BP readings. Since I do not have an openings today, she can be scheduled with out Saturday clinic. Thank you

## 2016-08-11 NOTE — Telephone Encounter (Signed)
Pt called for the 3rd time this week to request pain medication for her neck pain and headache. Melissa Alvarez has been informed multiple times that Dr Denman George is not the practitioner managing her thyroid issue and neck pain. She is to follow up with Flossie Buffy, NP at [facility], which she has and her NP has also not prescribe pain medication. The office notes discuss uncontrolled hypertensive issues and that pt was offered an appointment tomorrow/ Saturday to see the practitioner at [facility] for this issue. Writer again informed Melissa Alvarez that since her pain was in no way surgical or GYN related, she needed to follow up at [place]. Our office has made attempt to get her set up with a pain clinic for unspecified pain management. She has been denied by multiple pain clinics. Pt became verbally abusive and yelling at RN and then Melissa Alvarez hung up on the nurse.

## 2016-08-11 NOTE — Telephone Encounter (Signed)
Patient contacted and stated awareness 

## 2016-08-15 ENCOUNTER — Ambulatory Visit
Admission: RE | Admit: 2016-08-15 | Discharge: 2016-08-15 | Disposition: A | Discharge status: Home / Self Care | Payer: Medicare Other | Source: Ambulatory Visit | Attending: Nurse Practitioner | Admitting: Nurse Practitioner

## 2016-08-15 ENCOUNTER — Encounter: Payer: Self-pay | Admitting: Nurse Practitioner

## 2016-08-15 ENCOUNTER — Ambulatory Visit (INDEPENDENT_AMBULATORY_CARE_PROVIDER_SITE_OTHER): Payer: Medicare Other | Admitting: Nurse Practitioner

## 2016-08-15 VITALS — BP 164/100 | HR 81 | Temp 98.5°F | Ht 66.5 in | Wt 200.0 lb

## 2016-08-15 DIAGNOSIS — I160 Hypertensive urgency: Secondary | ICD-10-CM

## 2016-08-15 DIAGNOSIS — E010 Iodine-deficiency related diffuse (endemic) goiter: Secondary | ICD-10-CM | POA: Diagnosis not present

## 2016-08-15 DIAGNOSIS — E039 Hypothyroidism, unspecified: Secondary | ICD-10-CM

## 2016-08-15 DIAGNOSIS — E049 Nontoxic goiter, unspecified: Secondary | ICD-10-CM

## 2016-08-15 MED ORDER — LISINOPRIL-HYDROCHLOROTHIAZIDE 20-12.5 MG PO TABS: 1.0000 | ORAL_TABLET | Freq: Every day | ORAL | 3 refills | Status: DC

## 2016-08-15 NOTE — Patient Instructions (Addendum)
Take levothyroixine on empty stomach and at least 62mins apart from other medications.  Continue to check BP once a day and record. Bring BP reading to next office visit.  Strongly encourage to quit tobacco use.  Bring medications to next office visit.

## 2016-08-15 NOTE — Progress Notes (Signed)
Subjective:  Patient ID: Melissa Alvarez, female    DOB: September 26, 1975  Age: 41 y.o. MRN: [id number]  CC: Follow-up (1 wk fu on BP.)   Hypertension  This is a chronic problem. The current episode started more than 1 month ago. The problem is unchanged. The problem is uncontrolled. Pertinent negatives include no anxiety, blurred vision, chest pain, headaches, malaise/fatigue, neck pain, orthopnea, palpitations, peripheral edema or shortness of breath. Agents associated with hypertension include thyroid hormones. Risk factors for coronary artery disease include post-menopausal state, sedentary lifestyle and smoking/tobacco exposure. Past treatments include ACE inhibitors, diuretics and calcium channel blockers. The current treatment provides no improvement. Compliance problems include medication cost.  Identifiable causes of hypertension include a thyroid problem.    Outpatient Medications Prior to Visit  Medication Sig Dispense Refill  . albuterol (ACCUNEB) 0.63 MG/3ML nebulizer solution Take 1 ampule by nebulization every 6 (six) hours as needed for Wheezing.    Marland Kitchen albuterol (PROVENTIL HFA;VENTOLIN HFA) 108 (90 Base) MCG/ACT inhaler Inhale 2 puffs into the lungs once. 1 Inhaler 1  . amLODipine (NORVASC) 5 MG tablet Take 1 tablet (5 mg total) by mouth daily. 30 tablet 2  . levothyroxine (SYNTHROID, LEVOTHROID) 50 MCG tablet Take 1 tablet (50 mcg total) by mouth daily before breakfast. 30 tablet 2  . omeprazole (PRILOSEC) 20 MG capsule Take 1 capsule (20 mg total) by mouth daily. 30 capsule 3  . lisinopril-hydrochlorothiazide (PRINZIDE,ZESTORETIC) 10-12.5 MG tablet Take 1 tablet by mouth daily. 30 tablet 2   No facility-administered medications prior to visit.     ROS See HPI  Objective:  BP (!) 164/100   Pulse 81   Temp 98.5 F (36.9 C)   Ht 5' 6.5" (1.689 m)   Wt 200 lb (90.7 kg)   LMP 01/19/2015 Comment: started after last surgery  SpO2 99%   BMI 31.80 kg/m   BP Readings from  Last 3 Encounters:  08/15/16 (!) 164/100  08/08/16 (!) 174/106  08/01/16 (!) 168/108    Wt Readings from Last 3 Encounters:  08/15/16 200 lb (90.7 kg)  08/08/16 197 lb (89.4 kg)  08/01/16 198 lb (89.8 kg)    Physical Exam  Constitutional: She is oriented to person, place, and time. No distress.  HENT:  Right Ear: External ear normal.  Left Ear: External ear normal.  Nose: Nose normal.  Mouth/Throat: No oropharyngeal exudate.  Eyes: No scleral icterus.  Neck: Normal range of motion. Neck supple. Thyromegaly present.  Cardiovascular: Normal rate, regular rhythm and normal heart sounds.   Pulmonary/Chest: Effort normal and breath sounds normal. No respiratory distress.  Musculoskeletal: Normal range of motion. She exhibits no edema.  Neurological: She is alert and oriented to person, place, and time.  Skin: Skin is warm and dry.  Psychiatric: She has a normal mood and affect. Her behavior is normal.  Vitals reviewed.   Lab Results  Component Value Date   WBC 13.5 (H) 05/30/2016   HGB 13.5 05/30/2016   HCT 41.0 05/30/2016   PLT 192 05/30/2016   GLUCOSE 87 08/08/2016   CHOL 148 08/08/2016   TRIG 116.0 08/08/2016   HDL 39.80 08/08/2016   LDLCALC 85 08/08/2016   ALT 14 08/08/2016   AST 19 08/08/2016   NA 137 08/08/2016   K 3.4 (L) 08/08/2016   CL 107 08/08/2016   CREATININE 0.75 08/08/2016   BUN 6 08/08/2016   CO2 26 08/08/2016   TSH 9.49 (H) 08/08/2016   INR 1.02 04/05/2016  Dg Neck Soft Tissue  Result Date: 08/01/2016 CLINICAL DATA:  Throat fullness. EXAM: NECK SOFT TISSUES - 1+ VIEW COMPARISON:  None. FINDINGS: There is no evidence of retropharyngeal soft tissue swelling or epiglottic enlargement. The cervical airway is unremarkable and no radio-opaque foreign body identified. Multi level degenerative disc disease noted. Most advanced at C4-5 and C5-6. IMPRESSION: 1. No acute findings. 2. Degenerative disc disease. Electronically Signed   By: Kerby Moors M.D.    On: 08/01/2016 12:51    Assessment & Plan:   Makay was seen today for follow-up.  Diagnoses and all orders for this visit:  Hypertensive urgency -     lisinopril-hydrochlorothiazide (PRINZIDE,ZESTORETIC) 20-12.5 MG tablet; Take 1 tablet by mouth daily.   I have discontinued Ms. Harnden's lisinopril-hydrochlorothiazide. I am also having her start on lisinopril-hydrochlorothiazide. Additionally, I am having her maintain her albuterol, albuterol, amLODipine, omeprazole, and levothyroxine.  Meds ordered this encounter  Medications  . lisinopril-hydrochlorothiazide (PRINZIDE,ZESTORETIC) 20-12.5 MG tablet    Sig: Take 1 tablet by mouth daily.    Dispense:  30 tablet    Refill:  3    Order Specific Question:   Supervising Provider    Answer:   Cassandria Anger [[id number]]    Follow-up: Return in about 1 month (around 09/12/2016), or if symptoms worsen or fail to improve, for HTN and hypothyroidism.Wilfred Lacy, NP

## 2016-08-15 NOTE — Progress Notes (Signed)
Pre visit review using our clinic review tool, if applicable. No additional management support is needed unless otherwise documented below in the visit note. 

## 2016-09-04 ENCOUNTER — Emergency Department (HOSPITAL_COMMUNITY): Payer: Medicare Other

## 2016-09-04 ENCOUNTER — Emergency Department (HOSPITAL_BASED_OUTPATIENT_CLINIC_OR_DEPARTMENT_OTHER): Admit: 2016-09-04 | Discharge: 2016-09-04 | Disposition: A | Discharge status: Home / Self Care | Payer: Medicare Other

## 2016-09-04 ENCOUNTER — Emergency Department (HOSPITAL_COMMUNITY)
Admission: EM | Admit: 2016-09-04 | Discharge: 2016-09-04 | Disposition: A | Discharge status: Home / Self Care | Payer: Medicare Other | Attending: Emergency Medicine | Admitting: Emergency Medicine

## 2016-09-04 ENCOUNTER — Encounter (HOSPITAL_COMMUNITY): Payer: Self-pay | Admitting: *Deleted

## 2016-09-04 DIAGNOSIS — F17210 Nicotine dependence, cigarettes, uncomplicated: Secondary | ICD-10-CM | POA: Diagnosis not present

## 2016-09-04 DIAGNOSIS — M79604 Pain in right leg: Secondary | ICD-10-CM

## 2016-09-04 DIAGNOSIS — E039 Hypothyroidism, unspecified: Secondary | ICD-10-CM | POA: Diagnosis not present

## 2016-09-04 DIAGNOSIS — J449 Chronic obstructive pulmonary disease, unspecified: Secondary | ICD-10-CM | POA: Diagnosis not present

## 2016-09-04 DIAGNOSIS — G8929 Other chronic pain: Secondary | ICD-10-CM | POA: Diagnosis not present

## 2016-09-04 DIAGNOSIS — R109 Unspecified abdominal pain: Secondary | ICD-10-CM | POA: Diagnosis not present

## 2016-09-04 DIAGNOSIS — R030 Elevated blood-pressure reading, without diagnosis of hypertension: Secondary | ICD-10-CM | POA: Insufficient documentation

## 2016-09-04 DIAGNOSIS — M79609 Pain in unspecified limb: Secondary | ICD-10-CM | POA: Diagnosis not present

## 2016-09-04 DIAGNOSIS — M79651 Pain in right thigh: Secondary | ICD-10-CM | POA: Diagnosis not present

## 2016-09-04 DIAGNOSIS — Z8541 Personal history of malignant neoplasm of cervix uteri: Secondary | ICD-10-CM | POA: Diagnosis not present

## 2016-09-04 LAB — CBC WITH DIFFERENTIAL/PLATELET
Basophils Absolute: 0 10*3/uL (ref 0.0–0.1)
Basophils Relative: 0 %
EOS ABS: 0.2 10*3/uL (ref 0.0–0.7)
Eosinophils Relative: 2 %
HCT: 41.4 % (ref 36.0–46.0)
HEMOGLOBIN: 13.7 g/dL (ref 12.0–15.0)
LYMPHS PCT: 31 %
Lymphs Abs: 2.8 10*3/uL (ref 0.7–4.0)
MCH: 30.4 pg (ref 26.0–34.0)
MCHC: 33.1 g/dL (ref 30.0–36.0)
MCV: 91.8 fL (ref 78.0–100.0)
Monocytes Absolute: 0.6 10*3/uL (ref 0.1–1.0)
Monocytes Relative: 6 %
NEUTROS PCT: 61 %
Neutro Abs: 5.7 10*3/uL (ref 1.7–7.7)
Platelets: 214 10*3/uL (ref 150–400)
RBC: 4.51 MIL/uL (ref 3.87–5.11)
RDW: 13.5 % (ref 11.5–15.5)
WBC: 9.2 10*3/uL (ref 4.0–10.5)

## 2016-09-04 LAB — BASIC METABOLIC PANEL WITH GFR
Anion gap: 9 (ref 5–15)
BUN: 8 mg/dL (ref 6–20)
CO2: 24 mmol/L (ref 22–32)
Calcium: 9 mg/dL (ref 8.9–10.3)
Chloride: 105 mmol/L (ref 101–111)
Creatinine, Ser: 0.81 mg/dL (ref 0.44–1.00)
GFR calc Af Amer: >60 mL/min
GFR calc non Af Amer: >60 mL/min
Glucose, Bld: 88 mg/dL (ref 65–99)
Potassium: 3.5 mmol/L (ref 3.5–5.1)
Sodium: 138 mmol/L (ref 135–145)

## 2016-09-04 MED ORDER — HYDROMORPHONE HCL 4 MG PO TABS: 4.0000 mg | ORAL_TABLET | ORAL | 0 refills | Status: AC | PRN

## 2016-09-04 MED ORDER — MORPHINE SULFATE (PF) 4 MG/ML IV SOLN
4.0000 mg | Freq: Once | INTRAVENOUS | Status: AC
  Administered 2016-09-04: 4 mg via INTRAVENOUS
  Filled 2016-09-04: qty 1

## 2016-09-04 NOTE — ED Triage Notes (Addendum)
Pt reports R Leg pain, pt reports hx of sx last month for lymphedema, pt called surgeon Amirosi who told her to come her for eval, pt ambulatory with pain, little to no swelling noted, A&O X4

## 2016-09-04 NOTE — ED Provider Notes (Addendum)
[facility] DEPT Provider Note   CSN: [id number] Arrival date & time: 09/04/16  1300     History   Chief Complaint Chief Complaint  Patient presents with  . Leg Pain    HPI Melissa Alvarez is a 41 y.o. female.  HPI Complains of right leg pain from groin to thigh with tingling in toes onset one week ago. Pain is worse with changing position with walking improved with remaining still. No treatment prior to coming here. No fever no injury no other associated symptoms. She states the pain is from "lymphedema" Past Medical History:  Diagnosis Date  . Anemia   . Asthma   . Cancer ([place])    cervical  . COPD (chronic obstructive pulmonary disease) ([place])   . Depression   . Difficulty sleeping   . Difficulty sleeping   . Family history of adverse reaction to anesthesia    pts daughter and pts mother awaken during surgery;   Marland Kitchen Gastric ulcer    x 5  . History of blood transfusion    last one 2 years ago   . Numbness and tingling    right hip area following June 2016 surgery pt states has improved   . Postoperative nausea and vomiting 01/21/2015  . Shortness of breath dyspnea    exertion     Patient Active Problem List   Diagnosis Date Noted  . Hypothyroidism 08/10/2016  . Tobacco dependence 08/02/2016  . Anterior cervical lymphadenopathy 08/02/2016  . Throat fullness 08/02/2016  . Hypertensive urgency 08/01/2016  . Lymphedema of right lower extremity 10/04/2015  . Urinary retention with incomplete bladder emptying 02/22/2015  . Groin mass in female 02/22/2015  . Postoperative nausea and vomiting 01/21/2015  . Cervical cancer, FIGO stage IB1 ([place]) 01/19/2015  . Cervical cancer [facility])     Past Surgical History:  Procedure Laterality Date  . ABDOMINAL HYSTERECTOMY  2017  . APPENDECTOMY  1993  . CERVICAL CONE BIOPSY     x 6  . CERVICAL CONIZATION W/BX N/A 12/08/2014   Procedure: COLD KNIFE CONIZATION OF CERVIX;  Surgeon: [name], MD;  Location: [facility] ORS;  Service:  Gynecology;  Laterality: N/A;  . CESAREAN SECTION     x 2  . CHOLECYSTECTOMY  2009  . DILATION AND CURETTAGE OF UTERUS    . LYMPH NODE DISSECTION N/A 06/01/2016   Procedure: RIGHT INGUNAL LYMPH NODE DISSECTION WITH A JP DRAIN;  Surgeon: [name], MD;  Location: [facility] ORS;  Service: Gynecology;  Laterality: N/A;  . PELVIC LYMPH NODE DISSECTION N/A 01/19/2015   Procedure: PELVIC LYMPH NODE DISSECTION;  Surgeon: [name], MD;  Location: [facility];  Service: Gynecology;  Laterality: N/A;  . ROBOTIC ASSISTED TOTAL HYSTERECTOMY N/A 01/19/2015   Procedure: ROBOTIC ASSISTED RADICAL HYSTERECTOMY RIGHT TUBE AND OVARY, LEFT TUBE;  Surgeon: [name], MD;  Location: [facility];  Service: Gynecology;  Laterality: N/A;  . SHOULDER SURGERY Left   . TONSILLECTOMY      OB History    Gravida Para Term Preterm AB Living   2 2 2          SAB TAB Ectopic Multiple Live Births                   Home Medications    Prior to Admission medications   Medication Sig Start Date End Date Taking? Authorizing Provider  albuterol (ACCUNEB) 0.63 MG/3ML nebulizer solution Take 1 ampule by nebulization every 6 (six) hours as needed for Wheezing.  Yes Historical Provider, MD  albuterol (PROVENTIL HFA;VENTOLIN HFA) 108 (90 Base) MCG/ACT inhaler Inhale 2 puffs into the lungs once. 09/11/15  Yes Blanchie Dessert, MD  amLODipine (NORVASC) 5 MG tablet Take 1 tablet (5 mg total) by mouth daily. 08/01/16  Yes Charlene Brooke Nche, NP  levothyroxine (SYNTHROID, LEVOTHROID) 50 MCG tablet Take 1 tablet (50 mcg total) by mouth daily before breakfast. 08/10/16  Yes Charlene Brooke Nche, NP  lisinopril-hydrochlorothiazide (PRINZIDE,ZESTORETIC) 20-12.5 MG tablet Take 1 tablet by mouth daily. 08/15/16  Yes Charlene Brooke Nche, NP  omeprazole (PRILOSEC) 20 MG capsule Take 1 capsule (20 mg total) by mouth daily. 08/01/16  Yes Flossie Buffy, NP    Family History Family History  Problem Relation Age of Onset  . Cancer Mother 83    lymphoma,  stomach  . Hypertension Mother   . Diabetes Mother   . CAD Mother   . Diabetes Other   . Hypertension Other   . Cancer Other   . CAD Other   . Heart disease Maternal Aunt   . Hypertension Maternal Aunt     Social History Social History  Substance Use Topics  . Smoking status: Current Every Day Smoker    Packs/day: 2.00    Years: 24.00    Types: Cigarettes  . Smokeless tobacco: Never Used  . Alcohol use Yes     Comment: social     Allergies   Other; Acetaminophen; Erythromycin; Penicillins; Doxycycline; Erythromycin base; and Motrin [ibuprofen]   Review of Systems Review of Systems  Constitutional: Negative.   HENT: Negative.   Respiratory: Negative.   Cardiovascular: Negative.   Gastrointestinal: Negative.   Musculoskeletal: Positive for myalgias.  Skin: Negative.   Allergic/Immunologic: Positive for immunocompromised state.  Neurological: Negative.   Psychiatric/Behavioral: Negative.   All other systems reviewed and are negative.    Physical Exam Updated Vital Signs BP (!) 152/110 (BP Location: Right Arm) Comment: Hx of the same recently started on  new HTN meds  Pulse 95   Temp 98 F (36.7 C) (Oral)   Resp 16   Ht 5\' 6"  (1.676 m)   Wt 200 lb (90.7 kg)   LMP 01/19/2015 Comment: started after last surgery  SpO2 100%   BMI 32.28 kg/m   Physical Exam  Constitutional: She appears well-developed and well-nourished.  HENT:  Head: Normocephalic and atraumatic.  Eyes: Conjunctivae are normal. Pupils are equal, round, and reactive to light.  Neck: Neck supple. No tracheal deviation present. No thyromegaly present.  Cardiovascular: Normal rate and regular rhythm.   No murmur heard. Pulmonary/Chest: Effort normal and breath sounds normal.  Abdominal: Soft. Bowel sounds are normal. She exhibits no distension. There is no tenderness.  Musculoskeletal: Normal range of motion. She exhibits no edema or tenderness.  Right lower extremity not red or warm. Minimal  lymphedema action. DP pulse and femoral pulses 2+. Extremity is not red or warm. She is tender at the groin as well as the posterior thigh. Good capillary refill. All other extremities without redness swelling or tenderness neurovascularly intact  Neurological: She is alert. Coordination normal.  Skin: Skin is warm and dry. No rash noted.  Psychiatric: She has a normal mood and affect.  Nursing note and vitals reviewed.    ED Treatments / Results  Labs (all labs ordered are listed, but only abnormal results are displayed) Labs Reviewed  BASIC METABOLIC PANEL  CBC WITH DIFFERENTIAL/PLATELET    EKG  EKG Interpretation None     Noninvasive study  of right leg showed no evidence of DVT Results for orders placed or performed during the hospital encounter of [id number]  Basic metabolic panel  Result Value Ref Range   Sodium 138 135 - 145 mmol/L   Potassium 3.5 3.5 - 5.1 mmol/L   Chloride 105 101 - 111 mmol/L   CO2 24 22 - 32 mmol/L   Glucose, Bld 88 65 - 99 mg/dL   BUN 8 6 - 20 mg/dL   Creatinine, Ser 0.81 0.44 - 1.00 mg/dL   Calcium 9.0 8.9 - 10.3 mg/dL   GFR calc non Af Amer >60 >60 mL/min   GFR calc Af Amer >60 >60 mL/min   Anion gap 9 5 - 15  CBC with Differential/Platelet  Result Value Ref Range   WBC 9.2 4.0 - 10.5 K/uL   RBC 4.51 3.87 - 5.11 MIL/uL   Hemoglobin 13.7 12.0 - 15.0 g/dL   HCT 41.4 36.0 - 46.0 %   MCV 91.8 78.0 - 100.0 fL   MCH 30.4 26.0 - 34.0 pg   MCHC 33.1 30.0 - 36.0 g/dL   RDW 13.5 11.5 - 15.5 %   Platelets 214 150 - 400 K/uL   Neutrophils Relative % 61 %   Neutro Abs 5.7 1.7 - 7.7 K/uL   Lymphocytes Relative 31 %   Lymphs Abs 2.8 0.7 - 4.0 K/uL   Monocytes Relative 6 %   Monocytes Absolute 0.6 0.1 - 1.0 K/uL   Eosinophils Relative 2 %   Eosinophils Absolute 0.2 0.0 - 0.7 K/uL   Basophils Relative 0 %   Basophils Absolute 0.0 0.0 - 0.1 K/uL   Ct Pelvis Wo Contrast  Result Date: 09/04/2016 CLINICAL DATA:  Increasing right leg and groin pain for  the past several weeks. History of cervical cancer and pelvic surgery. EXAM: CT PELVIS WITHOUT CONTRAST TECHNIQUE: Multidetector CT imaging of the pelvis was performed following the standard protocol without intravenous contrast. COMPARISON:  CT from 01/21/2015 FINDINGS: Urinary Tract:  No abnormality visualized. Bowel: Unremarkable visualized pelvic bowel loops. Ovoid density in the right lower quadrant is may represent contrast within a right-sided diverticulum. It appears stable relative to 2016. No evidence of acute inflammatory process nor bowel obstruction. Vascular/Lymphatic: 10 mm short axis left inguinal lymph node is stable in appearance. No right lymphadenopathy. Postsurgical changes are noted in the region of the right inguinal canal. Left common iliac and branch vessel atherosclerosis. No aneurysm of the visualized vasculature. Reproductive:  Hysterectomy.  No adnexal mass. Other:  No pneumoperitoneum nor pelvic ascites. Musculoskeletal: No suspicious bone lesions identified. IMPRESSION: 1. Postsurgical scarring noted in the right inguinal region. 2. No adenopathy nor acute inflammatory process identified within the pelvis. 3. Hysterectomy.  No adnexal mass. Electronically Signed   By: Ashley Royalty M.D.   On: 09/04/2016 17:47   US Thyroid  Result Date: 08/15/2016 CLINICAL DATA:  Thyromegaly on exam EXAM: THYROID ULTRASOUND TECHNIQUE: Ultrasound examination of the thyroid gland and adjacent soft tissues was performed. COMPARISON:  None. FINDINGS: Parenchymal Echotexture: Moderately heterogenous Isthmus: 1 cm Right lobe: 6.7 x 3.4 x 3.5 cm Left lobe: 5.2 x 3.0 x 2.2 cm _________________________________________________________ Estimated total number of nodules >/= 1 cm: 0 Number of spongiform nodules >/=  2 cm not described below (TR1): 0 Number of mixed cystic and solid nodules >/= 1.5 cm not described below (TR2): 0 Markedly heterogeneous and enlarged thyroid gland without hypervascularity or discrete  measurable nodule. Appearance remains nonspecific. This could represent sequelae from prior thyroiditis.  Mildly prominent but benign appearing cervical lymph nodes measure 10 mm in short axis with preserved fatty hila. IMPRESSION: Diffusely heterogeneous enlarged thyroid gland without a discrete nodule or focal abnormality. No hypervascularity. Appearance remains nonspecific. Mildly prominent cervical lymph nodes, suspect reactive. The above is in keeping with the ACR TI-RADS recommendations - J Am Coll Radiol 2017;14:587-595. Electronically Signed   By: Jerilynn Mages.  Shick M.D.   On: 08/15/2016 17:16   Radiology No results found.  Procedures Procedures (including critical care time)  Medications Ordered in ED  6:25 PM pain improved after treatment with intravenous morphine. Patient is alert and ambulates with minimal limp favoring right leg. Medications  morphine 4 MG/ML injection 4 mg (not administered)    I discussed case at length with Dr. Denman George who states the patient suffers from chronic pain identical to what she is describing today. Workup likely to be low yield. She is trying to get patient into a pain clinic and patient tends to bounce from one emergency department to another for her chronic pain in right leg. I will prescribe hydromorphone as outpatient. Advised patient to follow up with Dr.Rossi to arrange for pain management clinic. She has appointment with PCP within the next one or 2 weeks. I have advised her to get her blood pressure rechecked  Plan / ED Course  I have reviewed the triage vital signs and the nursing notes.  Pertinent labs & imaging results that were available during my care of the patient were reviewed by me and considered in my medical decision making (see chart for details).     [place] Controlled Substance reporting System queried  Final Clinical Impressions(s) / ED Diagnoses  Diagnosis#1 acute on chronic pain in  right leg #2Elevated blood pressure Final  diagnoses:  None    New Prescriptions New Prescriptions   No medications on file     Orlie Dakin, MD 09/04/16 [place], MD 09/04/16 [phone]

## 2016-09-04 NOTE — Progress Notes (Signed)
*  PRELIMINARY RESULTS* Vascular Ultrasound Right lower extremity venous duplex has been completed.  Preliminary findings: No evidence of deep vein thrombosis or baker's cyst in the right lower extremity.   Myrtie Cruise Sachin Ferencz 09/04/2016, 6:00 PM

## 2016-09-04 NOTE — ED Notes (Addendum)
Pt agitated and refused to disrobe and get into a gown. Pt stated , "I have on shorts! Why do I need to take everything off?" Pt informed that she needs to change, but will not comply. Pt stated that she will "talk to a lawyer", if no one will see her.

## 2016-09-04 NOTE — Discharge Instructions (Signed)
Call Dr. Serita Grit office tomorrow to arrange to get into a pain clinic. Your blood pressure recheck within the next 1 or 2 weeks at your primary care physician's office. Today's was elevated at 160/117

## 2016-09-04 NOTE — ED Notes (Signed)
This RN spoke with pt and reassured her that we are looking for a room for her, this RN spoke with POD C provider who reports the pt will require a more indepth work up and is not appropriate for the fast track area, will continue to update pt

## 2016-09-05 ENCOUNTER — Telehealth: Payer: Self-pay | Admitting: Nurse Practitioner

## 2016-09-05 ENCOUNTER — Telehealth: Payer: Self-pay

## 2016-09-05 NOTE — Telephone Encounter (Signed)
Pt called to let Baldo Ash know that her BP has been high. It was 181/117 yesterday while she was at the Emergency Room (4:00pm-6:00pm). Before she left, it was 161/116. This morning at 7:30 it was 185/100 and at 12:30 today is was 165/100. The doctor at the emergency room told her to let Baldo Ash know about these blood pressure reading.  Please advise. Thanks, [employer]

## 2016-09-05 NOTE — Telephone Encounter (Signed)
Ms Billiups was instructed  by the ED physician yesterday to call Dr. Serita Grit office to have referral made to a new pain clinic.  Dr. Denman George spoke with Dr. Winfred Leeds yesterday. Ms Bugh would like a referral made to [facility] on [address] With Dr. Mirna Mires. Phone: [phone]. Fax: [phone]  Attention Jasmine. Records sent to pain clinic attention Lewis. Jasmine will call patient with appointment.

## 2016-09-05 NOTE — Telephone Encounter (Signed)
Pt need to come in for an appt--per [facility]. Please call pt and schedule an appt sooner that 09/12/2016.

## 2016-09-06 NOTE — Telephone Encounter (Signed)
Called patient she has an appointment on march 8th at 65am.

## 2016-09-07 ENCOUNTER — Encounter: Payer: Self-pay | Admitting: Nurse Practitioner

## 2016-09-07 ENCOUNTER — Ambulatory Visit (INDEPENDENT_AMBULATORY_CARE_PROVIDER_SITE_OTHER): Payer: Medicare Other | Admitting: Nurse Practitioner

## 2016-09-07 VITALS — BP 160/104 | HR 78 | Temp 98.0°F | Ht 66.5 in | Wt 193.0 lb

## 2016-09-07 DIAGNOSIS — F419 Anxiety disorder, unspecified: Secondary | ICD-10-CM | POA: Diagnosis not present

## 2016-09-07 DIAGNOSIS — I160 Hypertensive urgency: Secondary | ICD-10-CM | POA: Diagnosis not present

## 2016-09-07 DIAGNOSIS — F39 Unspecified mood [affective] disorder: Secondary | ICD-10-CM | POA: Diagnosis not present

## 2016-09-07 DIAGNOSIS — F339 Major depressive disorder, recurrent, unspecified: Secondary | ICD-10-CM | POA: Insufficient documentation

## 2016-09-07 MED ORDER — LISINOPRIL-HYDROCHLOROTHIAZIDE 20-12.5 MG PO TABS: 1.0000 | ORAL_TABLET | Freq: Every day | ORAL | 1 refills | Status: AC

## 2016-09-07 MED ORDER — SERTRALINE HCL 50 MG PO TABS: 50.0000 mg | ORAL_TABLET | Freq: Every day | ORAL | 1 refills | Status: AC

## 2016-09-07 MED ORDER — AMLODIPINE BESYLATE 10 MG PO TABS: 10.0000 mg | ORAL_TABLET | Freq: Every day | ORAL | 1 refills | Status: AC

## 2016-09-07 NOTE — Progress Notes (Signed)
Pre visit review using our clinic review tool, if applicable. No additional management support is needed unless otherwise documented below in the visit note. 

## 2016-09-07 NOTE — Patient Instructions (Addendum)
She declined use of Wellbutrin or cymbalta.  I instructed pt to start 1/2 tablet once daily for 1 week and then increase to a full tablet once daily on week two as tolerated.   We discussed common side effects such as nausea, drowsiness and weight gain.  Also discussed rare but serious side effect of suicide ideation.  She is instructed to discontinue medication go directly to ED if this occurs.  Pt verbalizes understanding.   Plan follow up in 1 month to evaluate progress.    Schedule follow up appt with [facility] asap.  I strongly recommend for you to resume medications and to stop tobacco use.  Hypertension Hypertension, commonly called high blood pressure, is when the force of blood pumping through the arteries is too strong. The arteries are the blood vessels that carry blood from the heart throughout the body. Hypertension forces the heart to work harder to pump blood and may cause arteries to become narrow or stiff. Having untreated or uncontrolled hypertension can cause heart attacks, strokes, kidney disease, and other problems. A blood pressure reading consists of a higher number over a lower number. Ideally, your blood pressure should be below 120/80. The first ("top") number is called the systolic pressure. It is a measure of the pressure in your arteries as your heart beats. The second ("bottom") number is called the diastolic pressure. It is a measure of the pressure in your arteries as the heart relaxes. What are the causes? The cause of this condition is not known. What increases the risk? Some risk factors for high blood pressure are under your control. Others are not. Factors you can change   Smoking.  Having type 2 diabetes mellitus, high cholesterol, or both.  Not getting enough exercise or physical activity.  Being overweight.  Having too much fat, sugar, calories, or salt (sodium) in your diet.  Drinking too much alcohol. Factors that are difficult or impossible to  change   Having chronic kidney disease.  Having a family history of high blood pressure.  Age. Risk increases with age.  Race. You may be at higher risk if you are African-American.  Gender. Men are at higher risk than women before age 99. After age 16, women are at higher risk than men.  Having obstructive sleep apnea.  Stress. What are the signs or symptoms? Extremely high blood pressure (hypertensive crisis) may cause:  Headache.  Anxiety.  Shortness of breath.  Nosebleed.  Nausea and vomiting.  Severe chest pain.  Jerky movements you cannot control (seizures). How is this diagnosed? This condition is diagnosed by measuring your blood pressure while you are seated, with your arm resting on a surface. The cuff of the blood pressure monitor will be placed directly against the skin of your upper arm at the level of your heart. It should be measured at least twice using the same arm. Certain conditions can cause a difference in blood pressure between your right and left arms. Certain factors can cause blood pressure readings to be lower or higher than normal (elevated) for a short period of time:  When your blood pressure is higher when you are in a health care provider's office than when you are at home, this is called white coat hypertension. Most people with this condition do not need medicines.  When your blood pressure is higher at home than when you are in a health care provider's office, this is called masked hypertension. Most people with this condition may need medicines to  control blood pressure. If you have a high blood pressure reading during one visit or you have normal blood pressure with other risk factors:  You may be asked to return on a different day to have your blood pressure checked again.  You may be asked to monitor your blood pressure at home for 1 week or longer. If you are diagnosed with hypertension, you may have other blood or imaging tests to  help your health care provider understand your overall risk for other conditions. How is this treated? This condition is treated by making healthy lifestyle changes, such as eating healthy foods, exercising more, and reducing your alcohol intake. Your health care provider may prescribe medicine if lifestyle changes are not enough to get your blood pressure under control, and if:  Your systolic blood pressure is above 130.  Your diastolic blood pressure is above 80. Your personal target blood pressure may vary depending on your medical conditions, your age, and other factors. Follow these instructions at home: Eating and drinking   Eat a diet that is high in fiber and potassium, and low in sodium, added sugar, and fat. An example eating plan is called the DASH (Dietary Approaches to Stop Hypertension) diet. To eat this way:  Eat plenty of fresh fruits and vegetables. Try to fill half of your plate at each meal with fruits and vegetables.  Eat whole grains, such as whole wheat pasta, brown rice, or whole grain bread. Fill about one quarter of your plate with whole grains.  Eat or drink low-fat dairy products, such as skim milk or low-fat yogurt.  Avoid fatty cuts of meat, processed or cured meats, and poultry with skin. Fill about one quarter of your plate with lean proteins, such as fish, chicken without skin, beans, eggs, and tofu.  Avoid premade and processed foods. These tend to be higher in sodium, added sugar, and fat.  Reduce your daily sodium intake. Most people with hypertension should eat less than 1,500 mg of sodium a day.  Limit alcohol intake to no more than 1 drink a day for nonpregnant women and 2 drinks a day for men. One drink equals 12 oz of beer, 5 oz of wine, or 1 oz of hard liquor. Lifestyle   Work with your health care provider to maintain a healthy body weight or to lose weight. Ask what an ideal weight is for you.  Get at least 30 minutes of exercise that causes  your heart to beat faster (aerobic exercise) most days of the week. Activities may include walking, swimming, or biking.  Include exercise to strengthen your muscles (resistance exercise), such as pilates or lifting weights, as part of your weekly exercise routine. Try to do these types of exercises for 30 minutes at least 3 days a week.  Do not use any products that contain nicotine or tobacco, such as cigarettes and e-cigarettes. If you need help quitting, ask your health care provider.  Monitor your blood pressure at home as told by your health care provider.  Keep all follow-up visits as told by your health care provider. This is important. Medicines   Take over-the-counter and prescription medicines only as told by your health care provider. Follow directions carefully. Blood pressure medicines must be taken as prescribed.  Do not skip doses of blood pressure medicine. Doing this puts you at risk for problems and can make the medicine less effective.  Ask your health care provider about side effects or reactions to medicines that you  should watch for. Contact a health care provider if:  You think you are having a reaction to a medicine you are taking.  You have headaches that keep coming back (recurring).  You feel dizzy.  You have swelling in your ankles.  You have trouble with your vision. Get help right away if:  You develop a severe headache or confusion.  You have unusual weakness or numbness.  You feel faint.  You have severe pain in your chest or abdomen.  You vomit repeatedly.  You have trouble breathing. Summary  Hypertension is when the force of blood pumping through your arteries is too strong. If this condition is not controlled, it may put you at risk for serious complications.  Your personal target blood pressure may vary depending on your medical conditions, your age, and other factors. For most people, a normal blood pressure is less than  120/80.  Hypertension is treated with lifestyle changes, medicines, or a combination of both. Lifestyle changes include weight loss, eating a healthy, low-sodium diet, exercising more, and limiting alcohol. This information is not intended to replace advice given to you by your health care provider. Make sure you discuss any questions you have with your health care provider. Document Released: 06/19/2005 Document Revised: 05/17/2016 Document Reviewed: 05/17/2016 Elsevier Interactive Patient Education  2017 [employer].

## 2016-09-07 NOTE — Progress Notes (Signed)
Subjective:  Patient ID: Melissa Alvarez, female    DOB: 03-31-1976  Age: 41 y.o. MRN: [id number]  CC: Follow-up (follow after hospital/BP/headache?)   Depression       The patient presents with depression.  This is a chronic problem.  The current episode started more than 1 year ago.   The onset quality is undetermined.   The problem occurs constantly.  The problem has been waxing and waning since onset.  Associated symptoms include decreased concentration, fatigue, irritable, decreased interest, appetite change, body aches, myalgias, headaches and sad.  Associated symptoms include no helplessness, no hopelessness, does not have insomnia, no restlessness, no indigestion and no suicidal ideas.     Exacerbated by: health.  Past treatments include SSRIs - Selective serotonin reuptake inhibitors and psychotherapy.  Compliance with treatment is poor.  Past compliance problems include difficulty with treatment plan and medical issues.  Risk factors include stress, history of mental illness, a recent illness and a change in medications.   Past medical history includes chronic fatigue syndrome, chronic pain, hypothyroidism, thyroid problem, chronic illness, anxiety, bipolar disorder, depression and mental health disorder.     Pertinent negatives include no suicide attempts and no head trauma. last seen by [facility] psychiatrist 05/2016. She states she stopped going because she did not want to take multiple medications. She denies any ETOH or illicit drug use.  HTN: She stopped taking medications 1week ago because BP was still elevated. She reports that her medications are dispensed by her daughter and fiancee.  Outpatient Medications Prior to Visit  Medication Sig Dispense Refill  . albuterol (ACCUNEB) 0.63 MG/3ML nebulizer solution Take 1 ampule by nebulization every 6 (six) hours as needed for Wheezing.    Marland Kitchen albuterol (PROVENTIL HFA;VENTOLIN HFA) 108 (90 Base) MCG/ACT inhaler Inhale 2 puffs into the  lungs once. 1 Inhaler 1  . HYDROmorphone (DILAUDID) 4 MG tablet Take 1 tablet (4 mg total) by mouth every 4 (four) hours as needed for severe pain. 20 tablet 0  . levothyroxine (SYNTHROID, LEVOTHROID) 50 MCG tablet Take 1 tablet (50 mcg total) by mouth daily before breakfast. 30 tablet 2  . omeprazole (PRILOSEC) 20 MG capsule Take 1 capsule (20 mg total) by mouth daily. 30 capsule 3  . amLODipine (NORVASC) 5 MG tablet Take 1 tablet (5 mg total) by mouth daily. 30 tablet 2  . lisinopril-hydrochlorothiazide (PRINZIDE,ZESTORETIC) 20-12.5 MG tablet Take 1 tablet by mouth daily. 30 tablet 3   No facility-administered medications prior to visit.     ROS See HPI  Objective:  BP (!) 160/104   Pulse 78   Temp 98 F (36.7 C)   Ht 5' 6.5" (1.689 m)   Wt 193 lb (87.5 kg)   LMP 01/19/2015 Comment: started after last surgery  SpO2 99%   BMI 30.68 kg/m   BP Readings from Last 3 Encounters:  09/07/16 (!) 160/104  09/04/16 163/97  08/15/16 (!) 164/100    Wt Readings from Last 3 Encounters:  09/07/16 193 lb (87.5 kg)  09/04/16 200 lb (90.7 kg)  08/15/16 200 lb (90.7 kg)    Physical Exam  Constitutional: She is irritable.  Physical Exam  Constitutional: She is oriented to person, place, and time.  Neck: Normal range of motion. Neck supple.  Cardiovascular: Normal rate and normal heart sounds.   Pulmonary/Chest: Effort normal and breath sounds normal.  Musculoskeletal: Normal range of motion. She exhibits no edema.  Neurological: She is alert and oriented to person, place, and time.  Gait normal.  Skin: Skin is warm and dry.  Vitals reviewed.   Lab Results  Component Value Date   WBC 9.2 09/04/2016   HGB 13.7 09/04/2016   HCT 41.4 09/04/2016   PLT 214 09/04/2016   GLUCOSE 88 09/04/2016   CHOL 148 08/08/2016   TRIG 116.0 08/08/2016   HDL 39.80 08/08/2016   LDLCALC 85 08/08/2016   ALT 14 08/08/2016   AST 19 08/08/2016   NA 138 09/04/2016   K 3.5 09/04/2016   CL 105  09/04/2016   CREATININE 0.81 09/04/2016   BUN 8 09/04/2016   CO2 24 09/04/2016   TSH 9.49 (H) 08/08/2016   INR 1.02 04/05/2016    Ct Pelvis Wo Contrast  Result Date: 09/04/2016 CLINICAL DATA:  Increasing right leg and groin pain for the past several weeks. History of cervical cancer and pelvic surgery. EXAM: CT PELVIS WITHOUT CONTRAST TECHNIQUE: Multidetector CT imaging of the pelvis was performed following the standard protocol without intravenous contrast. COMPARISON:  CT from 01/21/2015 FINDINGS: Urinary Tract:  No abnormality visualized. Bowel: Unremarkable visualized pelvic bowel loops. Ovoid density in the right lower quadrant is may represent contrast within a right-sided diverticulum. It appears stable relative to 2016. No evidence of acute inflammatory process nor bowel obstruction. Vascular/Lymphatic: 10 mm short axis left inguinal lymph node is stable in appearance. No right lymphadenopathy. Postsurgical changes are noted in the region of the right inguinal canal. Left common iliac and branch vessel atherosclerosis. No aneurysm of the visualized vasculature. Reproductive:  Hysterectomy.  No adnexal mass. Other:  No pneumoperitoneum nor pelvic ascites. Musculoskeletal: No suspicious bone lesions identified. IMPRESSION: 1. Postsurgical scarring noted in the right inguinal region. 2. No adenopathy nor acute inflammatory process identified within the pelvis. 3. Hysterectomy.  No adnexal mass. Electronically Signed   By: Ashley Royalty M.D.   On: 09/04/2016 17:47    Assessment & Plan:   Melissa Alvarez was seen today for follow-up.  Diagnoses and all orders for this visit:  Depression, recurrent ([place]) -     sertraline (ZOLOFT) 50 MG tablet; Take 1 tablet (50 mg total) by mouth daily.  Hypertensive urgency -     Ambulatory referral to Cardiology -     lisinopril-hydrochlorothiazide (PRINZIDE,ZESTORETIC) 20-12.5 MG tablet; Take 1 tablet by mouth daily. -     amLODipine (NORVASC) 10 MG tablet; Take 1  tablet (10 mg total) by mouth daily.  Anxiety -     sertraline (ZOLOFT) 50 MG tablet; Take 1 tablet (50 mg total) by mouth daily.  Mood disorder ([place])   I have discontinued Ms. Fellner's amLODipine. I am also having her start on amLODipine and sertraline. Additionally, I am having her maintain her albuterol, albuterol, omeprazole, levothyroxine, HYDROmorphone, and lisinopril-hydrochlorothiazide.  Meds ordered this encounter  Medications  . lisinopril-hydrochlorothiazide (PRINZIDE,ZESTORETIC) 20-12.5 MG tablet    Sig: Take 1 tablet by mouth daily.    Dispense:  90 tablet    Refill:  1    Order Specific Question:   Supervising Provider    Answer:   Cassandria Anger [[id number]]  . amLODipine (NORVASC) 10 MG tablet    Sig: Take 1 tablet (10 mg total) by mouth daily.    Dispense:  90 tablet    Refill:  1    Order Specific Question:   Supervising Provider    Answer:   Cassandria Anger [[id number]]  . sertraline (ZOLOFT) 50 MG tablet    Sig: Take 1 tablet (50 mg total) by mouth daily.  Dispense:  90 tablet    Refill:  1    Order Specific Question:   Supervising Provider    Answer:   Cassandria Anger [[id number]]    Follow-up: Return in about 2 weeks (around 09/21/2016) for HTN and depression.  Wilfred Lacy, NP

## 2016-09-12 ENCOUNTER — Telehealth: Payer: Self-pay | Admitting: *Deleted

## 2016-09-12 ENCOUNTER — Ambulatory Visit: Payer: Medicare Other | Admitting: Nurse Practitioner

## 2016-09-12 NOTE — Telephone Encounter (Signed)
Dr Plumber's office has scheduled an appt with patient on Nov 08, 2016 @ 2:00pm

## 2016-09-21 ENCOUNTER — Ambulatory Visit: Payer: Medicare Other | Admitting: Nurse Practitioner

## 2016-09-21 DIAGNOSIS — Z0289 Encounter for other administrative examinations: Secondary | ICD-10-CM

## 2016-09-22 ENCOUNTER — Encounter: Payer: Self-pay | Admitting: Cardiovascular Disease

## 2016-09-22 ENCOUNTER — Encounter (INDEPENDENT_AMBULATORY_CARE_PROVIDER_SITE_OTHER): Payer: Self-pay

## 2016-09-22 ENCOUNTER — Ambulatory Visit (INDEPENDENT_AMBULATORY_CARE_PROVIDER_SITE_OTHER): Payer: Medicare Other | Admitting: Cardiovascular Disease

## 2016-09-22 VITALS — BP 180/100 | HR 78 | Ht 66.0 in | Wt 200.0 lb

## 2016-09-22 DIAGNOSIS — I119 Hypertensive heart disease without heart failure: Secondary | ICD-10-CM | POA: Diagnosis not present

## 2016-09-22 DIAGNOSIS — I130 Hypertensive heart and chronic kidney disease with heart failure and stage 1 through stage 4 chronic kidney disease, or unspecified chronic kidney disease: Secondary | ICD-10-CM | POA: Insufficient documentation

## 2016-09-22 DIAGNOSIS — I10 Essential (primary) hypertension: Secondary | ICD-10-CM

## 2016-09-22 MED ORDER — SPIRONOLACTONE 25 MG PO TABS: 25.0000 mg | ORAL_TABLET | Freq: Every day | ORAL | 11 refills | Status: AC

## 2016-09-22 NOTE — Progress Notes (Signed)
Cardiology Office Note   Date:  09/22/2016   ID:  [name], DOB 11-Jan-1976, MRN [id number]  PCP:  Wilfred Lacy, NP  Cardiologist:   Mertie Moores, MD   Chief Complaint  Patient presents with  . Hypertension     Problem List 1. Essential HTN 2. Cervical cancer  3. COPD   History of Present Illness: Melissa Alvarez is a 41 y.o. female who presents for eval and management of her hypertension.  Was recently found to have HTN. Has not been to a doctor in years.    Was diagnosed with cervical cancer 20 years  Had a hysterectomy .  Does not get exercise. Eats fast foods on occasion . Avoids salt for the most part .  Smokes 1/2 ppd   Past Medical History:  Diagnosis Date  . Anemia   . Asthma   . Cancer ([place])    cervical  . COPD (chronic obstructive pulmonary disease) ([place])   . Depression   . Difficulty sleeping   . Difficulty sleeping   . Family history of adverse reaction to anesthesia    pts daughter and pts mother awaken during surgery;   Marland Kitchen Gastric ulcer    x 5  . History of blood transfusion    last one 2 years ago   . Numbness and tingling    right hip area following June 2016 surgery pt states has improved   . Postoperative nausea and vomiting 01/21/2015  . Shortness of breath dyspnea    exertion     Past Surgical History:  Procedure Laterality Date  . ABDOMINAL HYSTERECTOMY  2017  . APPENDECTOMY  1993  . CERVICAL CONE BIOPSY     x 6  . CERVICAL CONIZATION W/BX N/A 12/08/2014   Procedure: COLD KNIFE CONIZATION OF CERVIX;  Surgeon: [name], MD;  Location: [facility];  Service: Gynecology;  Laterality: N/A;  . CESAREAN SECTION     x 2  . CHOLECYSTECTOMY  2009  . DILATION AND CURETTAGE OF UTERUS    . LYMPH NODE DISSECTION N/A 06/01/2016   Procedure: RIGHT INGUNAL LYMPH NODE DISSECTION WITH A JP DRAIN;  Surgeon: [name], MD;  Location: [facility];  Service: Gynecology;  Laterality: N/A;  . PELVIC LYMPH NODE DISSECTION N/A 01/19/2015   Procedure: PELVIC LYMPH NODE DISSECTION;  Surgeon: [name], MD;  Location: [facility];  Service: Gynecology;  Laterality: N/A;  . ROBOTIC ASSISTED TOTAL HYSTERECTOMY N/A 01/19/2015   Procedure: ROBOTIC ASSISTED RADICAL HYSTERECTOMY RIGHT TUBE AND OVARY, LEFT TUBE;  Surgeon: [name], MD;  Location: [facility];  Service: Gynecology;  Laterality: N/A;  . SHOULDER SURGERY Left   . TONSILLECTOMY       Current Outpatient Prescriptions  Medication Sig Dispense Refill  . albuterol (ACCUNEB) 0.63 MG/3ML nebulizer solution Take 1 ampule by nebulization every 6 (six) hours as needed for Wheezing.    Marland Kitchen albuterol (PROVENTIL HFA;VENTOLIN HFA) 108 (90 Base) MCG/ACT inhaler Inhale 2 puffs into the lungs once. 1 Inhaler 1  . amLODipine (NORVASC) 10 MG tablet Take 1 tablet (10 mg total) by mouth daily. 90 tablet 1  . HYDROmorphone (DILAUDID) 4 MG tablet Take 1 tablet (4 mg total) by mouth every 4 (four) hours as needed for severe pain. 20 tablet 0  . levothyroxine (SYNTHROID, LEVOTHROID) 50 MCG tablet Take 1 tablet (50 mcg total) by mouth daily before breakfast. 30 tablet 2  . lisinopril-hydrochlorothiazide (PRINZIDE,ZESTORETIC) 20-12.5 MG tablet Take 1 tablet by mouth daily. 90 tablet  1  . omeprazole (PRILOSEC) 20 MG capsule Take 1 capsule (20 mg total) by mouth daily. 30 capsule 3  . sertraline (ZOLOFT) 50 MG tablet Take 1 tablet (50 mg total) by mouth daily. 90 tablet 1   No current facility-administered medications for this visit.     No flowsheet data found.    Allergies:   Other; Acetaminophen; Erythromycin; Penicillins; Doxycycline; Erythromycin base; and Motrin [ibuprofen]    Social History:  The patient  reports that she has been smoking Cigarettes.  She has a 48.00 pack-year smoking history. She has never used smokeless tobacco. She reports that she drinks alcohol. She reports that she uses drugs, including Marijuana.   Family History:  The patient's family history includes CAD in her mother and  other; Cancer in her other; Cancer (age of onset: 8) in her mother; Diabetes in her mother and other; Heart disease in her maternal aunt; Hypertension in her maternal aunt, mother, and other.    ROS:  Please see the history of present illness.    Review of Systems: Constitutional:  denies fever, chills, diaphoresis, appetite change and fatigue.  HEENT: denies photophobia, eye pain, redness, hearing loss, ear pain, congestion, sore throat, rhinorrhea, sneezing, neck pain, neck stiffness and tinnitus.  Respiratory: denies SOB, DOE, cough, chest tightness, and wheezing.  Cardiovascular: denies chest pain, palpitations and leg swelling.  Gastrointestinal: denies nausea, vomiting, abdominal pain, diarrhea, constipation, blood in stool.  Genitourinary: denies dysuria, urgency, frequency, hematuria, flank pain and difficulty urinating.  Musculoskeletal: denies  myalgias, back pain, joint swelling, arthralgias and gait problem.   Skin: denies pallor, rash and wound.  Neurological: denies dizziness, seizures, syncope, weakness, light-headedness, numbness and headaches.   Hematological: denies adenopathy, easy bruising, personal or family bleeding history.  Psychiatric/ Behavioral: denies suicidal ideation, mood changes, confusion, nervousness, sleep disturbance and agitation.       All other systems are reviewed and negative.    PHYSICAL EXAM: VS:  BP (!) 180/100   Pulse 78   Ht 5\' 6"  (1.676 m)   Wt 200 lb (90.7 kg)   LMP 01/19/2015 Comment: started after last surgery  SpO2 96%   BMI 32.28 kg/m  , BMI Body mass index is 32.28 kg/m. GEN: Well nourished, mildly obese female  in no acute distress  HEENT:   Neck is supple ,   Neck: no JVD, carotid bruits, Neck mass ( ? Goiter )  larger in midline and on right side , tender to the touch Cardiac: RRR; no murmurs, rubs, or gallops,no edema  Respiratory:  + wheezing anteriorly  GI: soft, nontender, nondistended, + BS MS: no deformity or  atrophy  Skin: warm and dry, no rash Neuro:  Strength and sensation are intact Psych: normal   EKG:  EKG is ordered today. September 22, 2016  - NSR 78, NS ST abn.    Recent Labs: 08/08/2016: ALT 14; TSH 9.49 09/04/2016: BUN 8; Creatinine, Ser 0.81; Hemoglobin 13.7; Platelets 214; Potassium 3.5; Sodium 138    Lipid Panel    Component Value Date/Time   CHOL 148 08/08/2016 0916   TRIG 116.0 08/08/2016 0916   HDL 39.80 08/08/2016 0916   CHOLHDL 4 08/08/2016 0916   VLDL 23.2 08/08/2016 0916   LDLCALC 85 08/08/2016 0916      Wt Readings from Last 3 Encounters:  09/22/16 200 lb (90.7 kg)  09/07/16 193 lb (87.5 kg)  09/04/16 200 lb (90.7 kg)      Other studies Reviewed: Additional studies/  records that were reviewed today include: . Review of the above records demonstrates:    ASSESSMENT AND PLAN:  1.  Hypertensive heart disease   :  I suspect she's had hypertension for a long time. She has EKG changes that are consistent with left ventricular hypertrophy. BP is still elevated - on Amlodipine 10 mg a day , Lisinopril  -HCTZ  Will add Aldactone 25 mg a day  nurse visit in 1-2 weeks with BMP  Will see me in 3 months  Advised her to lose weight and exercise     Current medicines are reviewed at length with the patient today.  The patient does not have concerns regarding medicines.  Labs/ tests ordered today include:  No orders of the defined types were placed in this encounter.    Disposition:   FU with [place] in 1-2 weeks for nurse visit / BMP  I will see her in 3 months      Mertie Moores, MD  09/22/2016 8:11 AM    [address] [place], [place], [place]  [address] Phone: [phone]; Fax: [phone]

## 2016-09-22 NOTE — Patient Instructions (Addendum)
Medication Instructions:  START Aldactone (Spironolactone) 25 mg once daily   Labwork: Your physician recommends that you return for lab work in: 3 weeks on the same day as your Nurse visit   Testing/Procedures: Your physician has requested that you have an echocardiogram. Echocardiography is a painless test that uses sound waves to create images of your heart. It provides your doctor with information about the size and shape of your heart and how well your heart's chambers and valves are working. This procedure takes approximately one hour. There are no restrictions for this procedure.    Follow-Up: Your physician recommends that you return for a follow-up appointment on Wed. April 4 at 2:00 pm for BP Check/Nurse Visit  Your physician wants you to follow-up in: 3 months with Dr. Acie Fredrickson.  You will receive a reminder letter in the mail two months in advance. If you don't receive a letter, please call our office to schedule the follow-up appointment.    If you need a refill on your cardiac medications before your next appointment, please call your pharmacy.   Thank you for choosing [facility]! Christen Bame, RN [phone]

## 2016-09-24 ENCOUNTER — Emergency Department (HOSPITAL_COMMUNITY): Payer: Medicare Other

## 2016-09-24 ENCOUNTER — Emergency Department (HOSPITAL_COMMUNITY)
Admission: EM | Admit: 2016-09-24 | Discharge: 2016-09-24 | Discharge status: Absent without leave | Payer: Medicare Other

## 2016-09-24 ENCOUNTER — Emergency Department (HOSPITAL_COMMUNITY)
Admission: EM | Admit: 2016-09-24 | Discharge: 2016-09-24 | Disposition: A | Discharge status: Home / Self Care | Payer: Medicare Other | Attending: Emergency Medicine | Admitting: Emergency Medicine

## 2016-09-24 ENCOUNTER — Encounter (HOSPITAL_COMMUNITY): Payer: Self-pay | Admitting: Emergency Medicine

## 2016-09-24 DIAGNOSIS — F17210 Nicotine dependence, cigarettes, uncomplicated: Secondary | ICD-10-CM | POA: Insufficient documentation

## 2016-09-24 DIAGNOSIS — R0602 Shortness of breath: Secondary | ICD-10-CM | POA: Diagnosis not present

## 2016-09-24 DIAGNOSIS — Z8541 Personal history of malignant neoplasm of cervix uteri: Secondary | ICD-10-CM | POA: Insufficient documentation

## 2016-09-24 DIAGNOSIS — Z79899 Other long term (current) drug therapy: Secondary | ICD-10-CM | POA: Insufficient documentation

## 2016-09-24 DIAGNOSIS — J4521 Mild intermittent asthma with (acute) exacerbation: Secondary | ICD-10-CM

## 2016-09-24 DIAGNOSIS — N189 Chronic kidney disease, unspecified: Secondary | ICD-10-CM | POA: Insufficient documentation

## 2016-09-24 DIAGNOSIS — R05 Cough: Secondary | ICD-10-CM | POA: Diagnosis not present

## 2016-09-24 DIAGNOSIS — I130 Hypertensive heart and chronic kidney disease with heart failure and stage 1 through stage 4 chronic kidney disease, or unspecified chronic kidney disease: Secondary | ICD-10-CM | POA: Diagnosis not present

## 2016-09-24 DIAGNOSIS — I509 Heart failure, unspecified: Secondary | ICD-10-CM | POA: Insufficient documentation

## 2016-09-24 DIAGNOSIS — J449 Chronic obstructive pulmonary disease, unspecified: Secondary | ICD-10-CM | POA: Insufficient documentation

## 2016-09-24 DIAGNOSIS — E039 Hypothyroidism, unspecified: Secondary | ICD-10-CM | POA: Diagnosis not present

## 2016-09-24 DIAGNOSIS — Z716 Tobacco abuse counseling: Secondary | ICD-10-CM

## 2016-09-24 LAB — CBC WITH DIFFERENTIAL/PLATELET
BASOS PCT: 0 %
Basophils Absolute: 0 10*3/uL (ref 0.0–0.1)
EOS ABS: 0.2 10*3/uL (ref 0.0–0.7)
EOS PCT: 2 %
HCT: 42 % (ref 36.0–46.0)
HEMOGLOBIN: 13.6 g/dL (ref 12.0–15.0)
Lymphocytes Relative: 21 %
Lymphs Abs: 1.8 10*3/uL (ref 0.7–4.0)
MCH: 30.4 pg (ref 26.0–34.0)
MCHC: 32.4 g/dL (ref 30.0–36.0)
MCV: 94 fL (ref 78.0–100.0)
MONO ABS: 0.6 10*3/uL (ref 0.1–1.0)
MONOS PCT: 7 %
NEUTROS PCT: 70 %
Neutro Abs: 6 10*3/uL (ref 1.7–7.7)
Platelets: 230 10*3/uL (ref 150–400)
RBC: 4.47 MIL/uL (ref 3.87–5.11)
RDW: 14.1 % (ref 11.5–15.5)
WBC: 8.6 10*3/uL (ref 4.0–10.5)

## 2016-09-24 LAB — BASIC METABOLIC PANEL
Anion gap: 11 (ref 5–15)
CALCIUM: 8.9 mg/dL (ref 8.9–10.3)
CO2: 24 mmol/L (ref 22–32)
Chloride: 102 mmol/L (ref 101–111)
Creatinine, Ser: 0.76 mg/dL (ref 0.44–1.00)
GLUCOSE: 89 mg/dL (ref 65–99)
Potassium: 3.7 mmol/L (ref 3.5–5.1)
SODIUM: 137 mmol/L (ref 135–145)

## 2016-09-24 LAB — I-STAT TROPONIN, ED: Troponin i, poc: 0 ng/mL (ref 0.00–0.08)

## 2016-09-24 LAB — I-STAT BETA HCG BLOOD, ED (MC, WL, AP ONLY): I-stat hCG, quantitative: <5 m[IU]/mL (ref <5)

## 2016-09-24 MED ORDER — ALBUTEROL SULFATE HFA 108 (90 BASE) MCG/ACT IN AERS: 2.0000 | INHALATION_SPRAY | RESPIRATORY_TRACT | 3 refills | Status: AC | PRN

## 2016-09-24 MED ORDER — IPRATROPIUM-ALBUTEROL 0.5-2.5 (3) MG/3ML IN SOLN
3.0000 mL | Freq: Once | RESPIRATORY_TRACT | Status: AC
Start: 2016-09-24 — End: 2016-09-24
  Administered 2016-09-24: 3 mL via RESPIRATORY_TRACT
  Filled 2016-09-24: qty 3

## 2016-09-24 MED ORDER — ALBUTEROL SULFATE HFA 108 (90 BASE) MCG/ACT IN AERS
4.0000 | INHALATION_SPRAY | Freq: Once | RESPIRATORY_TRACT | Status: AC
Start: 2016-09-24 — End: 2016-09-24
  Administered 2016-09-24: 4 via RESPIRATORY_TRACT
  Filled 2016-09-24: qty 6.7

## 2016-09-24 MED ORDER — DEXAMETHASONE 6 MG PO TABS: 12.0000 mg | ORAL_TABLET | Freq: Once | ORAL | 0 refills | Status: AC

## 2016-09-24 MED ORDER — DEXAMETHASONE 4 MG PO TABS
10.0000 mg | ORAL_TABLET | Freq: Once | ORAL | Status: AC
Start: 2016-09-24 — End: 2016-09-24
  Administered 2016-09-24: 10 mg via ORAL
  Filled 2016-09-24: qty 3

## 2016-09-24 MED ORDER — PREDNISONE 20 MG PO TABS: 60.0000 mg | ORAL_TABLET | Freq: Once | ORAL | Status: DC

## 2016-09-24 NOTE — ED Triage Notes (Signed)
Pt c/o asthma flare up x 2 weeks. Pt seen by PMD for same and was told to come to ED if she continues to wheeze. Pt has inspiratory wheezing.

## 2016-09-24 NOTE — ED Triage Notes (Deleted)
Pt c/o asthma flare up onset 09/15/2016. Pt seen by PMD for same and was told to come to ED if she continues to wheeze. Pt presents with inspiratory wheezing. Pt also vomiting x 3 days.

## 2016-09-24 NOTE — ED Notes (Signed)
Pt is in stable condition upon d/c and ambulates from ED. 

## 2016-09-24 NOTE — Discharge Instructions (Signed)
Continue to work on quitting smoking. This will make your breathing better.  Return for worsening symptoms, including difficulty breathing, passing out or any other symptoms concerning to you.  Take second dose of steroids in 3 days if you continue to feel short of breath.

## 2016-09-24 NOTE — ED Provider Notes (Addendum)
[facility] DEPT Provider Note   CSN: [id number] Arrival date & time: 09/24/16  1555     History   Chief Complaint Chief Complaint  Patient presents with  . Asthma    HPI Melissa Alvarez is a 41 y.o. female.  The history is provided by the patient.  Asthma  This is a recurrent problem. The current episode started more than 1 week ago. The problem occurs constantly. The problem has been gradually worsening. Associated symptoms include chest pain and shortness of breath. Pertinent negatives include no abdominal pain. The symptoms are aggravated by walking. Nothing relieves the symptoms. She has tried nothing for the symptoms.   41 year old female who presents with shortness of breath. She has a history of asthma and chronic tobacco use. States onset of wheezing and shortness of breath that has been ongoing for one week. Associated with chest tightness and pressure. States that this feels like her asthma exacerbation. Has been having a nonproductive cough. Denies any fevers, chills, calf tenderness, new swelling in the legs, recent immobilization. Has not tried any medications at home as her inhalers had ran out. Not sure when she was last treated with steroids.   Past Medical History:  Diagnosis Date  . Anemia   . Asthma   . Cancer ([place])    cervical  . COPD (chronic obstructive pulmonary disease) ([place])   . Depression   . Difficulty sleeping   . Difficulty sleeping   . Family history of adverse reaction to anesthesia    pts daughter and pts mother awaken during surgery;   Marland Kitchen Gastric ulcer    x 5  . History of blood transfusion    last one 2 years ago   . Numbness and tingling    right hip area following June 2016 surgery pt states has improved   . Postoperative nausea and vomiting 01/21/2015  . Shortness of breath dyspnea    exertion     Patient Active Problem List   Diagnosis Date Noted  . 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 Mood disorder ([place]) 09/07/2016  . Anxiety 09/07/2016  . Depression, recurrent ([place]) 09/07/2016  . Hypothyroidism 08/10/2016  . Tobacco dependence 08/02/2016  . Anterior cervical lymphadenopathy 08/02/2016  . Throat fullness 08/02/2016  . Hypertensive urgency 08/01/2016  . Lymphedema of right lower extremity 10/04/2015  . Urinary retention with incomplete bladder emptying 02/22/2015  . Groin mass in female 02/22/2015  . Postoperative nausea and vomiting 01/21/2015  . Cervical cancer, FIGO stage IB1 ([place]) 01/19/2015  . Cervical cancer [facility])     Past Surgical History:  Procedure Laterality Date  . ABDOMINAL HYSTERECTOMY  2017  . APPENDECTOMY  1993  . CERVICAL CONE BIOPSY     x 6  . CERVICAL CONIZATION W/BX N/A 12/08/2014   Procedure: COLD KNIFE CONIZATION OF CERVIX;  Surgeon: [name], MD;  [facility]: [facility];  Service: Gynecology;  Laterality: N/A;  . CESAREAN SECTION     x 2  . CHOLECYSTECTOMY  2009  . DILATION AND CURETTAGE OF UTERUS    . LYMPH NODE DISSECTION N/A 06/01/2016   Procedure: RIGHT INGUNAL LYMPH NODE DISSECTION WITH A JP DRAIN;  Surgeon: [name], MD;  [facility];  Service: Gynecology;  Laterality: N/A;  . PELVIC LYMPH NODE DISSECTION N/A 01/19/2015   Procedure: PELVIC LYMPH NODE DISSECTION;  Surgeon: [name], MD;  Location:  [facility];  Service: Gynecology;  Laterality: N/A;  . ROBOTIC ASSISTED TOTAL HYSTERECTOMY N/A 01/19/2015   Procedure: ROBOTIC ASSISTED RADICAL HYSTERECTOMY RIGHT TUBE AND OVARY, LEFT TUBE;  Surgeon: [name], MD;  [facility];  Service: Gynecology;  Laterality: N/A;  . SHOULDER SURGERY Left   . TONSILLECTOMY      OB History    Gravida Para Term Preterm AB Living   2 2 2          SAB TAB Ectopic Multiple Live Births                   Home Medications    Prior to Admission medications   Medication Sig Start Date End Date Taking? Authorizing  Provider  albuterol (ACCUNEB) 0.63 MG/3ML nebulizer solution Take 1 ampule by nebulization every 6 (six) hours as needed for Wheezing.    Historical Provider, MD  albuterol (PROVENTIL HFA;VENTOLIN HFA) 108 (90 Base) MCG/ACT inhaler Inhale 2 puffs into the lungs once. 09/11/15   Blanchie Dessert, MD  albuterol (PROVENTIL HFA;VENTOLIN HFA) 108 (90 Base) MCG/ACT inhaler Inhale 2 puffs into the lungs every 4 (four) hours as needed for wheezing or shortness of breath. 09/24/16   Forde Dandy, MD  amLODipine (NORVASC) 10 MG tablet Take 1 tablet (10 mg total) by mouth daily. 09/07/16   Flossie Buffy, NP  HYDROmorphone (DILAUDID) 4 MG tablet Take 1 tablet (4 mg total) by mouth every 4 (four) hours as needed for severe pain. 09/04/16   Orlie Dakin, MD  levothyroxine (SYNTHROID, LEVOTHROID) 50 MCG tablet Take 1 tablet (50 mcg total) by mouth daily before breakfast. 08/10/16   Flossie Buffy, NP  lisinopril-hydrochlorothiazide (PRINZIDE,ZESTORETIC) 20-12.5 MG tablet Take 1 tablet by mouth daily. 09/07/16   Flossie Buffy, NP  omeprazole (PRILOSEC) 20 MG capsule Take 1 capsule (20 mg total) by mouth daily. 08/01/16   Flossie Buffy, NP  sertraline (ZOLOFT) 50 MG tablet Take 1 tablet (50 mg total) by mouth daily. 09/07/16   Flossie Buffy, NP  spironolactone (ALDACTONE) 25 MG tablet Take 1 tablet (25 mg total) by mouth daily. 09/22/16 12/21/16  Thayer Headings, MD    Family History Family History  Problem Relation Age of Onset  . Cancer Mother 33    lymphoma, stomach  . Hypertension Mother   . Diabetes Mother   . CAD Mother   . Diabetes Other   . Hypertension Other   . Cancer Other   . CAD Other   . Heart disease Maternal Aunt   . Hypertension Maternal Aunt     Social History Social History  Substance Use Topics  . Smoking status: Current Every Day Smoker    Packs/day: 2.00    Years: 24.00    Types: Cigarettes  . Smokeless tobacco: Never Used  . Alcohol use Yes     Comment: social       Allergies   Other; Acetaminophen; Erythromycin; Penicillins; Doxycycline; Erythromycin base; and Motrin [ibuprofen]   Review of Systems Review of Systems  Constitutional: Negative for fever.  HENT: Negative for congestion and rhinorrhea.   Respiratory: Positive for shortness of breath.   Cardiovascular: Positive for chest pain.  Gastrointestinal: Negative for abdominal pain.  Genitourinary: Negative for difficulty urinating.  Allergic/Immunologic: Negative for immunocompromised state.  Neurological: Negative for syncope.  Hematological: Does not bruise/bleed easily.  Psychiatric/Behavioral: Negative for confusion.  All other systems reviewed and are negative.    Physical Exam Updated Vital Signs BP Marland Kitchen)  149/92   Pulse 85   Temp 98.2 F (36.8 C) (Oral)   Resp 16   LMP 01/19/2015 Comment: started after last surgery  SpO2 99%   Physical Exam Physical Exam  Nursing note and vitals reviewed. Constitutional: Well developed, well nourished, non-toxic, and in no acute distress Head: Normocephalic and atraumatic.  Mouth/Throat: Oropharynx is clear and moist.  Neck: Normal range of motion. Neck supple.  Cardiovascular: Normal rate and regular rhythm.   Pulmonary/Chest: Effort normal. No conversational dyspnea. Scattered inspiratory and expiratory wheezing throughout. Abdominal: Soft. There is no tenderness. There is no rebound and no guarding.  Musculoskeletal: Normal range of motion.  no lower extremity edema. No calf tenderness.  Neurological: Alert, no facial droop, fluent speech, moves all extremities symmetrically Skin: Skin is warm and dry.  Psychiatric: Cooperative   ED Treatments / Results  Labs (all labs ordered are listed, but only abnormal results are displayed) Labs Reviewed  BASIC METABOLIC PANEL - Abnormal; Notable for the following:       Result Value   BUN <5 (*)    All other components within normal limits  CBC WITH DIFFERENTIAL/PLATELET  I-STAT  TROPOININ, ED  I-STAT BETA HCG BLOOD, ED ([facility], [facility], [facility] ONLY)    EKG  EKG Interpretation  Date/Time:  Sunday September 24 2016 17:12:38 EDT Ventricular Rate:  67 PR Interval:    QRS Duration: 96 QT Interval:  424 QTC Calculation: 448 R Axis:   72 Text Interpretation:  Sinus rhythm Borderline T abnormalities, anterior leads no acute changes Confirmed by Nameer Summer MD, Marionna Gonia [phone]) on 09/24/2016 5:59:22 PM       Radiology Dg Chest 2 View  Result Date: 09/24/2016 CLINICAL DATA:  Cough, shortness of breath. EXAM: CHEST  2 VIEW COMPARISON:  Radiographs of March 13, 2016. FINDINGS: The heart size and mediastinal contours are within normal limits. Both lungs are clear. No pneumothorax or pleural effusion is noted. The visualized skeletal structures are unremarkable. IMPRESSION: No active cardiopulmonary disease. Electronically Signed   By: Marijo Conception, M.D.   On: 09/24/2016 17:17    Procedures Procedures (including critical care time)  Medications Ordered in ED Medications  ipratropium-albuterol (DUONEB) 0.5-2.5 (3) MG/3ML nebulizer solution 3 mL (3 mLs Nebulization Given 09/24/16 1712)  dexamethasone (DECADRON) tablet 10 mg (10 mg Oral Given 09/24/16 1712)  albuterol (PROVENTIL HFA;VENTOLIN HFA) 108 (90 Base) MCG/ACT inhaler 4 puff (4 puffs Inhalation Given 09/24/16 1823)  ipratropium-albuterol (DUONEB) 0.5-2.5 (3) MG/3ML nebulizer solution 3 mL (3 mLs Nebulization Given 09/24/16 1823)     Initial Impression / Assessment and Plan / ED Course  I have reviewed the triage vital signs and the nursing notes.  Pertinent labs & imaging results that were available during my care of the patient were reviewed by me and considered in my medical decision making (see chart for details).     Present shortness of breath and wheezing which she states is consistent with her asthma. She is well-appearing in no acute distress without significant increased work of breathing. With normal oxygenation on room  air. Wheezing throughout all lung fields on exam. Does not look fluid overloaded. Given breathing treatments and Decadron, with significant improvement in her breathing and clearance of wheezing in her lungs. Chest x-ray visualized and shows no pneumonia, edema, or other acute cardiopulmonary processes. EKG is nonischemic and troponin 1 is negative. No significant risk factors for ACS, and given response to treatment for asthma, is not concerning for ACS. Doubt PE as well.  PERC negative. Sent home with albuterol inhaler.    Smoking cessation discussed. Offered resources.  The patient appears reasonably screened and/or stabilized for discharge and I doubt any other medical condition or other [facility] requiring further screening, evaluation, or treatment in the ED at this time prior to discharge.  Strict return and follow-up instructions reviewed. She expressed understanding of all discharge instructions and felt comfortable with the plan of care.   Final Clinical Impressions(s) / ED Diagnoses   Final diagnoses:  Mild intermittent asthma with acute exacerbation  Encounter for tobacco use cessation counseling    New Prescriptions Discharge Medication List as of 09/24/2016  6:35 PM    START taking these medications   Details  !! albuterol (PROVENTIL HFA;VENTOLIN HFA) 108 (90 Base) MCG/ACT inhaler Inhale 2 puffs into the lungs every 4 (four) hours as needed for wheezing or shortness of breath., Starting Sun 09/24/2016, Print    dexamethasone (DECADRON) 6 MG tablet Take 2 tablets (12 mg total) by mouth once. If shortness of breath and wheezing persistent after 2-3 days, take 2 tablets by mouth once., Starting Sun 09/24/2016, Print     !! - Potential duplicate medications found. Please discuss with provider.       Forde Dandy, MD 09/24/16 [phone]    Forde Dandy, MD 09/25/16 [phone]

## 2016-09-26 ENCOUNTER — Ambulatory Visit (INDEPENDENT_AMBULATORY_CARE_PROVIDER_SITE_OTHER): Payer: Medicare Other | Admitting: Endocrinology

## 2016-09-26 ENCOUNTER — Encounter: Payer: Self-pay | Admitting: Endocrinology

## 2016-09-26 VITALS — BP 126/88 | HR 66 | Ht 66.0 in | Wt 196.0 lb

## 2016-09-26 DIAGNOSIS — E039 Hypothyroidism, unspecified: Secondary | ICD-10-CM

## 2016-09-26 LAB — TSH: TSH: 31.26 u[IU]/mL — ABNORMAL HIGH (ref 0.35–4.50)

## 2016-09-26 MED ORDER — LEVOTHYROXINE SODIUM 125 MCG PO TABS: 125.0000 ug | ORAL_TABLET | Freq: Every day | ORAL | 5 refills | Status: AC

## 2016-09-26 NOTE — Patient Instructions (Addendum)
You should have the difficulty swallowing checked as a separate problem, as it is different from the thyroid.  blood tests are requested for you today.  We'll let you know about the results. I am happy to do the biopsy if you want.  It is easy.   Please come back for a follow-up appointment in 2 months.

## 2016-09-26 NOTE — Progress Notes (Signed)
Subjective:    Patient ID: Melissa Alvarez, female    DOB: 02/16/1976, 41 y.o.   MRN: [id number]  HPI Pt is referred by for Wilfred Lacy, NP, hypothyroidism.  Pt reports hypothyroidism was dx'ed in 2017.  she has been on prescribed thyroid hormone therapy since then.  she has never taken kelp or any other type of non-prescribed thyroid product.  She has had TAH.  she has never had thyroid surgery, or XRT to the neck.  she has never been on amiodarone or lithium.  She has slight swelling at the anterior neck, and assoc pain.   Past Medical History:  Diagnosis Date  . Anemia   . Asthma   . Cancer ([place])    cervical  . COPD (chronic obstructive pulmonary disease) ([place])   . Depression   . Difficulty sleeping   . Difficulty sleeping   . Family history of adverse reaction to anesthesia    pts daughter and pts mother awaken during surgery;   Marland Kitchen Gastric ulcer    x 5  . History of blood transfusion    last one 2 years ago   . Numbness and tingling    right hip area following June 2016 surgery pt states has improved   . Postoperative nausea and vomiting 01/21/2015  . Shortness of breath dyspnea    exertion     Past Surgical History:  Procedure Laterality Date  . ABDOMINAL HYSTERECTOMY  2017  . APPENDECTOMY  1993  . CERVICAL CONE BIOPSY     x 6  . CERVICAL CONIZATION W/BX N/A 12/08/2014   Procedure: COLD KNIFE CONIZATION OF CERVIX;  Surgeon: [name], MD;  Location: [facility];  Service: Gynecology;  Laterality: N/A;  . CESAREAN SECTION     x 2  . CHOLECYSTECTOMY  2009  . DILATION AND CURETTAGE OF UTERUS    . LYMPH NODE DISSECTION N/A 06/01/2016   Procedure: RIGHT INGUNAL LYMPH NODE DISSECTION WITH A JP DRAIN;  Surgeon: [name], MD;  [facility];  Service: Gynecology;  Laterality: N/A;  . PELVIC LYMPH NODE DISSECTION N/A 01/19/2015   Procedure: PELVIC LYMPH NODE DISSECTION;  Surgeon: [name], MD;  [facility]: [facility];  Service: Gynecology;  Laterality: N/A;  . ROBOTIC  ASSISTED TOTAL HYSTERECTOMY N/A 01/19/2015   Procedure: ROBOTIC ASSISTED RADICAL HYSTERECTOMY RIGHT TUBE AND OVARY, LEFT TUBE;  Surgeon: [name], MD;  [facility];  Service: Gynecology;  Laterality: N/A;  . SHOULDER SURGERY Left   . TONSILLECTOMY      Social History   Social History  . Marital status: Single    Spouse name: N/A  . Number of children: N/A  . Years of education: N/A   Occupational History  . Not on file.   Social History Main Topics  . Smoking status: Current Every Day Smoker    Packs/day: 2.00    Years: 24.00    Types: Cigarettes  . Smokeless tobacco: Never Used  . Alcohol use Yes     Comment: social  . Drug use: Yes    Types: Marijuana     Comment: last use 05/02/2016  . Sexual activity: No   Other Topics Concern  . Not on file   Social History Narrative  . No narrative on file    Current Outpatient Prescriptions on File Prior to Visit  Medication Sig Dispense Refill  . albuterol (ACCUNEB) 0.63 MG/3ML nebulizer solution Take 1 ampule by nebulization every 6 (six) hours as needed for Wheezing.    Marland Kitchen  albuterol (PROVENTIL HFA;VENTOLIN HFA) 108 (90 Base) MCG/ACT inhaler Inhale 2 puffs into the lungs once. 1 Inhaler 1  . albuterol (PROVENTIL HFA;VENTOLIN HFA) 108 (90 Base) MCG/ACT inhaler Inhale 2 puffs into the lungs every 4 (four) hours as needed for wheezing or shortness of breath. 1 Inhaler 3  . amLODipine (NORVASC) 10 MG tablet Take 1 tablet (10 mg total) by mouth daily. 90 tablet 1  . HYDROmorphone (DILAUDID) 4 MG tablet Take 1 tablet (4 mg total) by mouth every 4 (four) hours as needed for severe pain. 20 tablet 0  . lisinopril-hydrochlorothiazide (PRINZIDE,ZESTORETIC) 20-12.5 MG tablet Take 1 tablet by mouth daily. 90 tablet 1  . omeprazole (PRILOSEC) 20 MG capsule Take 1 capsule (20 mg total) by mouth daily. 30 capsule 3  . sertraline (ZOLOFT) 50 MG tablet Take 1 tablet (50 mg total) by mouth daily. 90 tablet 1  . spironolactone (ALDACTONE) 25  MG tablet Take 1 tablet (25 mg total) by mouth daily. 30 tablet 11   No current facility-administered medications on file prior to visit.     Allergies  Allergen Reactions  . Other Hives and Anaphylaxis    Steroids. Can take solumedrol by IV Clinical steroids except for solu medrol  . Acetaminophen Hives and Rash    With codeine States she can take percocet   . Erythromycin Hives, Itching and Rash  . Penicillins Hives, Itching and Rash    Has patient had a PCN reaction causing immediate rash, facial/tongue/throat swelling, SOB or lightheadedness with hypotension: Yes Has patient had a PCN reaction causing severe rash involving mucus membranes or skin necrosis: No Has patient had a PCN reaction that required hospitalization No Has patient had a PCN reaction occurring within the last 10 years: No  If all of the above answers are "NO", then may proceed with Cephalosporin use.   Marland Kitchen Doxycycline Hives, Itching and Rash    Other reaction(s): Other (See Comments) Unsure  . Erythromycin Base Rash  . Motrin [Ibuprofen] Hives, Itching and Rash    Family History  Problem Relation Age of Onset  . Cancer Mother 9    lymphoma, stomach  . Hypertension Mother   . Diabetes Mother   . CAD Mother   . Thyroid cancer Mother     unknown cell type  . Diabetes Other   . Hypertension Other   . Cancer Other   . CAD Other   . Heart disease Maternal Aunt   . Hypertension Maternal Aunt     BP 126/88   Pulse 66   Ht 5\' 6"  (1.676 m)   Wt 196 lb (88.9 kg)   LMP 01/19/2015 Comment: started after last surgery  SpO2 97%   BMI 31.64 kg/m   Review of Systems denies depression, muscle cramps, sob, weight gain, constipation, numbness, blurry vision, cold intolerance, myalgias, rhinorrhea, and syncope.  She has slight solid dysphagia, easy bruising,  and dry skin.    Objective:   Physical Exam VS: see vs page GEN: no distress HEAD: head: no deformity eyes: no periorbital swelling, no  proptosis external nose and ears are normal mouth: no lesion seen NECK: supple, thyroid is approx 4 times normal size. No discrete nodule is palpable CHEST WALL: no deformity LUNGS: clear to auscultation CV: reg rate and rhythm, no murmur ABD: abdomen is soft, nontender.  no hepatosplenomegaly.  not distended.  no hernia MUSCULOSKELETAL: muscle bulk and strength are grossly normal.  no obvious joint swelling.  gait is normal and steady EXTEMITIES:  no deformity.  no ulcer on the feet.  feet are of normal color and temp.  no edema PULSES: dorsalis pedis intact bilat.  no carotid bruit.  NEURO:  cn 2-12 grossly intact.   readily moves all 4's.  sensation is intact to touch on the feet.  SKIN:  Normal texture and temperature.  No rash or suspicious lesion is visible.   NODES:  None palpable at the neck.  PSYCH: alert, well-oriented.  Does not appear anxious nor depressed.    Lab Results  Component Value Date   TSH 31.26 (H) 09/26/2016   I have reviewed outside records, and summarized: Pt was noted to have goiter, and referred here.  Mail problem was HTN, so she was ref to cardiol.  elev TSH was also noted then.   [place]: Diffusely heterogeneous enlarged thyroid gland without a discrete nodule or focal abnormality, nonspecific.  Mildly prominent cervical lymph nodes, suspect reactive.     Assessment & Plan:  Hypothyroidism: she needs increased rx. I have sent a prescription to your pharmacy. Dysphagia, new to me: not thyroid-related.  Goiter: nonspecific.  Generally not an indication for bx, but I offered to do.   Patient is advised the following: Patient Instructions  You should have the difficulty swallowing checked as a separate problem, as it is different from the thyroid.  blood tests are requested for you today.  We'll let you know about the results. I am happy to do the biopsy if you want.  It is easy.   Please come back for a follow-up appointment in 2 months.

## 2016-10-04 ENCOUNTER — Other Ambulatory Visit (HOSPITAL_COMMUNITY): Payer: Medicare Other

## 2016-10-04 ENCOUNTER — Other Ambulatory Visit: Payer: Medicare Other

## 2016-10-04 ENCOUNTER — Telehealth (HOSPITAL_COMMUNITY): Payer: Self-pay | Admitting: Cardiovascular Disease

## 2016-10-06 ENCOUNTER — Encounter: Payer: Self-pay | Admitting: Endocrinology

## 2016-10-09 NOTE — Telephone Encounter (Signed)
  10/04/2016 08:59 AM Phone (Outgoing) Strassman, Katiya Fike (Self) [phone] (H)   Left Message - Called pt and lmsg for her to r/s echo due to tech having a death in the family.    By Verdene Rio

## 2016-10-17 ENCOUNTER — Other Ambulatory Visit: Payer: Medicare Other

## 2016-10-17 ENCOUNTER — Other Ambulatory Visit: Payer: Self-pay

## 2016-10-17 ENCOUNTER — Ambulatory Visit (HOSPITAL_COMMUNITY): Payer: Medicare Other | Attending: Cardiovascular Disease

## 2016-10-17 DIAGNOSIS — I119 Hypertensive heart disease without heart failure: Secondary | ICD-10-CM

## 2016-10-17 DIAGNOSIS — Z8249 Family history of ischemic heart disease and other diseases of the circulatory system: Secondary | ICD-10-CM | POA: Diagnosis not present

## 2016-10-17 DIAGNOSIS — I10 Essential (primary) hypertension: Secondary | ICD-10-CM

## 2016-10-17 DIAGNOSIS — J449 Chronic obstructive pulmonary disease, unspecified: Secondary | ICD-10-CM | POA: Insufficient documentation

## 2016-10-17 LAB — BASIC METABOLIC PANEL
BUN / CREAT RATIO: 8 — AB (ref 9–23)
BUN: 6 mg/dL (ref 6–24)
CO2: 21 mmol/L (ref 18–29)
Calcium: 9.1 mg/dL (ref 8.7–10.2)
Chloride: 102 mmol/L (ref 96–106)
Creatinine, Ser: 0.79 mg/dL (ref 0.57–1.00)
GFR calc Af Amer: 108 mL/min/{1.73_m2} (ref >59)
GFR calc non Af Amer: 93 mL/min/{1.73_m2} (ref >59)
GLUCOSE: 88 mg/dL (ref 65–99)
POTASSIUM: 4.4 mmol/L (ref 3.5–5.2)
SODIUM: 138 mmol/L (ref 134–144)

## 2016-10-18 ENCOUNTER — Telehealth: Payer: Self-pay | Admitting: Cardiovascular Disease

## 2016-10-18 NOTE — Telephone Encounter (Signed)
Follow Up: ° ° ° °Returning your call, concerning her lab results. °

## 2016-10-18 NOTE — Telephone Encounter (Signed)
Reviewed results of bmet and echo with patient who verbalized understanding. I advised her to periodically check her BP and call back if it is elevated. Her last ov with Dr. Loanne Drilling showed BP of 126/88.  I scheduled her for f/u with Dr. Acie Fredrickson on 6/20. She thanked me for the call.

## 2016-11-22 ENCOUNTER — Encounter (HOSPITAL_COMMUNITY): Payer: Self-pay

## 2016-11-22 ENCOUNTER — Emergency Department (HOSPITAL_COMMUNITY)
Admission: EM | Admit: 2016-11-22 | Discharge: 2016-11-22 | Disposition: A | Discharge status: Home / Self Care | Payer: Medicare Other | Attending: Emergency Medicine | Admitting: Emergency Medicine

## 2016-11-22 ENCOUNTER — Emergency Department (HOSPITAL_COMMUNITY): Payer: Medicare Other

## 2016-11-22 DIAGNOSIS — I130 Hypertensive heart and chronic kidney disease with heart failure and stage 1 through stage 4 chronic kidney disease, or unspecified chronic kidney disease: Secondary | ICD-10-CM | POA: Diagnosis not present

## 2016-11-22 DIAGNOSIS — I509 Heart failure, unspecified: Secondary | ICD-10-CM | POA: Insufficient documentation

## 2016-11-22 DIAGNOSIS — F17210 Nicotine dependence, cigarettes, uncomplicated: Secondary | ICD-10-CM | POA: Insufficient documentation

## 2016-11-22 DIAGNOSIS — S8992XA Unspecified injury of left lower leg, initial encounter: Secondary | ICD-10-CM | POA: Diagnosis not present

## 2016-11-22 DIAGNOSIS — J449 Chronic obstructive pulmonary disease, unspecified: Secondary | ICD-10-CM | POA: Insufficient documentation

## 2016-11-22 DIAGNOSIS — Z79899 Other long term (current) drug therapy: Secondary | ICD-10-CM | POA: Diagnosis not present

## 2016-11-22 DIAGNOSIS — N184 Chronic kidney disease, stage 4 (severe): Secondary | ICD-10-CM | POA: Insufficient documentation

## 2016-11-22 DIAGNOSIS — Z8541 Personal history of malignant neoplasm of cervix uteri: Secondary | ICD-10-CM | POA: Insufficient documentation

## 2016-11-22 DIAGNOSIS — E039 Hypothyroidism, unspecified: Secondary | ICD-10-CM | POA: Diagnosis not present

## 2016-11-22 DIAGNOSIS — M25562 Pain in left knee: Secondary | ICD-10-CM | POA: Insufficient documentation

## 2016-11-22 MED ORDER — METHOCARBAMOL 500 MG PO TABS
500.0000 mg | ORAL_TABLET | Freq: Once | ORAL | Status: AC
  Administered 2016-11-22: 500 mg via ORAL
  Filled 2016-11-22: qty 1

## 2016-11-22 MED ORDER — METHOCARBAMOL 500 MG PO TABS: 500.0000 mg | ORAL_TABLET | Freq: Every evening | ORAL | 0 refills | Status: AC | PRN

## 2016-11-22 MED ORDER — NAPROXEN 500 MG PO TABS: 500.0000 mg | ORAL_TABLET | Freq: Two times a day (BID) | ORAL | 0 refills | Status: AC

## 2016-11-22 MED ORDER — NAPROXEN 250 MG PO TABS
500.0000 mg | ORAL_TABLET | Freq: Once | ORAL | Status: AC
  Administered 2016-11-22: 500 mg via ORAL
  Filled 2016-11-22: qty 2

## 2016-11-22 NOTE — Discharge Instructions (Signed)
Rest - please stay off right leg as much as possible Ice - ice for 20 minutes at a time, several times a day Compression - wear brace to provide support Elevate - elevate ankle above level of heart Naproxen - take with food. Take 2 times daily Follow up with your doctor if symptoms persist

## 2016-11-22 NOTE — ED Provider Notes (Signed)
[facility] DEPT Provider Note   CSN: [id number] Arrival date & time: 11/22/16  0753     History   Chief Complaint Chief Complaint  Patient presents with  . Knee Pain    HPI Melissa Alvarez is a 41 y.o. female who presents with left knee pain. She states that the pain started 4 days ago. It is constant and severe. The pain is over the medial aspect of the knee and radiates to the patella. She reports pain is worse with bearing weight and bending her knee. She has a friend who is a nurse who has been putting creams on the knee and wrapping it with minimal relief. She denies any injury or trauma. No redness, numbness or tingling or weakness.  HPI  Past Medical History:  Diagnosis Date  . Anemia   . Asthma   . Cancer ([place])    cervical  . COPD (chronic obstructive pulmonary disease) ([place])   . Depression   . Difficulty sleeping   . Difficulty sleeping   . Family history of adverse reaction to anesthesia    pts daughter and pts mother awaken during surgery;   Marland Kitchen Gastric ulcer    x 5  . History of blood transfusion    last one 2 years ago   . Numbness and tingling    right hip area following June 2016 surgery pt states has improved   . Postoperative nausea and vomiting 01/21/2015  . Shortness of breath dyspnea    exertion     Patient Active Problem List   Diagnosis Date Noted  . 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 Mood disorder ([place]) 09/07/2016  . Anxiety 09/07/2016  . Depression, recurrent ([place]) 09/07/2016  . Hypothyroidism 08/10/2016  . Tobacco dependence 08/02/2016  . Anterior cervical lymphadenopathy 08/02/2016  . Throat fullness 08/02/2016  . Hypertensive urgency 08/01/2016  . Lymphedema of right lower extremity 10/04/2015  . Urinary retention with incomplete bladder emptying 02/22/2015  . Groin mass in female 02/22/2015  .  Postoperative nausea and vomiting 01/21/2015  . Cervical cancer, FIGO stage IB1 ([place]) 01/19/2015  . Cervical cancer [facility])     Past Surgical History:  Procedure Laterality Date  . ABDOMINAL HYSTERECTOMY  2017  . APPENDECTOMY  1993  . CERVICAL CONE BIOPSY     x 6  . CERVICAL CONIZATION W/BX N/A 12/08/2014   Procedure: COLD KNIFE CONIZATION OF CERVIX;  Surgeon: [name], MD;  Location: [facility];  Service: Gynecology;  Laterality: N/A;  . CESAREAN SECTION    . CESAREAN SECTION     x 2  . CHOLECYSTECTOMY  2009  . DILATION AND CURETTAGE OF UTERUS    . LYMPH NODE DISSECTION N/A 06/01/2016   Procedure: RIGHT INGUNAL LYMPH NODE DISSECTION WITH A JP DRAIN;  Surgeon: [name], MD;  Location: [facility];  Service: Gynecology;  Laterality: N/A;  . LYMPHADENECTOMY     groin  . PELVIC LYMPH NODE DISSECTION N/A 01/19/2015   Procedure: PELVIC LYMPH NODE DISSECTION;  Surgeon: [name], MD;  [facility];  Service: Gynecology;  Laterality: N/A;  . ROBOTIC ASSISTED TOTAL HYSTERECTOMY N/A 01/19/2015   Procedure: ROBOTIC ASSISTED RADICAL HYSTERECTOMY RIGHT TUBE AND OVARY, LEFT TUBE;  Surgeon: [name], MD;  [facility];  Service: Gynecology;  Laterality: N/A;  . SHOULDER SURGERY Left   . TONSILLECTOMY      OB  History    Gravida Para Term Preterm AB Living   2 2 2  0 0     SAB TAB Ectopic Multiple Live Births   0 0 0           Home Medications    Prior to Admission medications   Medication Sig Start Date End Date Taking? Authorizing Provider  albuterol (ACCUNEB) 0.63 MG/3ML nebulizer solution Take 1 ampule by nebulization every 6 (six) hours as needed for Wheezing.    [provider]  albuterol (PROVENTIL HFA;VENTOLIN HFA) 108 (90 Base) MCG/ACT inhaler Inhale 2 puffs into the lungs once. 09/11/15   Blanchie Dessert, MD  albuterol (PROVENTIL HFA;VENTOLIN HFA) 108 (90 Base) MCG/ACT inhaler Inhale 2 puffs into the lungs every 4 (four) hours as needed for wheezing or shortness of  breath. 09/24/16   Forde Dandy, MD  amLODipine (NORVASC) 10 MG tablet Take 1 tablet (10 mg total) by mouth daily. 09/07/16   Nche, Charlene Brooke, NP  HYDROmorphone (DILAUDID) 4 MG tablet Take 1 tablet (4 mg total) by mouth every 4 (four) hours as needed for severe pain. 09/04/16   Orlie Dakin, MD  levothyroxine (SYNTHROID, LEVOTHROID) 125 MCG tablet Take 1 tablet (125 mcg total) by mouth daily. 09/26/16   Renato Shin, MD  lisinopril-hydrochlorothiazide (PRINZIDE,ZESTORETIC) 20-12.5 MG tablet Take 1 tablet by mouth daily. 09/07/16   Nche, Charlene Brooke, NP  methocarbamol (ROBAXIN) 500 MG tablet Take 1 tablet (500 mg total) by mouth at bedtime and may repeat dose one time if needed. 11/22/16   Recardo Evangelist, PA-C  naproxen (NAPROSYN) 500 MG tablet Take 1 tablet (500 mg total) by mouth 2 (two) times daily. 11/22/16   Recardo Evangelist, PA-C  omeprazole (PRILOSEC) 20 MG capsule Take 1 capsule (20 mg total) by mouth daily. 08/01/16   Nche, Charlene Brooke, NP  sertraline (ZOLOFT) 50 MG tablet Take 1 tablet (50 mg total) by mouth daily. 09/07/16   Nche, Charlene Brooke, NP  spironolactone (ALDACTONE) 25 MG tablet Take 1 tablet (25 mg total) by mouth daily. 09/22/16 12/21/16  Nahser, Wonda Cheng, MD    Family History Family History  Problem Relation Age of Onset  . Cancer Mother 32       lymphoma, stomach  . Hypertension Mother   . Diabetes Mother   . CAD Mother   . Thyroid cancer Mother        unknown cell type  . Diabetes Other   . Hypertension Other   . Cancer Other   . CAD Other   . Heart disease Maternal Aunt   . Hypertension Maternal Aunt     Social History Social History  Substance Use Topics  . Smoking status: Current Every Day Smoker    Packs/day: 2.00    Years: 24.00    Types: Cigarettes  . Smokeless tobacco: Never Used  . Alcohol use Yes     Comment: social     Allergies   Other; Acetaminophen; Erythromycin; Penicillins; Doxycycline; Erythromycin base; and Motrin  [ibuprofen]   Review of Systems Review of Systems  Musculoskeletal: Positive for arthralgias, gait problem and joint swelling.  Neurological: Negative for weakness and numbness.     Physical Exam Updated Vital Signs BP (!) 161/90 (BP Location: Right Arm)   Pulse 60   Temp 97.8 F (36.6 C) (Oral)   Resp 16   Ht 5' 6.5" (1.689 m)   Wt 90.7 kg (200 lb)   SpO2 100%   BMI 31.80 kg/m  Physical Exam  Constitutional: She is oriented to person, place, and time. She appears well-developed and well-nourished. No distress.  HENT:  Head: Normocephalic and atraumatic.  Eyes: Conjunctivae are normal. Pupils are equal, round, and reactive to light. Right eye exhibits no discharge. Left eye exhibits no discharge. No scleral icterus.  Neck: Normal range of motion.  Cardiovascular: Normal rate.   Pulmonary/Chest: Effort normal. No respiratory distress.  Abdominal: She exhibits no distension.  Musculoskeletal:  Left knee: No obvious swelling, deformity, or warmth. Point tenderness to palpation over the medial aspect of knee. Decreased ROM due to pain. N/V intact.   Neurological: She is alert and oriented to person, place, and time.  Skin: Skin is warm and dry.  Psychiatric: She has a normal mood and affect. Her behavior is normal.  Nursing note and vitals reviewed.    ED Treatments / Results  Labs (all labs ordered are listed, but only abnormal results are displayed) Labs Reviewed - No data to display  EKG  EKG Interpretation None       Radiology Dg Knee Complete 4 Views Left  Result Date: 11/22/2016 CLINICAL DATA:  Knee pain and swelling, no known injury, initial encounter EXAM: LEFT KNEE - COMPLETE 4+ VIEW COMPARISON:  None. FINDINGS: No evidence of fracture, dislocation, or joint effusion. No evidence of arthropathy or other focal bone abnormality. Soft tissues are unremarkable. IMPRESSION: No acute abnormality noted. Electronically Signed   By: Inez Catalina M.D.   On:  11/22/2016 08:25    Procedures Procedures (including critical care time)  Medications Ordered in ED Medications  naproxen (NAPROSYN) tablet 500 mg (500 mg Oral Given 11/22/16 0945)  methocarbamol (ROBAXIN) tablet 500 mg (500 mg Oral Given 11/22/16 0945)     Initial Impression / Assessment and Plan / ED Course  I have reviewed the triage vital signs and the nursing notes.  Pertinent labs & imaging results that were available during my care of the patient were reviewed by me and considered in my medical decision making (see chart for details).  41 year old female presents with medial L knee pain. Likely pes anserine bursitis vs a tendonitis. Xray is negative. Low suspicion for septic joint (no warmth, redness, she is afebrile). Will treat with NSAIDs, muscle relaxer, knee sleeve, RICE protocol. Referral to ortho given if symptoms do not improve. Return precautions given.  Final Clinical Impressions(s) / ED Diagnoses   Final diagnoses:  Acute pain of left knee    New Prescriptions Discharge Medication List as of 11/22/2016  9:14 AM    START taking these medications   Details  methocarbamol (ROBAXIN) 500 MG tablet Take 1 tablet (500 mg total) by mouth at bedtime and may repeat dose one time if needed., Starting Wed 11/22/2016, Print    naproxen (NAPROSYN) 500 MG tablet Take 1 tablet (500 mg total) by mouth 2 (two) times daily., Starting Wed 11/22/2016, Print         Recardo Evangelist, PA-C 11/22/16 1150    Dorie Rank, MD 11/23/16 [phone]

## 2016-11-22 NOTE — ED Triage Notes (Signed)
Per Pt, Pt is coming from home with complaints of left knee pain and swelling that started on Saturday morning. Pt reports no relief with OTC medications. Worsens with movement. Denies recent travel, injury, or MVC. Swelling noted with pulses intact.

## 2016-11-28 ENCOUNTER — Ambulatory Visit: Payer: Medicare Other | Admitting: Endocrinology

## 2016-11-28 DIAGNOSIS — Z883 Allergy status to other anti-infective agents status: Secondary | ICD-10-CM | POA: Diagnosis not present

## 2016-11-28 DIAGNOSIS — E039 Hypothyroidism, unspecified: Secondary | ICD-10-CM | POA: Diagnosis not present

## 2016-11-28 DIAGNOSIS — Z886 Allergy status to analgesic agent status: Secondary | ICD-10-CM | POA: Diagnosis not present

## 2016-11-28 DIAGNOSIS — F17200 Nicotine dependence, unspecified, uncomplicated: Secondary | ICD-10-CM | POA: Diagnosis not present

## 2016-11-28 DIAGNOSIS — Z880 Allergy status to penicillin: Secondary | ICD-10-CM | POA: Diagnosis not present

## 2016-11-28 DIAGNOSIS — D649 Anemia, unspecified: Secondary | ICD-10-CM | POA: Diagnosis not present

## 2016-11-28 DIAGNOSIS — R599 Enlarged lymph nodes, unspecified: Secondary | ICD-10-CM | POA: Diagnosis not present

## 2016-11-28 DIAGNOSIS — M7989 Other specified soft tissue disorders: Secondary | ICD-10-CM | POA: Diagnosis not present

## 2016-11-28 DIAGNOSIS — R262 Difficulty in walking, not elsewhere classified: Secondary | ICD-10-CM | POA: Diagnosis not present

## 2016-11-28 DIAGNOSIS — F329 Major depressive disorder, single episode, unspecified: Secondary | ICD-10-CM | POA: Diagnosis not present

## 2016-11-28 DIAGNOSIS — Z8541 Personal history of malignant neoplasm of cervix uteri: Secondary | ICD-10-CM | POA: Diagnosis not present

## 2016-11-28 DIAGNOSIS — M79605 Pain in left leg: Secondary | ICD-10-CM | POA: Diagnosis not present

## 2016-11-28 DIAGNOSIS — M79662 Pain in left lower leg: Secondary | ICD-10-CM | POA: Diagnosis not present

## 2016-11-28 DIAGNOSIS — Z888 Allergy status to other drugs, medicaments and biological substances status: Secondary | ICD-10-CM | POA: Diagnosis not present

## 2016-11-28 DIAGNOSIS — M79652 Pain in left thigh: Secondary | ICD-10-CM | POA: Diagnosis not present

## 2016-11-28 DIAGNOSIS — C8595 Non-Hodgkin lymphoma, unspecified, lymph nodes of inguinal region and lower limb: Secondary | ICD-10-CM | POA: Diagnosis not present

## 2016-11-28 DIAGNOSIS — M25562 Pain in left knee: Secondary | ICD-10-CM | POA: Diagnosis not present

## 2016-11-28 DIAGNOSIS — E669 Obesity, unspecified: Secondary | ICD-10-CM | POA: Diagnosis not present

## 2016-11-28 DIAGNOSIS — S8992XA Unspecified injury of left lower leg, initial encounter: Secondary | ICD-10-CM | POA: Diagnosis not present

## 2016-11-28 DIAGNOSIS — Z79899 Other long term (current) drug therapy: Secondary | ICD-10-CM | POA: Diagnosis not present

## 2016-11-29 ENCOUNTER — Encounter: Payer: Self-pay | Admitting: *Deleted

## 2016-11-29 ENCOUNTER — Telehealth: Payer: Self-pay | Admitting: Endocrinology

## 2016-11-29 NOTE — Telephone Encounter (Signed)
Patient no showed today's appt. Please advise on how to follow up. °A. No follow up necessary. °B. Follow up urgent. Contact patient immediately. °C. Follow up necessary. Contact patient and schedule visit in ___ days. °D. Follow up advised. Contact patient and schedule visit in ____weeks. ° °

## 2016-11-29 NOTE — Telephone Encounter (Signed)
Please come back for a follow-up appointment in 3 months 

## 2016-11-30 ENCOUNTER — Telehealth: Payer: Self-pay | Admitting: Nurse Practitioner

## 2016-11-30 NOTE — Telephone Encounter (Signed)
Patient returning Caitlin's call to resched appt missed on 05/29 with Dr. Loanne Drilling.  I rescheduled her for Friday June 1st.   Thank you,  -LL

## 2016-11-30 NOTE — Telephone Encounter (Signed)
Called patient to get appointment rescheduled, no answer.

## 2016-12-01 ENCOUNTER — Ambulatory Visit: Payer: Medicare Other | Admitting: Endocrinology

## 2016-12-01 DIAGNOSIS — Z0289 Encounter for other administrative examinations: Secondary | ICD-10-CM

## 2016-12-04 ENCOUNTER — Telehealth: Payer: Self-pay | Admitting: Nurse Practitioner

## 2016-12-04 DIAGNOSIS — M25562 Pain in left knee: Secondary | ICD-10-CM

## 2016-12-04 NOTE — Telephone Encounter (Signed)
Ok to put in referral? Please advise.

## 2016-12-04 NOTE — Telephone Encounter (Signed)
Patient states she has a appointment with [facility] on Friday June 8. She needs a referral sent over for this appointment. Thank you.

## 2016-12-05 NOTE — Telephone Encounter (Signed)
Per pt request

## 2016-12-05 NOTE — Telephone Encounter (Signed)
Ok to enter referral but what is it for?

## 2016-12-08 DIAGNOSIS — M25561 Pain in right knee: Secondary | ICD-10-CM | POA: Diagnosis not present

## 2016-12-09 ENCOUNTER — Emergency Department (HOSPITAL_COMMUNITY): Payer: Medicare Other

## 2016-12-09 ENCOUNTER — Emergency Department (HOSPITAL_COMMUNITY)
Admission: EM | Admit: 2016-12-09 | Discharge: 2016-12-09 | Discharge status: Against Medical Advice | Payer: Medicare Other | Attending: Emergency Medicine | Admitting: Emergency Medicine

## 2016-12-09 ENCOUNTER — Encounter (HOSPITAL_COMMUNITY): Payer: Self-pay | Admitting: Emergency Medicine

## 2016-12-09 DIAGNOSIS — Z79899 Other long term (current) drug therapy: Secondary | ICD-10-CM | POA: Diagnosis not present

## 2016-12-09 DIAGNOSIS — F17210 Nicotine dependence, cigarettes, uncomplicated: Secondary | ICD-10-CM | POA: Insufficient documentation

## 2016-12-09 DIAGNOSIS — E876 Hypokalemia: Secondary | ICD-10-CM | POA: Diagnosis not present

## 2016-12-09 DIAGNOSIS — Z8541 Personal history of malignant neoplasm of cervix uteri: Secondary | ICD-10-CM | POA: Insufficient documentation

## 2016-12-09 DIAGNOSIS — I509 Heart failure, unspecified: Secondary | ICD-10-CM | POA: Diagnosis not present

## 2016-12-09 DIAGNOSIS — I130 Hypertensive heart and chronic kidney disease with heart failure and stage 1 through stage 4 chronic kidney disease, or unspecified chronic kidney disease: Secondary | ICD-10-CM | POA: Diagnosis not present

## 2016-12-09 DIAGNOSIS — J45901 Unspecified asthma with (acute) exacerbation: Secondary | ICD-10-CM | POA: Insufficient documentation

## 2016-12-09 DIAGNOSIS — J4521 Mild intermittent asthma with (acute) exacerbation: Secondary | ICD-10-CM

## 2016-12-09 DIAGNOSIS — J449 Chronic obstructive pulmonary disease, unspecified: Secondary | ICD-10-CM | POA: Insufficient documentation

## 2016-12-09 DIAGNOSIS — J45909 Unspecified asthma, uncomplicated: Secondary | ICD-10-CM | POA: Diagnosis present

## 2016-12-09 DIAGNOSIS — N184 Chronic kidney disease, stage 4 (severe): Secondary | ICD-10-CM | POA: Insufficient documentation

## 2016-12-09 DIAGNOSIS — R0602 Shortness of breath: Secondary | ICD-10-CM | POA: Diagnosis not present

## 2016-12-09 LAB — CBC WITH DIFFERENTIAL/PLATELET
BASOS PCT: 0 %
Basophils Absolute: 0 10*3/uL (ref 0.0–0.1)
EOS ABS: 0.1 10*3/uL (ref 0.0–0.7)
Eosinophils Relative: 1 %
HCT: 41.4 % (ref 36.0–46.0)
HEMOGLOBIN: 13.3 g/dL (ref 12.0–15.0)
LYMPHS ABS: 2.3 10*3/uL (ref 0.7–4.0)
Lymphocytes Relative: 19 %
MCH: 30.4 pg (ref 26.0–34.0)
MCHC: 32.1 g/dL (ref 30.0–36.0)
MCV: 94.7 fL (ref 78.0–100.0)
Monocytes Absolute: 0.4 10*3/uL (ref 0.1–1.0)
Monocytes Relative: 3 %
NEUTROS PCT: 77 %
Neutro Abs: 9.5 10*3/uL — ABNORMAL HIGH (ref 1.7–7.7)
Platelets: 209 10*3/uL (ref 150–400)
RBC: 4.37 MIL/uL (ref 3.87–5.11)
RDW: 14 % (ref 11.5–15.5)
WBC: 12.4 10*3/uL — AB (ref 4.0–10.5)

## 2016-12-09 LAB — BASIC METABOLIC PANEL
Anion gap: 7 (ref 5–15)
BUN: <5 mg/dL — ABNORMAL LOW (ref 6–20)
CHLORIDE: 107 mmol/L (ref 101–111)
CO2: 23 mmol/L (ref 22–32)
CREATININE: 0.73 mg/dL (ref 0.44–1.00)
Calcium: 8.5 mg/dL — ABNORMAL LOW (ref 8.9–10.3)
GFR calc non Af Amer: >60 mL/min (ref >60)
Glucose, Bld: 114 mg/dL — ABNORMAL HIGH (ref 65–99)
Potassium: 3.2 mmol/L — ABNORMAL LOW (ref 3.5–5.1)
SODIUM: 137 mmol/L (ref 135–145)

## 2016-12-09 MED ORDER — ALBUTEROL SULFATE (2.5 MG/3ML) 0.083% IN NEBU
INHALATION_SOLUTION | RESPIRATORY_TRACT | Status: AC
  Administered 2016-12-09: 15 mg
  Filled 2016-12-09: qty 18

## 2016-12-09 MED ORDER — ALBUTEROL (5 MG/ML) CONTINUOUS INHALATION SOLN: 15.0000 mg/h | INHALATION_SOLUTION | RESPIRATORY_TRACT | Status: DC

## 2016-12-09 MED ORDER — ALBUTEROL SULFATE HFA 108 (90 BASE) MCG/ACT IN AERS: 2.0000 | INHALATION_SPRAY | RESPIRATORY_TRACT | Status: DC | PRN

## 2016-12-09 MED ORDER — AEROCHAMBER PLUS FLO-VU LARGE MISC: 1.0000 | Freq: Once | Status: DC

## 2016-12-09 MED ORDER — POTASSIUM CHLORIDE CRYS ER 20 MEQ PO TBCR: 40.0000 meq | EXTENDED_RELEASE_TABLET | Freq: Once | ORAL | Status: DC
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
## 2016-12-09 NOTE — ED Notes (Signed)
Got patient into a gown on the monitor patient is resting
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
## 2016-12-09 NOTE — ED Triage Notes (Signed)
Pt to ED via [facility] -- from home with asthma attack-- received Mg 2gm , SoluMedrol 125mg , IV -- Albuterol 15/Atrovent 1mg  per resp treatment.  Pt has hx of asthma attacks-- on bipap and has been intubated in past in [place].

## 2016-12-19 DIAGNOSIS — M25562 Pain in left knee: Secondary | ICD-10-CM | POA: Diagnosis not present

## 2016-12-20 ENCOUNTER — Ambulatory Visit: Payer: Medicare Other | Admitting: Cardiovascular Disease

## 2016-12-22 ENCOUNTER — Encounter: Payer: Self-pay | Admitting: Cardiovascular Disease

## 2016-12-22 DIAGNOSIS — M25562 Pain in left knee: Secondary | ICD-10-CM | POA: Diagnosis not present

## 2017-01-19 DIAGNOSIS — M25562 Pain in left knee: Secondary | ICD-10-CM | POA: Diagnosis not present

## 2017-01-19 DIAGNOSIS — M25561 Pain in right knee: Secondary | ICD-10-CM | POA: Diagnosis not present

## 2017-01-25 ENCOUNTER — Ambulatory Visit
Payer: Medicare Other | Attending: Rehabilitative and Restorative Service Providers" | Admitting: Rehabilitative and Restorative Service Providers"

## 2017-02-05 ENCOUNTER — Ambulatory Visit: Payer: Medicare Other | Attending: Orthopedic Surgery | Admitting: Physical Therapy

## 2017-12-01 ENCOUNTER — Emergency Department (HOSPITAL_COMMUNITY): Payer: Medicaid Other

## 2017-12-01 ENCOUNTER — Emergency Department (HOSPITAL_COMMUNITY)
Admission: EM | Admit: 2017-12-01 | Discharge: 2017-12-01 | Disposition: A | Discharge status: Home / Self Care | Payer: Medicaid Other | Attending: Emergency Medicine | Admitting: Emergency Medicine

## 2017-12-01 ENCOUNTER — Encounter (HOSPITAL_COMMUNITY): Payer: Self-pay | Admitting: Emergency Medicine

## 2017-12-01 DIAGNOSIS — M791 Myalgia, unspecified site: Secondary | ICD-10-CM | POA: Diagnosis not present

## 2017-12-01 DIAGNOSIS — I129 Hypertensive chronic kidney disease with stage 1 through stage 4 chronic kidney disease, or unspecified chronic kidney disease: Secondary | ICD-10-CM | POA: Insufficient documentation

## 2017-12-01 DIAGNOSIS — N184 Chronic kidney disease, stage 4 (severe): Secondary | ICD-10-CM | POA: Insufficient documentation

## 2017-12-01 DIAGNOSIS — Y9389 Activity, other specified: Secondary | ICD-10-CM | POA: Diagnosis not present

## 2017-12-01 DIAGNOSIS — Y929 Unspecified place or not applicable: Secondary | ICD-10-CM | POA: Insufficient documentation

## 2017-12-01 DIAGNOSIS — F17210 Nicotine dependence, cigarettes, uncomplicated: Secondary | ICD-10-CM | POA: Diagnosis not present

## 2017-12-01 DIAGNOSIS — E039 Hypothyroidism, unspecified: Secondary | ICD-10-CM | POA: Diagnosis not present

## 2017-12-01 DIAGNOSIS — S61012A Laceration without foreign body of left thumb without damage to nail, initial encounter: Secondary | ICD-10-CM | POA: Diagnosis not present

## 2017-12-01 DIAGNOSIS — F329 Major depressive disorder, single episode, unspecified: Secondary | ICD-10-CM | POA: Diagnosis not present

## 2017-12-01 DIAGNOSIS — Z8541 Personal history of malignant neoplasm of cervix uteri: Secondary | ICD-10-CM | POA: Diagnosis not present

## 2017-12-01 DIAGNOSIS — Y998 Other external cause status: Secondary | ICD-10-CM | POA: Diagnosis not present

## 2017-12-01 DIAGNOSIS — Z9049 Acquired absence of other specified parts of digestive tract: Secondary | ICD-10-CM | POA: Diagnosis not present

## 2017-12-01 DIAGNOSIS — J45909 Unspecified asthma, uncomplicated: Secondary | ICD-10-CM | POA: Diagnosis not present

## 2017-12-01 DIAGNOSIS — Z79899 Other long term (current) drug therapy: Secondary | ICD-10-CM | POA: Diagnosis not present

## 2017-12-01 MED ORDER — OXYCODONE HCL 5 MG PO TABS: 5.0000 mg | ORAL_TABLET | Freq: Once | ORAL | Status: DC

## 2017-12-01 MED ORDER — TRAMADOL HCL 50 MG PO TABS: 50.0000 mg | ORAL_TABLET | Freq: Four times a day (QID) | ORAL | 0 refills | Status: AC | PRN

## 2017-12-01 MED ORDER — LORAZEPAM 1 MG PO TABS
1.0000 mg | ORAL_TABLET | Freq: Once | ORAL | Status: AC
  Administered 2017-12-01: 1 mg via ORAL
  Filled 2017-12-01: qty 1

## 2017-12-01 MED ORDER — LIDOCAINE HCL (PF) 1 % IJ SOLN
10.0000 mL | Freq: Once | INTRAMUSCULAR | Status: AC
Start: 2017-12-01 — End: 2017-12-01
  Administered 2017-12-01: 10 mL
  Filled 2017-12-01: qty 10

## 2017-12-01 MED ORDER — TRAMADOL HCL 50 MG PO TABS
50.0000 mg | ORAL_TABLET | Freq: Once | ORAL | Status: AC
  Administered 2017-12-01: 50 mg via ORAL
  Filled 2017-12-01: qty 1

## 2017-12-01 NOTE — ED Notes (Signed)
Pt verbalized understanding discharge instructions and denies any further needs or questions at this time. VS stable, ambulatory and steady gait.   

## 2017-12-01 NOTE — ED Notes (Signed)
Soaking Pt's hand.
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
## 2017-12-10 ENCOUNTER — Ambulatory Visit: Payer: Medicaid Other | Admitting: Family Medicine

## 2017-12-11 ENCOUNTER — Ambulatory Visit (HOSPITAL_COMMUNITY)
Admission: EM | Admit: 2017-12-11 | Discharge: 2017-12-11 | Disposition: A | Discharge status: Home / Self Care | Payer: Medicaid Other | Attending: Family Medicine | Admitting: Family Medicine

## 2017-12-11 DIAGNOSIS — L089 Local infection of the skin and subcutaneous tissue, unspecified: Secondary | ICD-10-CM | POA: Diagnosis not present

## 2017-12-11 DIAGNOSIS — S61012D Laceration without foreign body of left thumb without damage to nail, subsequent encounter: Secondary | ICD-10-CM

## 2017-12-11 DIAGNOSIS — M79645 Pain in left finger(s): Secondary | ICD-10-CM

## 2017-12-11 DIAGNOSIS — T148XXA Other injury of unspecified body region, initial encounter: Principal | ICD-10-CM

## 2017-12-11 MED ORDER — SULFAMETHOXAZOLE-TRIMETHOPRIM 800-160 MG PO TABS: 1.0000 | ORAL_TABLET | Freq: Two times a day (BID) | ORAL | 0 refills | Status: AC

## 2017-12-11 NOTE — Discharge Instructions (Addendum)
Keep clean and dry Wear splint Keep loosely bandaged Return in 3 days for suture removal/wound check

## 2017-12-11 NOTE — ED Triage Notes (Addendum)
Pt here for suture removal in her left thumb.  Thumb is very tender to the touch and she complains of numbness in the one side.  There has been bleeding from the wound, it does not appear to be approximated in the center, but it is too painful to the touch to assess appropriately.  There is also redness and some swelling around the wound.  Dr. Meda Coffee called to bedside to further assess the wound.

## 2017-12-11 NOTE — ED Notes (Signed)
Non adherent dressing placed on top of wound.  Finger splint applied with gauze and coband.
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
## 2018-10-15 ENCOUNTER — Telehealth: Payer: Self-pay | Admitting: Nurse Practitioner

## 2018-10-15 NOTE — Telephone Encounter (Signed)
Called patient to make follow up appointment with [facility]. Phone line was busy.

## 2019-07-25 ENCOUNTER — Telehealth: Payer: Self-pay | Admitting: *Deleted

## 2019-07-25 NOTE — Telephone Encounter (Signed)
Records faxed to [facility] to [facility] - release [phone]

## 2020-05-03 DEATH — deceased
